# Patient Record
Sex: Male | Born: 1965
Health system: Southern US, Community
[De-identification: ages and names within clinical notes are randomized; demographics above are authoritative.]

## PROBLEM LIST (undated history)

## (undated) DIAGNOSIS — G4730 Sleep apnea, unspecified: Secondary | ICD-10-CM

## (undated) DIAGNOSIS — C61 Malignant neoplasm of prostate: Secondary | ICD-10-CM

## (undated) DIAGNOSIS — I10 Essential (primary) hypertension: Secondary | ICD-10-CM

## (undated) HISTORY — DX: Sleep apnea, unspecified: G47.30

## (undated) HISTORY — PX: PROSTATE BIOPSY: SHX241

---

## 1999-08-06 ENCOUNTER — Emergency Department (HOSPITAL_COMMUNITY): Admission: EM | Admit: 1999-08-06 | Discharge: 1999-08-06 | Payer: Self-pay | Admitting: Emergency Medicine

## 2004-07-11 ENCOUNTER — Ambulatory Visit (HOSPITAL_BASED_OUTPATIENT_CLINIC_OR_DEPARTMENT_OTHER): Admission: RE | Admit: 2004-07-11 | Discharge: 2004-07-11 | Payer: Self-pay | Admitting: Otolaryngology

## 2006-12-05 ENCOUNTER — Emergency Department (HOSPITAL_COMMUNITY): Admission: EM | Admit: 2006-12-05 | Discharge: 2006-12-05 | Payer: Self-pay | Admitting: Emergency Medicine

## 2010-05-07 ENCOUNTER — Encounter: Payer: Self-pay | Admitting: Pulmonary Disease

## 2010-05-07 DIAGNOSIS — G4733 Obstructive sleep apnea (adult) (pediatric): Secondary | ICD-10-CM | POA: Insufficient documentation

## 2010-05-10 ENCOUNTER — Telehealth: Payer: Self-pay | Admitting: Pulmonary Disease

## 2011-01-20 NOTE — Progress Notes (Signed)
Summary: nos appt  Phone Note Call from Patient   Caller: [email or web address]  Call For: clance Summary of Call: LMTCB x2 to rsc nos from 5/20. Initial call taken by: Darletta Moll,  May 10, 2010 2:44 PM

## 2011-01-20 NOTE — Miscellaneous (Signed)
Summary: Orders Update  Clinical Lists Changes  Orders: Added new Service order of No Show NS50 (NS50) - Signed 

## 2011-05-06 NOTE — Procedures (Signed)
NAME:  Bobby Mcguire, Bobby Mcguire             ACCOUNT NO.:  [id number]   MEDICAL RECORD NO.:  [id number]          PATIENT TYPE:  OUT   LOCATION:  SLEEP CENTER                 FACILITY:  [facility]   PHYSICIAN:  Clinton D. Maple Hudson, M.D. DATE OF BIRTH:  06/20/1966   DATE OF ADMISSION:  07/11/2004  DATE OF DISCHARGE:  07/11/2004                              NOCTURNAL POLYSOMNOGRAM   REFERRING PHYSICIAN:  Hermelinda Medicus, M.D.   INDICATIONS FOR STUDY:  Hypersomnia with sleep apnea.   EPWORTH SCORE:  5/24.   NECK SIZE:  19 inches.   BMI:  35.8.   WEIGHT:  280 pounds.   MEDICATIONS:  None listed.   SLEEP ARCHITECTURE:  Short total sleep time recorded 174 minutes for a sleep  efficiency of 46%.  This is adequate for evaluation but limited by lack of  sleep.  Patient's sleep questionnaire does not indicate a specific problem  effecting his sleep this night.  Stage I was 17%.  Stage II 76%.  Stages III  and IV were absent.  REM was 7% of total sleep time.  Latency to sleep onset  106 minutes.  Latency to REM 230 minutes.  Awake after sleep onset 93  minutes.  Arousal index was 78.4 times per hour, and most arousals were  fairly associated with respiratory events.   RESPIRATORY DATA:  NPSG protocol.  RDI 87 per hour, consistent with severe  obstructive sleep apnea/hypopnea syndrome.  There were 47 obstructive  hypopneas, 206 obstructive apneas, and one central apnea.  Events were not  positional.  RDI during REM was 91.2 per hour.   OXYGEN DATA:  Baseline room-air oxygen saturation was 97%.  Oxygen nadir was  75% during apneas with mean oxygen saturation through the study of 93-94%.  Snoring was moderate.   CARDIAC DATA:  Normal sinus rhythm.   MOVEMENT/PARASOMNIA:  Occasional body jerks caused insignificant additional  sleep disturbance.  Bathroom x2.   IMPRESSION/RECOMMENDATIONS:  1. Very severe obstructive sleep apnea/hypopnea syndrome, Respiratory     Disturbance Index 87 per hour.  2.  Moderate oxygen desaturation during apneas.  3. Normal cardiac rhythm.                                   ______________________________                                Rennis Chris. Maple Hudson, M.D.                                Diplomate, American Board of Sleep Medicine    CDY/MEDQ  D:  07/18/2004 11:24:20  T:  07/18/2004 18:22:17  Job:  [id number]

## 2014-02-25 ENCOUNTER — Ambulatory Visit (INDEPENDENT_AMBULATORY_CARE_PROVIDER_SITE_OTHER): Payer: BC Managed Care – PPO

## 2014-02-25 VITALS — BP 127/75 | HR 64 | Resp 16 | Ht 75.0 in | Wt 300.0 lb

## 2014-02-25 DIAGNOSIS — G621 Alcoholic polyneuropathy: Secondary | ICD-10-CM

## 2014-02-25 DIAGNOSIS — G629 Polyneuropathy, unspecified: Secondary | ICD-10-CM

## 2014-02-25 DIAGNOSIS — M79673 Pain in unspecified foot: Secondary | ICD-10-CM

## 2014-02-25 DIAGNOSIS — G608 Other hereditary and idiopathic neuropathies: Secondary | ICD-10-CM

## 2014-02-25 DIAGNOSIS — M79609 Pain in unspecified limb: Secondary | ICD-10-CM

## 2014-02-25 DIAGNOSIS — Q6650 Congenital pes planus, unspecified foot: Secondary | ICD-10-CM

## 2014-02-25 DIAGNOSIS — G609 Hereditary and idiopathic neuropathy, unspecified: Secondary | ICD-10-CM

## 2014-02-25 MED ORDER — MELOXICAM 15 MG PO TABS
15.0000 mg | ORAL_TABLET | Freq: Every day | ORAL | Status: DC
Start: 2014-02-25 — End: 2014-05-23

## 2014-02-25 NOTE — Patient Instructions (Signed)
ICE INSTRUCTIONS  Apply ice or cold pack to the affected area at least 3 times a day for 10-15 minutes each time.  You should also use ice after prolonged activity or vigorous exercise.  Do not apply ice longer than 20 minutes at one time.  Always keep a cloth between your skin and the ice pack to prevent burns.  Being consistent and following these instructions will help control your symptoms.  We suggest you purchase a gel ice pack because they are reusable and do bit leak.  Some of them are designed to wrap around the area.  Use the method that works best for you.  Here are some other suggestions for icing.   Use a frozen bag of peas or corn-inexpensive and molds well to your body, usually stays frozen for 10 to 20 minutes.  Wet a towel with cold water and squeeze out the excess until it's damp.  Place in a bag in the freezer for 20 minutes. Then remove and use.  Maintain a good stiff soled walking or athletic shoe.  Shoe recommendations: New balance 800 or higher, [place], [place], [place]

## 2014-02-25 NOTE — Progress Notes (Signed)
   Subjective:    Patient ID: Bobby Mcguire, male    DOB: 04/23/1966, 48 y.o.   MRN: [id number]  HPI Comments: "My feet are numb"  Patient c/o numbness through arch and toes bilateral for several months. Notices that his feet stay cold a lot. Today states that numbness is primarily in his big toes. Did get toenails x 3 removed 2 years ago by Urgent care and started to afterwards. Gotten worse recently. Supposed to see Dr Delora Fuel but had some insurance changes and never went. Went to [facility] to try orthotics, but couldn't tolerate. Stands and walks a lot with job. PCP did physical and checked glucose and was normal. Shoes feel better on.     Review of Systems  Neurological: Positive for numbness.  All other systems reviewed and are negative.       Objective:   Physical Exam Vascular status appears to be intact with pedal pulses palpable DP postal for PT +2/4 bilateral capillary refill time 3 seconds all digits skin temperature warm turgor normal there is no edema rubor pallor or varicosities neurologically epicritic and proprioceptive sensations appear to be intact on palpation patient does have some paresthesia burning or stinging sensation up in the central toes and hallux at times is actually place in the shoes sometimes worse without the shoes. Dermatologically skin color pigment normal hair growth absent nails somewhat criptotic and discolored and crit incurvated hallux nails previously been excised. Cement residual nail spicules present to orthopedic biomechanical exam rectus foot type ankle mid tarsus subtalar joint motions normal there is severe promontory changes with has valgus/pes planus deformity. There is mild DJD changes left first MTP area more so than right with a long first metatarsal decreased joint space noted on the left there is decreased range of motion dorsiflexion both hallux although this does not appear be associated with his symptomology are exacerbated  symptomology. Patient does admit to a history of alcohol use anywhere from 2-4 beers every day sometimes more than at exactly drinking a large appearing neck she them all with [name] which have a percent alcohol content. Patient [name] may have some issues with alcohol use sustained alcohol use. Patient also cases he stopped drinking couple weeks ago this was actually feeling less abnormal. X-rays otherwise unremarkable as noted again severe pes planus however there is no pain on percussion or palpation of the forefoot interspaces or in the tarsal canal innocuous tarsal tunnel symptoms related no history of back or sciatic problems       Assessment & Plan:  Assessment peripheral neuropathy possibly associated with alcohol use. May recommendations for MOBIC 50 mg twice daily samples of prescription dispensed patient will also with the alcohol stay well hydrated as alternative patient is also to wear shoes extremity worn and broken down need replacing he worsened 21 recommendations for shoes new athletic or walking shoes are recommended at this time he should also on [name] exam has some slight tinea pedis with interdigital maceration third and fourth webspaces dispensed samples of Luzu topical antifungal reappointed in one month for long-term followup for reevaluation patient been using Tinactin cream intact with break advised to continue with sprays into get new shoes alternating shoes frequently. Reappointed in one month for followup and reevaluation possible peripheral neuropathy/alcohol neuropathy.  Harriet Masson DPM

## 2014-03-25 ENCOUNTER — Ambulatory Visit: Payer: Self-pay

## 2014-04-04 ENCOUNTER — Ambulatory Visit: Payer: Self-pay

## 2014-05-23 ENCOUNTER — Other Ambulatory Visit: Payer: Self-pay

## 2014-05-26 NOTE — Telephone Encounter (Signed)
If pt continues to have problems, he needs to be seen.

## 2014-05-27 ENCOUNTER — Ambulatory Visit (INDEPENDENT_AMBULATORY_CARE_PROVIDER_SITE_OTHER): Payer: BC Managed Care – PPO | Admitting: Family Medicine

## 2014-05-27 VITALS — BP 128/80 | HR 65 | Temp 97.8°F | Resp 18 | Ht 75.0 in | Wt 302.2 lb

## 2014-05-27 DIAGNOSIS — M6530 Trigger finger, unspecified finger: Secondary | ICD-10-CM

## 2014-05-27 DIAGNOSIS — M1711 Unilateral primary osteoarthritis, right knee: Secondary | ICD-10-CM

## 2014-05-27 DIAGNOSIS — IMO0002 Reserved for concepts with insufficient information to code with codable children: Secondary | ICD-10-CM

## 2014-05-27 DIAGNOSIS — M1710 Unilateral primary osteoarthritis, unspecified knee: Secondary | ICD-10-CM

## 2014-05-27 DIAGNOSIS — M259 Joint disorder, unspecified: Secondary | ICD-10-CM

## 2014-05-27 DIAGNOSIS — E669 Obesity, unspecified: Secondary | ICD-10-CM

## 2014-05-27 LAB — COMPREHENSIVE METABOLIC PANEL
ALT: 22 U/L (ref 0–53)
AST: 18 U/L (ref 0–37)
Albumin: 4.5 g/dL (ref 3.5–5.2)
Alkaline Phosphatase: 54 U/L (ref 39–117)
BUN: 11 mg/dL (ref 6–23)
CO2: 27 mEq/L (ref 19–32)
Calcium: 9.7 mg/dL (ref 8.4–10.5)
Chloride: 105 mEq/L (ref 96–112)
Creat: 1.01 mg/dL (ref 0.50–1.35)
Glucose, Bld: 88 mg/dL (ref 70–99)
Potassium: 4.4 mEq/L (ref 3.5–5.3)
Sodium: 138 mEq/L (ref 135–145)
Total Bilirubin: 0.7 mg/dL (ref 0.2–1.2)
Total Protein: 7.2 g/dL (ref 6.0–8.3)

## 2014-05-27 LAB — LIPID PANEL
Cholesterol: 191 mg/dL (ref 0–200)
HDL: 36 mg/dL — ABNORMAL LOW (ref >39)
LDL Cholesterol: 145 mg/dL — ABNORMAL HIGH (ref 0–99)
Total CHOL/HDL Ratio: 5.3 Ratio
Triglycerides: 49 mg/dL (ref <150)
VLDL: 10 mg/dL (ref 0–40)

## 2014-05-27 MED ORDER — PREDNISONE 20 MG PO TABS: ORAL_TABLET | ORAL | Status: DC

## 2014-05-27 NOTE — Patient Instructions (Addendum)
He has several choices available to deal with the sore joints (right knee): Medication, injections, surgery, or exercise. Postexercise is spinning on a bicycle or StairMaster. He only needed to 10 minutes every day to get the benefit for the arthritis.  Bite stopping drinking alcohol, he stated the first step toward weight loss. Carbohydrates or your biggest enemy.  Trigger Finger Trigger finger (digital tendinitis and stenosing tenosynovitis) is a common disorder that causes an often painful catching of the fingers or thumb. It occurs as a clicking, snapping, or locking of a finger in the palm of the hand. This is caused by a problem with the tendons that flex or bend the fingers sliding smoothly through their sheaths. The condition may occur in any finger or a couple fingers at the same time.  The finger may lock with the finger curled or suddenly straighten out with a snap. This is more common in patients with rheumatoid arthritis and diabetes. Left untreated, the condition may get worse to the point where the finger becomes locked in flexion, like making a fist, or less commonly locked with the finger straightened out. CAUSES   Inflammation and scarring that lead to swelling around the tendon sheath.  Repeated or forceful movements.  Rheumatoid arthritis, an autoimmune disease that affects joints.  Gout.  Diabetes mellitus. SIGNS AND SYMPTOMS  Soreness and swelling of your finger.  A painful clicking or snapping as you bend and straighten your finger. DIAGNOSIS  Your health care provider will do a physical exam of your finger to diagnose trigger finger. TREATMENT   Splinting for 6 8 weeks may be helpful.  Nonsteroidal anti-inflammatory medicines (NSAIDs) can help to relieve the pain and inflammation.  Cortisone injections, along with splinting, may speed up recovery. Several injections may be required. Cortisone may give relief after one injection.  Surgery is another treatment  that may be used if conservative treatments do not work. Surgery can be minor, without incisions (a cut does not have to be made), and can be done with a needle through the skin.  Other surgical choices involve an open procedure in which the surgeon opens the hand through a small incision and cuts the pulley so the tendon can again slide smoothly. Your hand will still work fine. HOME CARE INSTRUCTIONS  Apply ice to the injured area, twice per day:  Put ice in a plastic bag.  Place a towel between your skin and the bag.  Leave the ice on for 20 minutes, 3 4 times a day.  Rest your hand often. MAKE SURE YOU:   Understand these instructions.  Will watch your condition.  Will get help right away if you are not doing well or get worse. Document Released: 09/24/2004 Document Revised: 08/07/2013 Document Reviewed: 05/07/2013 [facility] Patient Information 2014 [place].

## 2014-05-27 NOTE — Progress Notes (Signed)
° °  Subjective:  This chart was scribed for Robyn Haber, MD  by Stacy Gardner, [facility] Scribe. The patient was seen in room 10 and the patient's care was started at 9:04 AM.  Patient ID: Bobby Mcguire, male    DOB: 05-28-1966, 48 y.o.   MRN: [id number]  Hyperlipidemia Associated symptoms include myalgias.  Hand Pain   Arthritis He complains of joint swelling.   HPI Comments: Bobby Mcguire is a 48 y.o. male who arrives to the [facility] complaining of left thumb pain. He feels as though his joint is "catching" near the DIP. The pain is worse with bending and use of the joint. Nothing seems to help.   He also complains of right knee pain and swelling. Pt tried Meloxicam in the past for knee pain but he does not like the medicine.  Pt was told that he has a hx of advanced arthritis. 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 He reports working on a Ipad often. Pt was told that he is overweight and is trying to lose weight. He exercises via lifting weights and getting in sauna. Pt reports that he stop drinking and is eating healthier to lose weight.  He had a full physical last year.  Pt requests cholesterol test. He   Review of Systems  Musculoskeletal: Positive for arthralgias, arthritis, joint swelling and myalgias.       Objective:   Physical Exam  Filed Vitals:   05/27/14 0834  Pulse: 65  Temp: 97.8 F (36.6 C)  TempSrc: Oral  Resp: 18  Height: 6\' 3"  (1.905 m)  Weight: 302 lb 3.2 oz (137.077 kg)  SpO2: 99%    DIAGNOSTIC STUDIES: Oxygen Saturation is 99% on room air, normal by my interpretation.    COORDINATION OF CARE:  9:06 AM Discussed course of care with pt which includes . Advised pt to try to lose weight by doing activities like swimming, StairMaster, crunches, and spinning.  Recommended pt to refrain from carbohydrates, unhealthy starches and excessive sugar.The ideal weight to strive for is 250 lb.  Pt  understands and agrees.  Patient has no deformity of the left thumb. He does have slight catch at both the PIP and MCP joint  The right knee has a small amount of effusion but he has full range of motion. No point tenderness. Is no bony abnormality of the knee.  We spent 10-15 minutes discussing his general health and the need for losing weight.       Assessment & Plan:  Obesity, unspecified - Plan: Comprehensive metabolic panel, Lipid panel  Arthritis of knee, right - Plan: Comprehensive metabolic panel, Lipid panel, predniSONE (DELTASONE) 20 MG tablet  Trigger finger, acquired - Plan: predniSONE (DELTASONE) 20 MG tablet  Signed, Robyn Haber, MD

## 2014-06-23 ENCOUNTER — Other Ambulatory Visit: Payer: Self-pay | Admitting: Podiatry

## 2014-06-23 ENCOUNTER — Other Ambulatory Visit: Payer: Self-pay | Admitting: *Deleted

## 2014-07-02 ENCOUNTER — Telehealth: Payer: Self-pay

## 2014-07-02 DIAGNOSIS — N529 Male erectile dysfunction, unspecified: Secondary | ICD-10-CM

## 2014-07-02 NOTE — Telephone Encounter (Signed)
Dr Everlene Farrier   Patient is requesting viagra.  [facility] on [address].   [phone]

## 2014-07-03 NOTE — Telephone Encounter (Signed)
I cant see in epic when I saw him . Please check

## 2014-07-03 NOTE — Telephone Encounter (Signed)
Patient of Dr. Joseph Art. This has been forwarded.

## 2014-07-04 MED ORDER — SILDENAFIL CITRATE 100 MG PO TABS: 50.0000 mg | ORAL_TABLET | Freq: Every day | ORAL | Status: DC | PRN

## 2014-07-04 NOTE — Telephone Encounter (Signed)
Patient may want to consider checking testosterone level if viagra does not work

## 2014-08-22 ENCOUNTER — Ambulatory Visit (INDEPENDENT_AMBULATORY_CARE_PROVIDER_SITE_OTHER): Payer: BC Managed Care – PPO | Admitting: Family Medicine

## 2014-08-22 VITALS — BP 138/84 | HR 76 | Temp 97.4°F | Resp 18 | Ht 75.0 in | Wt 315.2 lb

## 2014-08-22 DIAGNOSIS — M6530 Trigger finger, unspecified finger: Secondary | ICD-10-CM

## 2014-08-22 DIAGNOSIS — R05 Cough: Secondary | ICD-10-CM

## 2014-08-22 DIAGNOSIS — IMO0002 Reserved for concepts with insufficient information to code with codable children: Secondary | ICD-10-CM

## 2014-08-22 DIAGNOSIS — N529 Male erectile dysfunction, unspecified: Secondary | ICD-10-CM

## 2014-08-22 DIAGNOSIS — S6992XS Unspecified injury of left wrist, hand and finger(s), sequela: Secondary | ICD-10-CM

## 2014-08-22 DIAGNOSIS — M1711 Unilateral primary osteoarthritis, right knee: Secondary | ICD-10-CM

## 2014-08-22 DIAGNOSIS — IMO0001 Reserved for inherently not codable concepts without codable children: Secondary | ICD-10-CM

## 2014-08-22 DIAGNOSIS — M1710 Unilateral primary osteoarthritis, unspecified knee: Secondary | ICD-10-CM

## 2014-08-22 DIAGNOSIS — Z23 Encounter for immunization: Secondary | ICD-10-CM

## 2014-08-22 DIAGNOSIS — R059 Cough, unspecified: Secondary | ICD-10-CM

## 2014-08-22 DIAGNOSIS — J3489 Other specified disorders of nose and nasal sinuses: Secondary | ICD-10-CM

## 2014-08-22 DIAGNOSIS — R0981 Nasal congestion: Secondary | ICD-10-CM

## 2014-08-22 DIAGNOSIS — G4733 Obstructive sleep apnea (adult) (pediatric): Secondary | ICD-10-CM

## 2014-08-22 MED ORDER — PREDNISONE 20 MG PO TABS: ORAL_TABLET | ORAL | Status: DC

## 2014-08-22 MED ORDER — SILDENAFIL CITRATE 100 MG PO TABS: 50.0000 mg | ORAL_TABLET | Freq: Every day | ORAL | Status: DC | PRN

## 2014-08-22 MED ORDER — HYDROCODONE-HOMATROPINE 5-1.5 MG/5ML PO SYRP: 5.0000 mL | ORAL_SOLUTION | Freq: Three times a day (TID) | ORAL | Status: DC | PRN

## 2014-08-22 NOTE — Patient Instructions (Addendum)
Sleep Apnea  Sleep apnea is a sleep disorder characterized by abnormal pauses in breathing while you sleep. When your breathing pauses, the level of oxygen in your blood decreases. This causes you to move out of deep sleep and into light sleep. As a result, your quality of sleep is poor, and the system that carries your blood throughout your body (cardiovascular system) experiences stress. If sleep apnea remains untreated, the following conditions can develop:  High blood pressure (hypertension).  Coronary artery disease.  Inability to achieve or maintain an erection (impotence).  Impairment of your thought process (cognitive dysfunction). There are three types of sleep apnea: 1. Obstructive sleep apnea--Pauses in breathing during sleep because of a blocked airway. 2. Central sleep apnea--Pauses in breathing during sleep because the area of the brain that controls your breathing does not send the correct signals to the muscles that control breathing. 3. Mixed sleep apnea--A combination of both obstructive and central sleep apnea. RISK FACTORS The following risk factors can increase your risk of developing sleep apnea:  Being overweight.  Smoking.  Having narrow passages in your nose and throat.  Being of older age.  Being male.  Alcohol use.  Sedative and tranquilizer use.  Ethnicity. Among individuals younger than 35 years, African Americans are at increased risk of sleep apnea. SYMPTOMS   Difficulty staying asleep.  Daytime sleepiness and fatigue.  Loss of energy.  Irritability.  Loud, heavy snoring.  Morning headaches.  Trouble concentrating.  Forgetfulness.  Decreased interest in sex. DIAGNOSIS  In order to diagnose sleep apnea, your caregiver will perform a physical examination. Your caregiver may suggest that you take a home sleep test. Your caregiver may also recommend that you spend the night in a sleep lab. In the sleep lab, several monitors record  information about your heart, lungs, and brain while you sleep. Your leg and arm movements and blood oxygen level are also recorded. TREATMENT The following actions may help to resolve mild sleep apnea:  Sleeping on your side.   Using a decongestant if you have nasal congestion.   Avoiding the use of depressants, including alcohol, sedatives, and narcotics.   Losing weight and modifying your diet if you are overweight. There also are devices and treatments to help open your airway:  Oral appliances. These are custom-made mouthpieces that shift your lower jaw forward and slightly open your bite. This opens your airway.  Devices that create positive airway pressure. This positive pressure "splints" your airway open to help you breathe better during sleep. The following devices create positive airway pressure:  Continuous positive airway pressure (CPAP) device. The CPAP device creates a continuous level of air pressure with an air pump. The air is delivered to your airway through a mask while you sleep. This continuous pressure keeps your airway open.  Nasal expiratory positive airway pressure (EPAP) device. The EPAP device creates positive air pressure as you exhale. The device consists of single-use valves, which are inserted into each nostril and held in place by adhesive. The valves create very little resistance when you inhale but create much more resistance when you exhale. That increased resistance creates the positive airway pressure. This positive pressure while you exhale keeps your airway open, making it easier to breath when you inhale again.  Bilevel positive airway pressure (BPAP) device. The BPAP device is used mainly in patients with central sleep apnea. This device is similar to the CPAP device because it also uses an air pump to deliver continuous air pressure  through a mask. However, with the BPAP machine, the pressure is set at two different levels. The pressure when you  exhale is lower than the pressure when you inhale.  Surgery. Typically, surgery is only done if you cannot comply with less invasive treatments or if the less invasive treatments do not improve your condition. Surgery involves removing excess tissue in your airway to create a wider passage way. Document Released: 11/25/2002 Document Revised: 04/01/2013 Document Reviewed: 04/12/2012 [facility] Patient Information 2015 [place], [place]. This information is not intended to replace advice given to you by your health care provider. Make sure you discuss any questions you have with your health care provider. Allergies Allergies may happen from anything your body is sensitive to. This may be food, medicines, pollens, chemicals, and nearly anything around you in everyday life that produces allergens. An allergen is anything that causes an allergy producing substance. Heredity is often a factor in causing these problems. This means you may have some of the same allergies as your parents. Food allergies happen in all age groups. Food allergies are some of the most severe and life threatening. Some common food allergies are cow's milk, seafood, eggs, nuts, wheat, and soybeans. SYMPTOMS   Swelling around the mouth.  An itchy red rash or hives.  Vomiting or diarrhea.  Difficulty breathing. SEVERE ALLERGIC REACTIONS ARE LIFE-THREATENING. This reaction is called anaphylaxis. It can cause the mouth and throat to swell and cause difficulty with breathing and swallowing. In severe reactions only a trace amount of food (for example, peanut oil in a salad) may cause death within seconds. Seasonal allergies occur in all age groups. These are seasonal because they usually occur during the same season every year. They may be a reaction to molds, grass pollens, or tree pollens. Other causes of problems are house dust mite allergens, pet dander, and mold spores. The symptoms often consist of nasal congestion, a runny itchy  nose associated with sneezing, and tearing itchy eyes. There is often an associated itching of the mouth and ears. The problems happen when you come in contact with pollens and other allergens. Allergens are the particles in the air that the body reacts to with an allergic reaction. This causes you to release allergic antibodies. Through a chain of events, these eventually cause you to release histamine into the blood stream. Although it is meant to be protective to the body, it is this release that causes your discomfort. This is why you were given anti-histamines to feel better. If you are unable to pinpoint the offending allergen, it may be determined by skin or blood testing. Allergies cannot be cured but can be controlled with medicine. Hay fever is a collection of all or some of the seasonal allergy problems. It may often be treated with simple over-the-counter medicine such as diphenhydramine. Take medicine as directed. Do not drink alcohol or drive while taking this medicine. Check with your caregiver or package insert for child dosages. If these medicines are not effective, there are many new medicines your caregiver can prescribe. Stronger medicine such as nasal spray, eye drops, and corticosteroids may be used if the first things you try do not work well. Other treatments such as immunotherapy or desensitizing injections can be used if all else fails. Follow up with your caregiver if problems continue. These seasonal allergies are usually not life threatening. They are generally more of a nuisance that can often be handled using medicine. HOME CARE INSTRUCTIONS   If unsure what causes  a reaction, keep a diary of foods eaten and symptoms that follow. Avoid foods that cause reactions.  If hives or rash are present:  Take medicine as directed.  You may use an over-the-counter antihistamine (diphenhydramine) for hives and itching as needed.  Apply cold compresses (cloths) to the skin or take  baths in cool water. Avoid hot baths or showers. Heat will make a rash and itching worse.  If you are severely allergic:  Following a treatment for a severe reaction, hospitalization is often required for closer follow-up.  Wear a medic-alert bracelet or necklace stating the allergy.  You and your family must learn how to give adrenaline or use an anaphylaxis kit.  If you have had a severe reaction, always carry your anaphylaxis kit or EpiPen with you. Use this medicine as directed by your caregiver if a severe reaction is occurring. Failure to do so could have a fatal outcome. SEEK MEDICAL CARE IF:  You suspect a food allergy. Symptoms generally happen within 30 minutes of eating a food.  Your symptoms have not gone away within 2 days or are getting worse.  You develop new symptoms.  You want to retest yourself or your child with a food or drink you think causes an allergic reaction. Never do this if an anaphylactic reaction to that food or drink has happened before. Only do this under the care of a caregiver. SEEK IMMEDIATE MEDICAL CARE IF:   You have difficulty breathing, are wheezing, or have a tight feeling in your chest or throat.  You have a swollen mouth, or you have hives, swelling, or itching all over your body.  You have had a severe reaction that has responded to your anaphylaxis kit or an EpiPen. These reactions may return when the medicine has worn off. These reactions should be considered life threatening. MAKE SURE YOU:   Understand these instructions.  Will watch your condition.  Will get help right away if you are not doing well or get worse. Document Released: 02/28/2003 Document Revised: 04/01/2013 Document Reviewed: 08/04/2008 [facility] Patient Information 2015 [place], [place]. This information is not intended to replace advice given to you by your health care provider. Make sure you discuss any questions you have with your health care  provider. Tendinitis Tendinitis is swelling and inflammation of the tendons. Tendons are band-like tissues that connect muscle to bone. Tendinitis commonly occurs in the:   Shoulders (rotator cuff).  Heels (Achilles tendon).  Elbows (triceps tendon). CAUSES Tendinitis is usually caused by overusing the tendon, muscles, and joints involved. When the tissue surrounding a tendon (synovium) becomes inflamed, it is called tenosynovitis. Tendinitis commonly develops in people whose jobs require repetitive motions. SYMPTOMS  Pain.  Tenderness.  Mild swelling. DIAGNOSIS Tendinitis is usually diagnosed by physical exam. Your health care provider may also order X-rays or other imaging tests. TREATMENT Your health care provider may recommend certain medicines or exercises for your treatment. HOME CARE INSTRUCTIONS   Use a sling or splint for as long as directed by your health care provider until the pain decreases.  Put ice on the injured area.  Put ice in a plastic bag.  Place a towel between your skin and the bag.  Leave the ice on for 15-20 minutes, 3-4 times a day, or as directed by your health care provider.  Avoid using the limb while the tendon is painful. Perform gentle range of motion exercises only as directed by your health care provider. Stop exercises if pain or discomfort  increase, unless directed otherwise by your health care provider.  Only take over-the-counter or prescription medicines for pain, discomfort, or fever as directed by your health care provider. SEEK MEDICAL CARE IF:   Your pain and swelling increase.  You develop new, unexplained symptoms, especially increased numbness in the hands. MAKE SURE YOU:   Understand these instructions.  Will watch your condition.  Will get help right away if you are not doing well or get worse. Document Released: 12/02/2000 Document Revised: 04/21/2014 Document Reviewed: 02/21/2011 [facility] Patient Information 2015  [place], [place]. This information is not intended to replace advice given to you by your health care provider. Make sure you discuss any questions you have with your health care provider. Tendinitis Tendinitis is swelling and inflammation of the tendons. Tendons are band-like tissues that connect muscle to bone. Tendinitis commonly occurs in the:   Shoulders (rotator cuff).  Heels (Achilles tendon).  Elbows (triceps tendon). CAUSES Tendinitis is usually caused by overusing the tendon, muscles, and joints involved. When the tissue surrounding a tendon (synovium) becomes inflamed, it is called tenosynovitis. Tendinitis commonly develops in people whose jobs require repetitive motions. SYMPTOMS  Pain.  Tenderness.  Mild swelling. DIAGNOSIS Tendinitis is usually diagnosed by physical exam. Your health care provider may also order X-rays or other imaging tests. TREATMENT Your health care provider may recommend certain medicines or exercises for your treatment. HOME CARE INSTRUCTIONS   Use a sling or splint for as long as directed by your health care provider until the pain decreases.  Put ice on the injured area.  Put ice in a plastic bag.  Place a towel between your skin and the bag.  Leave the ice on for 15-20 minutes, 3-4 times a day, or as directed by your health care provider.  Avoid using the limb while the tendon is painful. Perform gentle range of motion exercises only as directed by your health care provider. Stop exercises if pain or discomfort increase, unless directed otherwise by your health care provider.  Only take over-the-counter or prescription medicines for pain, discomfort, or fever as directed by your health care provider. SEEK MEDICAL CARE IF:   Your pain and swelling increase.  You develop new, unexplained symptoms, especially increased numbness in the hands. MAKE SURE YOU:   Understand these instructions.  Will watch your condition.  Will get help  right away if you are not doing well or get worse. Document Released: 12/02/2000 Document Revised: 04/21/2014 Document Reviewed: 02/21/2011 [facility] Patient Information 2015 [place], [place]. This information is not intended to replace advice given to you by your health care provider. Make sure you discuss any questions you have with your health care provider. Influenza Virus Vaccine injection (Fluarix) What is this medicine? INFLUENZA VIRUS VACCINE (in floo EN zuh VAHY ruhs vak SEEN) helps to reduce the risk of getting influenza also known as the flu. This medicine may be used for other purposes; ask your health care provider or pharmacist if you have questions. COMMON BRAND NAME(S): Fluarix, Fluzone What should I tell my health care provider before I take this medicine? They need to know if you have any of these conditions: -bleeding disorder like hemophilia -fever or infection -Guillain-Barre syndrome or other neurological problems -immune system problems -infection with the human immunodeficiency virus (HIV) or AIDS -low blood platelet counts -multiple sclerosis -an unusual or allergic reaction to influenza virus vaccine, eggs, chicken proteins, latex, gentamicin, other medicines, foods, dyes or preservatives -pregnant or trying to get pregnant -breast-feeding How  should I use this medicine? This vaccine is for injection into a muscle. It is given by a health care professional. A copy of Vaccine Information Statements will be given before each vaccination. Read this sheet carefully each time. The sheet may change frequently. Talk to your pediatrician regarding the use of this medicine in children. Special care may be needed. Overdosage: If you think you have taken too much of this medicine contact a poison control center or emergency room at once. NOTE: This medicine is only for you. Do not share this medicine with others. What if I miss a dose? This does not apply. What may interact  with this medicine? -chemotherapy or radiation therapy -medicines that lower your immune system like etanercept, anakinra, infliximab, and adalimumab -medicines that treat or prevent blood clots like warfarin -phenytoin -steroid medicines like prednisone or cortisone -theophylline -vaccines This list may not describe all possible interactions. Give your health care provider a list of all the medicines, herbs, non-prescription drugs, or dietary supplements you use. Also tell them if you smoke, drink alcohol, or use illegal drugs. Some items may interact with your medicine. What should I watch for while using this medicine? Report any side effects that do not go away within 3 days to your doctor or health care professional. Call your health care provider if any unusual symptoms occur within 6 weeks of receiving this vaccine. You may still catch the flu, but the illness is not usually as bad. You cannot get the flu from the vaccine. The vaccine will not protect against colds or other illnesses that may cause fever. The vaccine is needed every year. What side effects may I notice from receiving this medicine? Side effects that you should report to your doctor or health care professional as soon as possible: -allergic reactions like skin rash, itching or hives, swelling of the face, lips, or tongue Side effects that usually do not require medical attention (report to your doctor or health care professional if they continue or are bothersome): -fever -headache -muscle aches and pains -pain, tenderness, redness, or swelling at site where injected -weak or tired This list may not describe all possible side effects. Call your doctor for medical advice about side effects. You may report side effects to FDA at 1-800-FDA-1088. Where should I keep my medicine? This vaccine is only given in a clinic, pharmacy, doctor's office, or other health care setting and will not be stored at home. NOTE: This sheet is  a summary. It may not cover all possible information. If you have questions about this medicine, talk to your doctor, pharmacist, or health care provider.  2015, [employer]. (2008-07-02 09:30:40)

## 2014-08-22 NOTE — Addendum Note (Signed)
Addended by: Sheliah Plane on: 08/22/2014 02:26 PM   Modules accepted: Orders

## 2014-08-22 NOTE — Progress Notes (Signed)
Patient ID: Bobby Mcguire MRN: [id number], DOB: 08-Oct-1966, 48 y.o. Date of Encounter: 08/22/2014, 12:42 PM  This chart was scribed for Robyn Haber, MD by Cathie Hoops, ED Scribe. The patient was seen in Room 11. The patient's care was started at 12:42 PM.   Primary Physician: Nilda Simmer, MD  Chief Complaint: congestion,   HPI: 48 y.o. year old male with history below presents with moderate, gradually improving nasal congestion. Pt notes he uses a CPAP mask at night and states it was very difficult for him to sleep. He states several years ago, he previously had severe nasal congestion he received a steroid shot with relief. Pt has associated cough and rhinorrhea. Pt attempted OTC Tylenol Cold medication with no relief. Pt denies using Prednisone regularly, but notes he does find some relief. Pt previously saw Dr. Charna Busman for sleep apnea. Pt also notes some left hand pain near the base of his left thumb. Pt notes he constantly carries his iPad in his left hand for work. He notes a recent 15 pound weight gain due to his beer consumption during football season and fast food consumption.   Past Medical History  Diagnosis Date  . Sleep apnea      Home Meds: Prior to Admission medications   Medication Sig Start Date End Date Taking? Authorizing Provider  predniSONE (DELTASONE) 20 MG tablet 2 daily with food 05/27/14  Yes Robyn Haber, MD  sildenafil (VIAGRA) 100 MG tablet Take 0.5-1 tablets (50-100 mg total) by mouth daily as needed for erectile dysfunction. 07/04/14  Yes Robyn Haber, MD    Allergies: No Known Allergies  History   Social History  . Marital Status: Single    Spouse Name: N/A    Number of Children: N/A  . Years of Education: N/A   Occupational History  . Not on file.   Social History Main Topics  . Smoking status: Never Smoker   . Smokeless tobacco: Not on file  . Alcohol Use: No  . Drug Use: Not on file  . Sexual Activity: Not on file   Other  Topics Concern  . Not on file   Social History Narrative  . No narrative on file     Review of Systems: Constitutional: negative for chills, fever, night sweats, weight changes, or fatigue  HEENT: negative for vision changes, hearing loss, ST, epistaxis, or sinus pressure. Positive for congestion or rhinorrhea. Cardiovascular: negative for chest pain or palpitations Respiratory: negative for hemoptysis, wheezing, or shortness of breath. Positive for cough. Abdominal: negative for abdominal pain, nausea, vomiting, diarrhea, or constipation Dermatological: negative for rash Musculoskeletal: Arthralgias (right knee and left thumb) Neurologic: negative for headache, dizziness, or syncope All other systems reviewed and are otherwise negative with the exception to those above and in the HPI.   Physical Exam: Blood pressure 138/84, pulse 76, temperature 97.4 F (36.3 C), temperature source Oral, resp. rate 18, height 6\' 3"  (1.905 m), weight 315 lb 3.2 oz (142.974 kg), SpO2 97.00%., Body mass index is 39.4 kg/(m^2). General: Well developed, well nourished, in no acute distress. Head: Normocephalic, atraumatic, eyes without discharge, sclera non-icteric, nares are without discharge. Bilateral auditory canals clear, TM's are without perforation, pearly grey and translucent with reflective cone of light bilaterally. Oral cavity moist, posterior pharynx without exudate, erythema, peritonsillar abscess, or post nasal drip.  Neck: Supple. No thyromegaly. Full ROM. No lymphadenopathy. Lungs: Clear bilaterally to auscultation without wheezes, rales, or rhonchi. Breathing is unlabored. Heart: RRR with S1 S2. No  murmurs, rubs, or gallops appreciated. Abdomen: Soft, non-tender, non-distended with normoactive bowel sounds. No hepatomegaly. No rebound/guarding. No obvious abdominal masses. Msk:  Strength and tone normal for age. Extremities/Skin: Warm and dry. No clubbing or cyanosis. No edema. No rashes or  suspicious lesions. Neuro: Alert and oriented X 3. Moves all extremities spontaneously. Gait is normal. CNII-XII grossly in tact. Psych:  Responds to questions appropriately with a normal affect.   Patient has good range of motion of the left thumb although it is mildly tender at the MCP joint. He has moderate nasal obstruction with swollen nasal mucosa   ASSESSMENT AND PLAN:  12:52 PM- Patient informed of current plan for treatment and evaluation and agrees with plan at this time.  48 y.o. year old male with Nasal congestion - Plan: predniSONE (DELTASONE) 20 MG tablet  Cough - Plan: HYDROcodone-homatropine (HYCODAN) 5-1.5 MG/5ML syrup  Thumb injury, left, sequela - Plan: predniSONE (DELTASONE) 20 MG tablet  Arthritis of knee, right - Plan: predniSONE (DELTASONE) 20 MG tablet  Trigger finger, acquired - Plan: predniSONE (DELTASONE) 20 MG tablet  Erectile dysfunction, unspecified erectile dysfunction type - Plan: sildenafil (VIAGRA) 100 MG tablet  Obstructive sleep apnea - Plan: Ambulatory referral to ENT     Signed, Robyn Haber, MD 08/22/2014 12:52 PM

## 2014-09-01 ENCOUNTER — Telehealth: Payer: Self-pay

## 2014-09-01 NOTE — Telephone Encounter (Signed)
Pt states he needs refill for nasal infection?   Best phone for pt is [phone]   Pharmacy [facility]

## 2014-09-02 NOTE — Telephone Encounter (Signed)
LM pt needs to RTC

## 2014-10-01 ENCOUNTER — Ambulatory Visit (INDEPENDENT_AMBULATORY_CARE_PROVIDER_SITE_OTHER): Payer: BC Managed Care – PPO | Admitting: Family Medicine

## 2014-10-01 VITALS — BP 142/88 | HR 69 | Temp 98.7°F | Resp 16 | Ht 75.0 in | Wt 330.6 lb

## 2014-10-01 DIAGNOSIS — Z131 Encounter for screening for diabetes mellitus: Secondary | ICD-10-CM

## 2014-10-01 DIAGNOSIS — B351 Tinea unguium: Secondary | ICD-10-CM

## 2014-10-01 DIAGNOSIS — B370 Candidal stomatitis: Secondary | ICD-10-CM

## 2014-10-01 DIAGNOSIS — L600 Ingrowing nail: Secondary | ICD-10-CM

## 2014-10-01 DIAGNOSIS — R202 Paresthesia of skin: Secondary | ICD-10-CM

## 2014-10-01 LAB — POCT GLYCOSYLATED HEMOGLOBIN (HGB A1C): Hemoglobin A1C: 4.9

## 2014-10-01 MED ORDER — HYDROCODONE-ACETAMINOPHEN 5-325 MG PO TABS: 1.0000 | ORAL_TABLET | Freq: Four times a day (QID) | ORAL | Status: DC | PRN

## 2014-10-01 MED ORDER — NYSTATIN 100000 UNIT/ML MT SUSP: 5.0000 mL | Freq: Four times a day (QID) | OROMUCOSAL | Status: DC

## 2014-10-01 NOTE — Patient Instructions (Signed)
Onychomycosis/Fungal Toenails  WHAT IS IT? An infection that lies within the keratin of your nail plate that is caused by a fungus.  WHY ME? Fungal infections affect all ages, sexes, races, and creeds.  There may be many factors that predispose you to a fungal infection such as age, coexisting medical conditions such as diabetes, or an autoimmune disease; stress, medications, fatigue, genetics, etc.  Bottom line: fungus thrives in a warm, moist environment and your shoes offer such a location.  IS IT CONTAGIOUS? Theoretically, yes.  You do not want to share shoes, nail clippers or files with someone who has fungal toenails.  Walking around barefoot in the same room or sleeping in the same bed is unlikely to transfer the organism.  It is important to realize, however, that fungus can spread easily from one nail to the next on the same foot.  HOW DO WE TREAT THIS?  There are several ways to treat this condition.  Treatment may depend on many factors such as age, medications, pregnancy, liver and kidney conditions, etc.  It is best to ask your doctor which options are available to you.  1. No treatment.   Unlike many other medical concerns, you can live with this condition.  However for many people this can be a painful condition and may lead to ingrown toenails or a bacterial infection.  It is recommended that you keep the nails cut short to help reduce the amount of fungal nail. 2. Topical treatment.  These range from herbal remedies to prescription strength nail lacquers.  About 40-50% effective, topicals require twice daily application for approximately 9 to 12 months or until an entirely new nail has grown out.  The most effective topicals are medical grade medications available through physicians offices. 3. Oral antifungal medications.  With an 80-90% cure rate, the most common oral medication requires 3 to 4 months of therapy and stays in your system for a year as the new nail grows out.  Oral  antifungal medications do require blood work to make sure it is a safe drug for you.  A liver function panel will be performed prior to starting the medication and after the first month of treatment.  It is important to have the blood work performed to avoid any harmful side effects.  In general, this medication safe but blood work is required. 4. Laser Therapy.  This treatment is performed by applying a specialized laser to the affected nail plate.  This therapy is noninvasive, fast, and non-painful.  It is not covered by insurance and is therefore, out of pocket.  The results have been very good with a 80-95% cure rate.  [facility] is the only practice in the area to offer this therapy. 5. Permanent Nail Avulsion.  Removing the entire nail so that a new nail will not grow back. 

## 2014-10-01 NOTE — Progress Notes (Signed)
Procedure: Verbal consent obtained.  Patient was prepped with alcohol and anesthetized with 2% lidocaine without epi on the medial border at the base of the second toe, at the base of second and third toe, and the base of the lateral border of the fourth toe.  The patient was then prepped with iodine, and toe nails 2, 3, and 4 were lifted and removed. The patient tolerated the procedure well.  Xeroform was placed over each nail bed along with a non adherent dressing, and a cotton wrap was placed on each toe.   The dressing was topped with coban.    Philis Fendt, MS, PA-C 10:22 AM

## 2014-10-01 NOTE — Progress Notes (Signed)
Chief Complaint:  Chief Complaint  Patient presents with  . Ingrown Toenail  . Medication Refill    Nystatin suspension    HPI: Bobby Mcguire is a 48 y.o. male who is here for: 1. Right ingrown toe nails, recurrent, lots of pain , swellliing 2-3 toes, he has some tingling in his toes as well, deneis DM. He has been to podiatry but does not want to go back to [place], did not like the podiatrist there. He has tried cutting the nails himself but he has had pain and just wants the toes nails removed. Denies any PF, he has no pain at the bottom of his foot.  2. Nystatin for thrush since he bites the sides of his mouth, he was rx this the last time and it improved drastically   He is in Sales at [employer]  Past Medical History  Diagnosis Date  . Sleep apnea    History reviewed. No pertinent past surgical history. History   Social History  . Marital Status: Single    Spouse Name: N/A    Number of Children: N/A  . Years of Education: N/A   Social History Main Topics  . Smoking status: Never Smoker   . Smokeless tobacco: None  . Alcohol Use: No  . Drug Use: None  . Sexual Activity: None   Other Topics Concern  . None   Social History Narrative  . None   History reviewed. No pertinent family history. No Known Allergies Prior to Admission medications   Medication Sig Start Date End Date Taking? Authorizing Provider  sildenafil (VIAGRA) 100 MG tablet Take 0.5-1 tablets (50-100 mg total) by mouth daily as needed for erectile dysfunction. 08/22/14  Yes Robyn Haber, MD  HYDROcodone-homatropine [facility]) 5-1.5 MG/5ML syrup Take 5 mLs by mouth every 8 (eight) hours as needed for cough. 08/22/14   Robyn Haber, MD  predniSONE (DELTASONE) 20 MG tablet 2 daily with food 08/22/14   Robyn Haber, MD     ROS: The patient denies fevers, chills, night sweats, unintentional weight loss, chest pain, palpitations, wheezing, dyspnea on exertion, nausea,  vomiting, abdominal pain, dysuria, hematuria, melena,  Weakness, + numbness, or tingling.   All other systems have been reviewed and were otherwise negative with the exception of those mentioned in the HPI and as above.    PHYSICAL EXAM: Filed Vitals:   10/01/14 0815  BP: 142/88  Pulse: 69  Temp: 98.7 F (37.1 C)  Resp: 16   Filed Vitals:   10/01/14 0815  Height: 6\' 3"  (1.905 m)  Weight: 330 lb 9.6 oz (149.959 kg)   Body mass index is 41.32 kg/(m^2).  General: Alert, no acute distress HEENT:  Normocephalic, atraumatic, oropharynx patent. EOMI, PERRLA Cardiovascular:  Regular rate and rhythm, no rubs murmurs or gallops.  No Carotid bruits, radial pulse intact. No pedal edema.  Respiratory: Clear to auscultation bilaterally.  No wheezes, rales, or rhonchi.  No cyanosis, no use of accessory musculature GI: No organomegaly, abdomen is soft and non-tender, positive bowel sounds.  No masses. Skin: + onychomycosis and right ingrown toe nail 2nd, 3rd and 4rth toes nails. Good brisk DP and also ROM and 5/5 strength  Neurologic: Facial musculature symmetric. Psychiatric: Patient is appropriate throughout our interaction. Lymphatic: No cervical lymphadenopathy Musculoskeletal: Gait intact.   LABS: Results for orders placed in visit on 10/01/14  POCT GLYCOSYLATED HEMOGLOBIN (HGB A1C)      Result Value Ref Range  Hemoglobin A1C 4.9       EKG/XRAY:   Primary read interpreted by Dr. Marin Comment at [facility].   ASSESSMENT/PLAN: Encounter Diagnoses  Name Primary?  Marland Kitchen Onychomycosis Yes  . Onychomycosis with ingrown toenail   . Oral thrush   . Paresthesia of right foot    Toe nail removal without complications Wound care as directed Rx Norco for pain  Does not have DM, will need to readdress tingling in toes if toes nail removal does not improve sxs, possibley realted to pain and subsequent swelling causing him to have numbenss and tingling but not sure F/u prn   Gross sideeffects, risk and  benefits, and alternatives of medications d/w patient. Patient is aware that all medications have potential sideeffects and we are unable to predict every sideeffect or drug-drug interaction that may occur.  Jerri Hargadon, [place], DO 10/01/2014 9:18 AM

## 2014-11-03 ENCOUNTER — Ambulatory Visit (INDEPENDENT_AMBULATORY_CARE_PROVIDER_SITE_OTHER): Payer: BC Managed Care – PPO | Admitting: Emergency Medicine

## 2014-11-03 VITALS — BP 142/80 | HR 86 | Temp 98.1°F | Resp 16 | Ht 75.75 in | Wt 330.0 lb

## 2014-11-03 DIAGNOSIS — R0981 Nasal congestion: Secondary | ICD-10-CM

## 2014-11-03 DIAGNOSIS — R059 Cough, unspecified: Secondary | ICD-10-CM

## 2014-11-03 DIAGNOSIS — R05 Cough: Secondary | ICD-10-CM

## 2014-11-03 MED ORDER — AZELASTINE HCL 0.1 % NA SOLN: 2.0000 | Freq: Two times a day (BID) | NASAL | Status: DC

## 2014-11-03 MED ORDER — PREDNISONE 10 MG PO TABS: ORAL_TABLET | ORAL | Status: DC

## 2014-11-03 MED ORDER — ALBUTEROL SULFATE (2.5 MG/3ML) 0.083% IN NEBU
2.5000 mg | INHALATION_SOLUTION | Freq: Once | RESPIRATORY_TRACT | Status: AC
  Administered 2014-11-03: 2.5 mg via RESPIRATORY_TRACT

## 2014-11-03 MED ORDER — HYDROCODONE-HOMATROPINE 5-1.5 MG/5ML PO SYRP: 5.0000 mL | ORAL_SOLUTION | Freq: Three times a day (TID) | ORAL | Status: DC | PRN

## 2014-11-03 NOTE — Patient Instructions (Signed)
Upper Respiratory Infection, Adult An upper respiratory infection (URI) is also sometimes known as the common cold. The upper respiratory tract includes the nose, sinuses, throat, trachea, and bronchi. Bronchi are the airways leading to the lungs. Most people improve within 1 week, but symptoms can last up to 2 weeks. A residual cough may last even longer.  CAUSES Many different viruses can infect the tissues lining the upper respiratory tract. The tissues become irritated and inflamed and often become very moist. Mucus production is also common. A cold is contagious. You can easily spread the virus to others by oral contact. This includes kissing, sharing a glass, coughing, or sneezing. Touching your mouth or nose and then touching a surface, which is then touched by another person, can also spread the virus. SYMPTOMS  Symptoms typically develop 1 to 3 days after you come in contact with a cold virus. Symptoms vary from person to person. They may include:  Runny nose.  Sneezing.  Nasal congestion.  Sinus irritation.  Sore throat.  Loss of voice (laryngitis).  Cough.  Fatigue.  Muscle aches.  Loss of appetite.  Headache.  Low-grade fever. DIAGNOSIS  You might diagnose your own cold based on familiar symptoms, since most people get a cold 2 to 3 times a year. Your caregiver can confirm this based on your exam. Most importantly, your caregiver can check that your symptoms are not due to another disease such as strep throat, sinusitis, pneumonia, asthma, or epiglottitis. Blood tests, throat tests, and X-rays are not necessary to diagnose a common cold, but they may sometimes be helpful in excluding other more serious diseases. Your caregiver will decide if any further tests are required. RISKS AND COMPLICATIONS  You may be at risk for a more severe case of the common cold if you smoke cigarettes, have chronic heart disease (such as heart failure) or lung disease (such as asthma), or if  you have a weakened immune system. The very young and very old are also at risk for more serious infections. Bacterial sinusitis, middle ear infections, and bacterial pneumonia can complicate the common cold. The common cold can worsen asthma and chronic obstructive pulmonary disease (COPD). Sometimes, these complications can require emergency medical care and may be life-threatening. PREVENTION  The best way to protect against getting a cold is to practice good hygiene. Avoid oral or hand contact with people with cold symptoms. Wash your hands often if contact occurs. There is no clear evidence that vitamin C, vitamin E, echinacea, or exercise reduces the chance of developing a cold. However, it is always recommended to get plenty of rest and practice good nutrition. TREATMENT  Treatment is directed at relieving symptoms. There is no cure. Antibiotics are not effective, because the infection is caused by a virus, not by bacteria. Treatment may include:  Increased fluid intake. Sports drinks offer valuable electrolytes, sugars, and fluids.  Breathing heated mist or steam (vaporizer or shower).  Eating chicken soup or other clear broths, and maintaining good nutrition.  Getting plenty of rest.  Using gargles or lozenges for comfort.  Controlling fevers with ibuprofen or acetaminophen as directed by your caregiver.  Increasing usage of your inhaler if you have asthma. Zinc gel and zinc lozenges, taken in the first 24 hours of the common cold, can shorten the duration and lessen the severity of symptoms. Pain medicines may help with fever, muscle aches, and throat pain. A variety of non-prescription medicines are available to treat congestion and runny nose. Your caregiver   can make recommendations and may suggest nasal or lung inhalers for other symptoms.  HOME CARE INSTRUCTIONS   Only take over-the-counter or prescription medicines for pain, discomfort, or fever as directed by your  caregiver.  Use a warm mist humidifier or inhale steam from a shower to increase air moisture. This may keep secretions moist and make it easier to breathe.  Drink enough water and fluids to keep your urine clear or pale yellow.  Rest as needed.  Return to work when your temperature has returned to normal or as your caregiver advises. You may need to stay home longer to avoid infecting others. You can also use a face mask and careful hand washing to prevent spread of the virus. SEEK MEDICAL CARE IF:   After the first few days, you feel you are getting worse rather than better.  You need your caregiver's advice about medicines to control symptoms.  You develop chills, worsening shortness of breath, or brown or red sputum. These may be signs of pneumonia.  You develop yellow or brown nasal discharge or pain in the face, especially when you bend forward. These may be signs of sinusitis.  You develop a fever, swollen neck glands, pain with swallowing, or white areas in the back of your throat. These may be signs of strep throat. SEEK IMMEDIATE MEDICAL CARE IF:   You have a fever.  You develop severe or persistent headache, ear pain, sinus pain, or chest pain.  You develop wheezing, a prolonged cough, cough up blood, or have a change in your usual mucus (if you have chronic lung disease).  You develop sore muscles or a stiff neck. Document Released: 05/31/2001 Document Revised: 02/27/2012 Document Reviewed: 03/12/2014 ExitCare Patient Information 2015 [employer]. This information is not intended to replace advice given to you by your health care provider. Make sure you discuss any questions you have with your health care provider.  

## 2014-11-03 NOTE — Progress Notes (Signed)
   Subjective:  This chart was scribed for Bobby Mcguire A. Bobby Farrier, MD by [name], Medical scribe. This patient was seen in ROOM 10 and the patient's care was started 8:14 AM.  Patient ID: Bobby Mcguire, male    DOB: 06-14-1966, 48 y.o.   MRN: [id number]  HPI HPI Comments: Bobby Mcguire is a 48 y.o. male who presents to [facility] complaining of intermitent, unproductive cough for the last 3 days. Patient reports associated nasal drip and sinus congestion with yellow/redish sputum. He says he thinks he has an upper respiratory infection. Patient reports he uses a CPAP machine but has not been able to due to his symptoms and reports associated sleep disturbance. He says he has tried Afrin which has failed to provide relief to his symptoms. 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 He denies a history of smoking. He denies fever.   Chief Complaint  Patient presents with  . Cough    Worse at night, X Friday  . Sinus Congestion    X Friday, having yellow/redish sputum    Past Medical History  Diagnosis Date  . Sleep apnea    No Known Allergies Current Outpatient Prescriptions on File Prior to Visit  Medication Sig Dispense Refill  . HYDROcodone-acetaminophen (NORCO) 5-325 MG per tablet Take 1 tablet by mouth every 6 (six) hours as needed for moderate pain. 30 tablet 0  . nystatin (MYCOSTATIN) 100000 UNIT/ML suspension Take 5 mLs (500,000 Units total) by mouth 4 (four) times daily. 60 mL 0  . sildenafil (VIAGRA) 100 MG tablet Take 0.5-1 tablets (50-100 mg total) by mouth daily as needed for erectile dysfunction. 5 tablet 11   No current facility-administered medications on file prior to visit.    History reviewed. No pertinent family history.  Filed Vitals:   11/03/14 0810  BP: 142/80  Pulse: 86  Temp: 98.1 F (36.7 C)  Resp: 16    Review of Systems  Constitutional: Negative for fever.  HENT: Positive for congestion, postnasal drip and sinus pressure.   Respiratory: Positive for  cough.        Objective:  Physical Exam  Constitutional: He is oriented to person, place, and time. He appears well-developed and well-nourished. No distress.  HENT:  Head: Normocephalic and atraumatic.  Nasal turbinates sare swollen and congested.   Neck: Normal range of motion. Neck supple. No tracheal deviation present.  Healed scar in the neck.   Cardiovascular: Normal rate.   Pulmonary/Chest: Effort normal. No respiratory distress. He has no wheezes.  Frequent episodes of cough. Mild prolongation of expiration but no true wheezes.    Abdominal: He exhibits no distension.  Musculoskeletal: Normal range of motion.  Neurological: He is alert and oriented to person, place, and time.  Skin: Skin is warm and dry.  Psychiatric: He has a normal mood and affect. His behavior is normal.  Nursing note and vitals reviewed.      Assessment & Plan:  Pt given albuterol nebulizer treatment. Peak flow before nebulizer was 525; should be 527. Peak flow after nebulizer was 550. Treat with prednisone and Hycodan cough syrup. He was given Astelin nasal spray for his nasal congestion. He has significant problems with his CPAP machine.Marland Kitchen

## 2014-11-05 ENCOUNTER — Telehealth: Payer: Self-pay

## 2014-11-05 NOTE — Telephone Encounter (Signed)
Patient came in to leave a form for Dr. Everlene Farrier to fill out. Form has been put in Dr. Caren Griffins box. Please call patient when ready for pick up. Patient PHQNE:[phone]

## 2014-11-06 NOTE — Telephone Encounter (Signed)
Form was faxed for patient, ready to be picked up now. Copy sent for scanning

## 2014-11-11 ENCOUNTER — Ambulatory Visit (INDEPENDENT_AMBULATORY_CARE_PROVIDER_SITE_OTHER): Payer: BC Managed Care – PPO | Admitting: Family Medicine

## 2014-11-11 VITALS — BP 124/76 | HR 75 | Temp 98.2°F | Resp 17 | Ht 76.5 in | Wt 333.0 lb

## 2014-11-11 DIAGNOSIS — R05 Cough: Secondary | ICD-10-CM

## 2014-11-11 DIAGNOSIS — R053 Chronic cough: Secondary | ICD-10-CM

## 2014-11-11 DIAGNOSIS — J208 Acute bronchitis due to other specified organisms: Secondary | ICD-10-CM

## 2014-11-11 MED ORDER — HYDROCOD POLST-CHLORPHEN POLST 10-8 MG/5ML PO LQCR: 5.0000 mL | Freq: Two times a day (BID) | ORAL | Status: DC | PRN

## 2014-11-11 MED ORDER — DOXYCYCLINE HYCLATE 100 MG PO CAPS: 100.0000 mg | ORAL_CAPSULE | Freq: Two times a day (BID) | ORAL | Status: DC

## 2014-11-11 NOTE — Patient Instructions (Signed)
We are going to try a stronger type of cough syrup- you can use it twice a day.  Also use the antibiotic (doxycycline) twice a day

## 2014-11-11 NOTE — Progress Notes (Signed)
[facility] [address] [place] [address] [phone]  Date:  11/11/2014   Name:  Bobby Mcguire   DOB:  09/11/1966   MRN:  [id number]  PCP:  Nilda Simmer, MD    Chief Complaint: Cough   History of Present Illness:  Bobby Mcguire is a 48 y.o. very pleasant male patient who presents with the following:  Here today with a cough.  He continues to have a cough. He was seen here on 11/16; was treated with prednisone, hycodan, astelin .  He does not feel that the hycodan is very helpful He has not noted any wheezing. He has been coughing for 10 days now total.  He has not noted a fever.  No chills or aches.   The cough is generally dry.  He may occasionally cough up some yellow mucus.  No GI symptoms.   He uses cpap for OSA.  He would like a stronger cough syrup so he can rest and get his treatment for OSA  Patient Active Problem List   Diagnosis Date Noted  . OBSTRUCTIVE SLEEP APNEA 05/07/2010    Past Medical History  Diagnosis Date  . Sleep apnea     No past surgical history on file.  History  Substance Use Topics  . Smoking status: Never Smoker   . Smokeless tobacco: Not on file  . Alcohol Use: No    No family history on file.  No Known Allergies  Medication list has been reviewed and updated.  Current Outpatient Prescriptions on File Prior to Visit  Medication Sig Dispense Refill  . azelastine (ASTELIN) 0.1 % nasal spray Place 2 sprays into both nostrils 2 (two) times daily. Use in each nostril as directed 30 mL 12  . HYDROcodone-acetaminophen (NORCO) 5-325 MG per tablet Take 1 tablet by mouth every 6 (six) hours as needed for moderate pain. 30 tablet 0  . HYDROcodone-homatropine (HYCODAN) 5-1.5 MG/5ML syrup Take 5 mLs by mouth every 8 (eight) hours as needed for cough. 120 mL 0  . nystatin (MYCOSTATIN) 100000 UNIT/ML suspension Take 5 mLs (500,000 Units total) by mouth 4 (four) times daily. 60 mL 0  . predniSONE (DELTASONE) 10 MG  tablet Take 4 day for 3 days 3 a day for 3 days 2 a day for 3 days 1 a day for 3 days 30 tablet 0  . sildenafil (VIAGRA) 100 MG tablet Take 0.5-1 tablets (50-100 mg total) by mouth daily as needed for erectile dysfunction. 5 tablet 11   No current facility-administered medications on file prior to visit.    Review of Systems:  As per HPI- otherwise negative.   Physical Examination: Filed Vitals:   11/11/14 1237  BP: 124/76  Pulse: 100  Temp: 98.2 F (36.8 C)  Resp: 17   Filed Vitals:   11/11/14 1237  Height: 6' 4.5" (1.943 m)  Weight: 333 lb (151.048 kg)   Body mass index is 40.01 kg/(m^2). Ideal Body Weight: Weight in (lb) to have BMI = 25: 207.7  GEN: WDWN, NAD, Non-toxic, A & O x 3, obese, large build, coughing in room HEENT: Atraumatic, Normocephalic. Neck supple. No masses, No LAD.  Bilateral TM wnl, oropharynx normal.  PEERL,EOMI.   Nasal cavity is ok Ears and Nose: No external deformity. CV: RRR, No M/G/R. No JVD. No thrill. No extra heart sounds. PULM: CTA B, no wheezes, crackles, rhonchi. No retractions. No resp. distress. No accessory muscle use. EXTR: No c/c/e NEURO Normal gait.  PSYCH: Normally interactive. Conversant. Not depressed or anxious appearing.  Calm demeanor.    Assessment and Plan: Persistent cough - Plan: doxycycline (VIBRAMYCIN) 100 MG capsule  Acute bronchitis due to other specified organisms - Plan: chlorpheniramine-HYDROcodone (TUSSIONEX PENNKINETIC ER) 10-8 MG/5ML LQCR, doxycycline (VIBRAMYCIN) 100 MG capsule  Treat for persistent cough for 10 days with doxycycline and tussionex . Cautioned not to drive on tussionex and not to combine with his other cough syrup.    Meds ordered this encounter  Medications  . chlorpheniramine-HYDROcodone (TUSSIONEX PENNKINETIC ER) 10-8 MG/5ML LQCR    Sig: Take 5 mLs by mouth every 12 (twelve) hours as needed for cough.    Dispense:  90 mL    Refill:  0  . doxycycline (VIBRAMYCIN) 100 MG capsule    Sig:  Take 1 capsule (100 mg total) by mouth 2 (two) times daily.    Dispense:  20 capsule    Refill:  0    Signed Lamar Blinks, MD

## 2014-11-27 ENCOUNTER — Telehealth: Payer: Self-pay

## 2014-11-27 NOTE — Telephone Encounter (Signed)
Called and left detailed message on machine.  I noticed that he got hycodan in September, vicodin in October and tussionex in November.  I don't think we should continue to use narcotics at this time due to the danger of dependence.  If he is still having problems with severe cough we are glad to look at him again and consider other treatment options  Asked him to give me a call back, send a mychart message or come for a visit if any concerns

## 2014-11-27 NOTE — Telephone Encounter (Signed)
Pt. Came in today stating that he needs his chlorpheniramine-HYDROcodone (TUSSIONEX PENNKINETIC ER) 10-8 MG/5ML [facility] refilled he stated he is out and needs it again.  He would liked to be called back at [phone]

## 2014-11-28 ENCOUNTER — Ambulatory Visit (INDEPENDENT_AMBULATORY_CARE_PROVIDER_SITE_OTHER): Payer: BC Managed Care – PPO

## 2014-11-28 ENCOUNTER — Ambulatory Visit (INDEPENDENT_AMBULATORY_CARE_PROVIDER_SITE_OTHER): Payer: BC Managed Care – PPO | Admitting: Family Medicine

## 2014-11-28 VITALS — BP 126/72 | HR 77 | Temp 98.0°F | Resp 18 | Ht 76.0 in | Wt 330.0 lb

## 2014-11-28 DIAGNOSIS — J208 Acute bronchitis due to other specified organisms: Secondary | ICD-10-CM

## 2014-11-28 DIAGNOSIS — R053 Chronic cough: Secondary | ICD-10-CM

## 2014-11-28 DIAGNOSIS — R05 Cough: Secondary | ICD-10-CM

## 2014-11-28 MED ORDER — AZITHROMYCIN 250 MG PO TABS: ORAL_TABLET | ORAL | Status: DC

## 2014-11-28 MED ORDER — HYDROCOD POLST-CHLORPHEN POLST 10-8 MG/5ML PO LQCR: 5.0000 mL | Freq: Two times a day (BID) | ORAL | Status: DC | PRN

## 2014-11-28 NOTE — Patient Instructions (Signed)
Your chest x-ray looks good. You likely have a cough following illness- this is common and generally does not mean anything more serious If you like you can try taking the azithromycin (another antibiotic)- you would take this for 5 days I will give you a little more of the cough syrup- however I think this should be the last refill.  I am worried that you could develop a dependence on these medications with any more prolonged use.

## 2014-11-28 NOTE — Progress Notes (Signed)
[facility] [address], [place] [address] [phone]  Date:  11/28/2014   Name:  Bobby Mcguire   DOB:  [birth year]   MRN:  [id number]  PCP:  Nilda Simmer, MD    Chief Complaint: Cough   History of Present Illness:  Bobby Mcguire is a 48 y.o. very pleasant male patient who presents with the following:  Here for persistent cough.  He was seen 11/1 with cough for 3 days, tx with albuterol, prednisone, hycodan and astelin. He came back and saw me 11/24 and was tx with doxycycline and tussionex.   He called back yesterday asking for a refill of his cough syrup. However I I noticed that he got hycodan in September, vicodin in October and tussionex in November.I called and LMOM asking him to come in to be seen.  However he states he did not get this message.    Checked his phone while in office and indeed the message was there.   He notes that his sx are "coming and going" still.  Wearing CPAP at night makes this worse.  He states that he had just been taking his hydrocodone at night.  He does better when he takes both mucinex and tussionex.  He is now coughing more on occasion, and may have a coughing fit. He is not coughing up any mucus now.  However he is "terrified that I will not be able to get to sleep because of this cough."  He really wants more cough syrup  He saw his ENT yesterday- they are going to look at his CPAP  Patient Active Problem List   Diagnosis Date Noted  . OBSTRUCTIVE SLEEP APNEA 05/07/2010    Past Medical History  Diagnosis Date  . Sleep apnea     History reviewed. No pertinent past surgical history.  History  Substance Use Topics  . Smoking status: Never Smoker   . Smokeless tobacco: Not on file  . Alcohol Use: No    History reviewed. No pertinent family history.  No Known Allergies  Medication list has been reviewed and updated.  Current Outpatient Prescriptions on File Prior to Visit  Medication Sig Dispense Refill   . HYDROcodone-acetaminophen (NORCO) 5-325 MG per tablet Take 1 tablet by mouth every 6 (six) hours as needed for moderate pain. 30 tablet 0  . azelastine (ASTELIN) 0.1 % nasal spray Place 2 sprays into both nostrils 2 (two) times daily. Use in each nostril as directed (Patient not taking: Reported on 11/28/2014) 30 mL 12  . chlorpheniramine-HYDROcodone (TUSSIONEX PENNKINETIC ER) 10-8 MG/5ML LQCR Take 5 mLs by mouth every 12 (twelve) hours as needed for cough. (Patient not taking: Reported on 11/28/2014) 90 mL 0  . doxycycline (VIBRAMYCIN) 100 MG capsule Take 1 capsule (100 mg total) by mouth 2 (two) times daily. (Patient not taking: Reported on 11/28/2014) 20 capsule 0  . nystatin (MYCOSTATIN) 100000 UNIT/ML suspension Take 5 mLs (500,000 Units total) by mouth 4 (four) times daily. (Patient not taking: Reported on 11/28/2014) 60 mL 0  . predniSONE (DELTASONE) 10 MG tablet Take 4 day for 3 days 3 a day for 3 days 2 a day for 3 days 1 a day for 3 days (Patient not taking: Reported on 11/28/2014) 30 tablet 0  . sildenafil (VIAGRA) 100 MG tablet Take 0.5-1 tablets (50-100 mg total) by mouth daily as needed for erectile dysfunction. (Patient not taking: Reported on 11/28/2014) 5 tablet 11   No current facility-administered medications  on file prior to visit.    Review of Systems:  As per HPI- otherwise negative.   Physical Examination: Filed Vitals:   11/28/14 1024  BP: 126/72  Pulse: 77  Temp: 98 F (36.7 C)  Resp: 18   Filed Vitals:   11/28/14 1024  Height: 6\' 4"  (1.93 m)  Weight: 330 lb (149.687 kg)   Body mass index is 40.19 kg/(m^2). Ideal Body Weight: Weight in (lb) to have BMI = 25: 205  GEN: WDWN, NAD, Non-toxic, A & O x 3, obese, looks well HEENT: Atraumatic, Normocephalic. Neck supple. No masses, No LAD.  Bilateral TM wnl, oropharynx normal.  PEERL,EOMI.   Ears and Nose: No external deformity. CV: RRR, No M/G/R. No JVD. No thrill. No extra heart sounds. PULM: CTA B, no  wheezes, crackles, rhonchi. No retractions. No resp. distress. No accessory muscle use.Marland Kitchen EXTR: No c/c/e NEURO Normal gait.  PSYCH: Normally interactive. Conversant. Not depressed or anxious appearing.  Calm demeanor.   [facility] reading (PRIMARY) by  Dr. Lorelei Pont. CXR: negative.   CHEST 2 VIEW  COMPARISON: 12/05/2006.  FINDINGS: Mediastinum and hilar structures normal. Lungs are clear. Heart size normal. No pleural effusion or pneumothorax. No acute bony abnormality.  IMPRESSION: No active cardiopulmonary disease.  Assessment and Plan: Cough, persistent - Plan: DG Chest 2 View, chlorpheniramine-HYDROcodone (TUSSIONEX PENNKINETIC ER) 10-8 MG/5ML LQCR  Acute bronchitis due to other specified organisms - Plan: azithromycin (ZITHROMAX) 250 MG tablet, chlorpheniramine-HYDROcodone (TUSSIONEX PENNKINETIC ER) 10-8 MG/5ML Gaspar Skeeters  Bobby Mcguire is here today with cough for over a month. He is concerned that he will not be able to sleep due to this cough.  Cautioned him about prolonged use of narcotics and danger of addiction.  He understands this and that there will not be any more rx for tussionex. Refilled 45 mg of this and also will use a zpack to cover for atypicals.  He will let me know if not better soon  Signed Lamar Blinks, MD

## 2014-12-02 ENCOUNTER — Encounter (HOSPITAL_COMMUNITY): Payer: Self-pay | Admitting: Emergency Medicine

## 2014-12-02 ENCOUNTER — Emergency Department (HOSPITAL_COMMUNITY)
Admission: EM | Admit: 2014-12-02 | Discharge: 2014-12-02 | Disposition: A | Discharge status: Home / Self Care | Payer: BC Managed Care – PPO | Attending: Emergency Medicine | Admitting: Emergency Medicine

## 2014-12-02 DIAGNOSIS — Z9981 Dependence on supplemental oxygen: Secondary | ICD-10-CM | POA: Insufficient documentation

## 2014-12-02 DIAGNOSIS — Z79899 Other long term (current) drug therapy: Secondary | ICD-10-CM | POA: Diagnosis not present

## 2014-12-02 DIAGNOSIS — G4700 Insomnia, unspecified: Secondary | ICD-10-CM | POA: Insufficient documentation

## 2014-12-02 DIAGNOSIS — F419 Anxiety disorder, unspecified: Secondary | ICD-10-CM

## 2014-12-02 DIAGNOSIS — G4730 Sleep apnea, unspecified: Secondary | ICD-10-CM | POA: Insufficient documentation

## 2014-12-02 MED ORDER — ALPRAZOLAM 0.25 MG PO TABS: 0.2500 mg | ORAL_TABLET | Freq: Every evening | ORAL | Status: DC | PRN

## 2014-12-02 NOTE — Discharge Instructions (Signed)
Panic Attacks °Panic attacks are sudden, short feelings of great fear or discomfort. You may have them for no reason when you are relaxed, when you are uneasy (anxious), or when you are sleeping.  °HOME CARE °· Take all your medicines as told. °· Check with your doctor before starting new medicines. °· Keep all doctor visits. °GET HELP IF: °· You are not able to take your medicines as told. °· Your symptoms do not get better. °· Your symptoms get worse. °GET HELP RIGHT AWAY IF: °· Your attacks seem different than your normal attacks. °· You have thoughts about hurting yourself or others. °· You take panic attack medicine and you have a side effect. °MAKE SURE YOU: °· Understand these instructions. °· Will watch your condition. °· Will get help right away if you are not doing well or get worse. °Document Released: 01/07/2011 Document Revised: 09/25/2013 Document Reviewed: 07/19/2013 °ExitCare® Patient Information ©2015 [employer]. This information is not intended to replace advice given to you by your health care provider. Make sure you discuss any questions you have with your health care provider. ° °

## 2014-12-02 NOTE — ED Notes (Signed)
Pt states he has sleep apnea and feels like his mask is not fitting correctly and the machine settings are too high  Pt was seen by his ENT last Tuesday and someone is to call him to come out and adjust the settings on his machine  Pt states he has not been able to sleep for the past few nights and is not feeling well  Pt took 3 tylenol pm tonight and went out and bought some Nyquil Z but has not taken any yet  Pt states he is having dry mouth and pt is very anxious about his situation  Spoke with pt at length in lobby before pt checked in

## 2014-12-02 NOTE — ED Provider Notes (Signed)
CSN: [id number]     Arrival date & time 12/02/14  5956 History   First MD Initiated Contact with Patient 12/02/14 [phone]     Chief Complaint  Patient presents with  . Anxiety      HPI  Vision presents for evaluation of difficulty sleeping and anxiety. Symptoms are 3-4 days. States he's one C Pap for years. Last few weeks he is waking up at night having trouble going to sleep. He feels like he is having trouble with the noisy machine makes now. States he gets frustrated. He gets anxious. Takes his mask off and on repeatedly. States that his mind races at night has trouble sleeping because of all of this. Presents here with a dry mouth after taking 3 or 4 Tylenol PM.  Past Medical History  Diagnosis Date  . Sleep apnea    History reviewed. No pertinent past surgical history. Family History  Problem Relation Age of Onset  . Cancer Mother    History  Substance Use Topics  . Smoking status: Never Smoker   . Smokeless tobacco: Not on file  . Alcohol Use: Yes    Review of Systems  Constitutional: Negative for fever, chills, diaphoresis, appetite change and fatigue.  HENT: Negative for mouth sores, sore throat and trouble swallowing.   Eyes: Negative for visual disturbance.  Respiratory: Negative for cough, chest tightness, shortness of breath and wheezing.   Cardiovascular: Negative for chest pain.  Gastrointestinal: Negative for nausea, vomiting, abdominal pain, diarrhea and abdominal distention.  Endocrine: Negative for polydipsia, polyphagia and polyuria.  Genitourinary: Negative for dysuria, frequency and hematuria.  Musculoskeletal: Negative for gait problem.  Skin: Negative for color change, pallor and rash.  Neurological: Negative for dizziness, syncope, light-headedness and headaches.  Hematological: Does not bruise/bleed easily.  Psychiatric/Behavioral: Negative for behavioral problems and confusion. The patient is nervous/anxious.       Allergies  Review of patient's  allergies indicates no known allergies.  Home Medications   Prior to Admission medications   Medication Sig Start Date End Date Taking? Authorizing Provider  azithromycin (ZITHROMAX) 250 MG tablet Use as a zpack Patient taking differently: Take 250-500 mg by mouth daily. 500 mg on day 1, then 250 mg on days 2-5. 11/28/14  Yes Gay Filler Copland, MD  magnesium oxide (MAG-OX) 400 MG tablet Take 400 mg by mouth daily as needed (for excessive sweating to prevent cramping.).   Yes Historical Provider, MD  Multiple Vitamin (MULTIVITAMIN WITH MINERALS) TABS tablet Take 1 tablet by mouth daily.   Yes Historical Provider, MD  Multiple Vitamins-Minerals (AIRBORNE PO) Take by mouth.   Yes Historical Provider, MD  ALPRAZolam (XANAX) 0.25 MG tablet Take 1 tablet (0.25 mg total) by mouth at bedtime as needed for anxiety or sleep. 12/02/14   Tanna Furry, MD  chlorpheniramine-HYDROcodone [facility] PENNKINETIC ER) 10-8 MG/5ML LQCR Take 5 mLs by mouth every 12 (twelve) hours as needed for cough. Patient not taking: Reported on 12/02/2014 11/28/14   Darreld Mclean, MD  HYDROcodone-acetaminophen (NORCO) 5-325 MG per tablet Take 1 tablet by mouth every 6 (six) hours as needed for moderate pain. Patient not taking: Reported on 12/02/2014 10/01/14   Thao P Le, DO  nystatin (MYCOSTATIN) 100000 UNIT/ML suspension Take 5 mLs (500,000 Units total) by mouth 4 (four) times daily. Patient not taking: Reported on 11/28/2014 10/01/14   Thao P Le, DO  sildenafil (VIAGRA) 100 MG tablet Take 0.5-1 tablets (50-100 mg total) by mouth daily as needed for erectile dysfunction. Patient  taking differently: Take 50-100 mg by mouth daily as needed for erectile dysfunction. For ED. 08/22/14   Robyn Haber, MD   BP 159/82 mmHg  Pulse 73  Temp(Src) 98 F (36.7 C) (Oral)  Resp 20  SpO2 95% Physical Exam  Constitutional: He is oriented to person, place, and time. He appears well-developed and well-nourished. No distress.  HENT:   Head: Normocephalic.  Eyes: Conjunctivae are normal. Pupils are equal, round, and reactive to light. No scleral icterus.  Neck: Normal range of motion. Neck supple. No thyromegaly present.  Cardiovascular: Normal rate and regular rhythm.  Exam reveals no gallop and no friction rub.   No murmur heard. Pulmonary/Chest: Effort normal and breath sounds normal. No respiratory distress. He has no wheezes. He has no rales.  Abdominal: Soft. Bowel sounds are normal. He exhibits no distension. There is no tenderness. There is no rebound.  Musculoskeletal: Normal range of motion.  Neurological: He is alert and oriented to person, place, and time.  Skin: Skin is warm and dry. No rash noted.  Psychiatric: He has a normal mood and affect. His behavior is normal.    ED Course  Procedures (including critical care time) Labs Review Labs Reviewed - No data to display  Imaging Review No results found.   EKG Interpretation None      MDM   Final diagnoses:  Anxiety    Patient with some obvious anxiety. Rather calm here. 3 nights of difficulty with insomnia and anxiety. Suspect this crescendos when he attempts to adjust his deep appendectomy gets frustrated. I think 0.25 mg Xanax would be quite safe with him and they put a ceiling on his anxiety before bed. Primary care follow-up recommended. Recheck any worsening symptoms.    Tanna Furry, MD 12/02/14 [phone]

## 2014-12-10 ENCOUNTER — Telehealth: Payer: Self-pay | Admitting: Family Medicine

## 2014-12-10 DIAGNOSIS — N529 Male erectile dysfunction, unspecified: Secondary | ICD-10-CM

## 2014-12-10 NOTE — Telephone Encounter (Signed)
Changed pharmacy- please advise refill

## 2014-12-10 NOTE — Telephone Encounter (Signed)
Patient is having problems getting his Viagra RX. States that he cannot afford to pick up pills in pharmacy anymore so he would to use [facility].  Patient provided what he thinks is the fax number [phone] (fax maybe). Please call patient for more information.   [phone]

## 2014-12-13 ENCOUNTER — Other Ambulatory Visit: Payer: Self-pay | Admitting: Family Medicine

## 2014-12-13 DIAGNOSIS — N529 Male erectile dysfunction, unspecified: Secondary | ICD-10-CM

## 2014-12-13 MED ORDER — SILDENAFIL CITRATE 100 MG PO TABS: 50.0000 mg | ORAL_TABLET | Freq: Every day | ORAL | Status: DC | PRN

## 2015-03-12 ENCOUNTER — Telehealth: Payer: Self-pay

## 2015-03-12 DIAGNOSIS — N529 Male erectile dysfunction, unspecified: Secondary | ICD-10-CM

## 2015-03-12 MED ORDER — SILDENAFIL CITRATE 100 MG PO TABS: 50.0000 mg | ORAL_TABLET | Freq: Every day | ORAL | Status: DC | PRN

## 2015-03-12 NOTE — Telephone Encounter (Signed)
Dr. Carlean Jews - Pt's insurance will pay for 8 units per month of instead of 5 units of viagra.  Can we please change this & send to [facility]?  [phone]

## 2015-05-23 ENCOUNTER — Ambulatory Visit (INDEPENDENT_AMBULATORY_CARE_PROVIDER_SITE_OTHER): Payer: BLUE CROSS/BLUE SHIELD | Admitting: Family Medicine

## 2015-05-23 VITALS — BP 122/80 | HR 78 | Temp 98.8°F | Resp 18 | Ht 75.0 in | Wt 333.0 lb

## 2015-05-23 DIAGNOSIS — J310 Chronic rhinitis: Secondary | ICD-10-CM | POA: Diagnosis not present

## 2015-05-23 DIAGNOSIS — M25579 Pain in unspecified ankle and joints of unspecified foot: Secondary | ICD-10-CM

## 2015-05-23 DIAGNOSIS — R609 Edema, unspecified: Secondary | ICD-10-CM | POA: Diagnosis not present

## 2015-05-23 DIAGNOSIS — T485X5A Adverse effect of other anti-common-cold drugs, initial encounter: Secondary | ICD-10-CM

## 2015-05-23 LAB — POCT CBC
Granulocyte percent: 60.9 %G (ref 37–80)
HCT, POC: 45.8 % (ref 43.5–53.7)
Hemoglobin: 14.6 g/dL (ref 14.1–18.1)
Lymph, poc: 3.4 (ref 0.6–3.4)
MCH, POC: 31.9 pg — AB (ref 27–31.2)
MCHC: 31.8 g/dL (ref 31.8–35.4)
MCV: 100.2 fL — AB (ref 80–97)
MID (cbc): 0.4 (ref 0–0.9)
MPV: 7.6 fL (ref 0–99.8)
POC Granulocyte: 5.9 (ref 2–6.9)
POC LYMPH PERCENT: 35.2 %L (ref 10–50)
POC MID %: 3.9 %M (ref 0–12)
Platelet Count, POC: 311 10*3/uL (ref 142–424)
RBC: 4.57 M/uL — AB (ref 4.69–6.13)
RDW, POC: 15.5 %
WBC: 9.7 10*3/uL (ref 4.6–10.2)

## 2015-05-23 MED ORDER — FUROSEMIDE 20 MG PO TABS: 20.0000 mg | ORAL_TABLET | Freq: Every day | ORAL | Status: DC

## 2015-05-23 MED ORDER — MOMETASONE FUROATE 50 MCG/ACT NA SUSP: 2.0000 | Freq: Every day | NASAL | Status: DC

## 2015-05-23 NOTE — Patient Instructions (Addendum)
Use the mometasone nose spray 2 sprays each nostril twice daily for 1 week, then once daily  Do not use Afrin  Avoid liquid calories such as soft drinks and beer  Get more exercise using things like an elliptical or bicycle  We will let you know the results of your labs in a few days  Take furosemide 20 mg 1 each morning to try and eliminate some fluid.  If you have not improving over the next 2 or 3 weeks, and for a recheck  Return at anytime if worse

## 2015-05-23 NOTE — Progress Notes (Signed)
  Subjective:  Patient ID: Bobby Mcguire, male    DOB: 1966/07/07  Age: 49 y.o. MRN: [id number]  Patient is here complaining of pain in his ankles. He says both of them swell every day. They don't go down much overnight. He is always swollen. He says one is whether he drinks too much beers. He says when he drinks beer he eats a lot of weddings and things with them. He has gained over 30 pounds. He works [employer], desk job I believe. He does not smoke. No chest pains or shortness of breath. He does have sleep apnea. He uses Afrin often at nighttime even though he knows he is not supposed to because "one little drop takes care of her nose" but then he is stuffier. The ankles hurts some. Not on any regular medicines.  Objective:   Obese large man in no acute distress. Neck supple without thyromegaly. Chest clear to auscultation. Heart regular without murmurs gallops or arrhythmias. Abdomen soft without mass or tenderness. No presacral edema. Extremities have edema up to the knees, 3+. Some pain on motion of his ankle joints.  Assessment & Plan:   Assessment: Edema Rhinitis medicamentosa  Obesity Ankle pain  Plan: Patient Instructions  Use the mometasone nose spray 2 sprays each nostril twice daily for 1 week, then once daily  Do not use Afrin  Avoid liquid calories such as soft drinks and beer  Get more exercise using things like an elliptical or bicycle  We will let you know the results of your labs in a few days  Take furosemide 20 mg 1 each morning to try and eliminate some fluid.  If you have not improving over the next 2 or 3 weeks, and for a recheck  Return at anytime if worse    Chaelyn Bunyan, MD 05/23/2015

## 2015-05-24 LAB — COMPLETE METABOLIC PANEL WITH GFR
ALT: 25 U/L (ref 0–53)
AST: 19 U/L (ref 0–37)
Albumin: 4.2 g/dL (ref 3.5–5.2)
Alkaline Phosphatase: 45 U/L (ref 39–117)
BUN: 13 mg/dL (ref 6–23)
CO2: 27 mEq/L (ref 19–32)
Calcium: 9.5 mg/dL (ref 8.4–10.5)
Chloride: 104 mEq/L (ref 96–112)
Creat: 1.1 mg/dL (ref 0.50–1.35)
GFR, Est African American: >89 mL/min
GFR, Est Non African American: 79 mL/min
Glucose, Bld: 87 mg/dL (ref 70–99)
Potassium: 3.8 mEq/L (ref 3.5–5.3)
Sodium: 139 mEq/L (ref 135–145)
Total Bilirubin: 0.6 mg/dL (ref 0.2–1.2)
Total Protein: 7 g/dL (ref 6.0–8.3)

## 2015-05-24 LAB — BRAIN NATRIURETIC PEPTIDE: Brain Natriuretic Peptide: 7.4 pg/mL (ref 0.0–100.0)

## 2015-05-24 LAB — TSH: TSH: 1.609 u[IU]/mL (ref 0.350–4.500)

## 2015-05-24 LAB — URIC ACID: Uric Acid, Serum: 5.7 mg/dL (ref 4.0–7.8)

## 2015-05-27 ENCOUNTER — Encounter: Payer: Self-pay | Admitting: Family Medicine

## 2015-06-18 ENCOUNTER — Other Ambulatory Visit: Payer: Self-pay

## 2015-06-18 DIAGNOSIS — T485X5A Adverse effect of other anti-common-cold drugs, initial encounter: Principal | ICD-10-CM

## 2015-06-18 DIAGNOSIS — J310 Chronic rhinitis: Secondary | ICD-10-CM

## 2015-06-18 MED ORDER — MOMETASONE FUROATE 50 MCG/ACT NA SUSP
2.0000 | Freq: Every day | NASAL | Status: DC
Start: 2015-06-18 — End: 2016-04-17

## 2015-10-28 ENCOUNTER — Telehealth: Payer: Self-pay

## 2015-10-28 NOTE — Telephone Encounter (Signed)
Pt states needs Rx refill  For Viagra sent to [facility]   Best phone [phone]

## 2015-10-30 ENCOUNTER — Other Ambulatory Visit: Payer: Self-pay | Admitting: Family Medicine

## 2015-10-30 DIAGNOSIS — N529 Male erectile dysfunction, unspecified: Secondary | ICD-10-CM

## 2015-10-30 MED ORDER — SILDENAFIL CITRATE 100 MG PO TABS: 50.0000 mg | ORAL_TABLET | Freq: Every day | ORAL | Status: DC | PRN

## 2015-11-23 ENCOUNTER — Ambulatory Visit (INDEPENDENT_AMBULATORY_CARE_PROVIDER_SITE_OTHER): Payer: BLUE CROSS/BLUE SHIELD

## 2015-11-23 ENCOUNTER — Ambulatory Visit (INDEPENDENT_AMBULATORY_CARE_PROVIDER_SITE_OTHER): Payer: BLUE CROSS/BLUE SHIELD | Admitting: Emergency Medicine

## 2015-11-23 VITALS — BP 136/80 | HR 87 | Temp 98.7°F | Resp 17 | Ht 76.5 in | Wt 334.0 lb

## 2015-11-23 DIAGNOSIS — M25561 Pain in right knee: Secondary | ICD-10-CM | POA: Diagnosis not present

## 2015-11-23 DIAGNOSIS — G4733 Obstructive sleep apnea (adult) (pediatric): Secondary | ICD-10-CM

## 2015-11-23 DIAGNOSIS — R05 Cough: Secondary | ICD-10-CM

## 2015-11-23 DIAGNOSIS — Z9989 Dependence on other enabling machines and devices: Secondary | ICD-10-CM

## 2015-11-23 DIAGNOSIS — R053 Chronic cough: Secondary | ICD-10-CM

## 2015-11-23 DIAGNOSIS — R0981 Nasal congestion: Secondary | ICD-10-CM

## 2015-11-23 DIAGNOSIS — G8929 Other chronic pain: Secondary | ICD-10-CM | POA: Diagnosis not present

## 2015-11-23 MED ORDER — HYDROCODONE-HOMATROPINE 5-1.5 MG/5ML PO SYRP: 5.0000 mL | ORAL_SOLUTION | Freq: Three times a day (TID) | ORAL | Status: DC | PRN

## 2015-11-23 MED ORDER — PREDNISONE 20 MG PO TABS: ORAL_TABLET | ORAL | Status: DC

## 2015-11-23 MED ORDER — AZELASTINE HCL 0.1 % NA SOLN: 2.0000 | Freq: Two times a day (BID) | NASAL | Status: DC

## 2015-11-23 NOTE — Progress Notes (Signed)
This chart was scribed for Arlyss Queen, MD by Moises Blood, Medical Scribe. This patient was seen in Room 10 and the patient's care was started 8:33 AM.  Chief Complaint:  Chief Complaint  Patient presents with  . Nasal Congestion  . Cough    HPI: Bobby Mcguire is a 49 y.o. male who reports to [facility] today complaining of gradual onset nasal congestion with cough that started 5 days ago. He states that it started with nasal congestion and he couldn't go to work the next 2 days. He wasn't able to clear out the congestion. He informs that he couldn't sleep well at night due to congestion and sleep apnea. He took some prednisone and OTC nasal spray for relief. He went to the [facility] last night to work and was able to blow out some congestion. He denies fever.   Arthralgia He also notes having some fluid in his right knee. He was requesting a cortisone shot.   Medical form He has sleep apnea medical alert form for [employer] to be filled out.   He works as a Metallurgist.   Past Medical History  Diagnosis Date  . Sleep apnea    No past surgical history on file. Social History   Social History  . Marital Status: Single    Spouse Name: N/A  . Number of Children: N/A  . Years of Education: N/A   Social History Main Topics  . Smoking status: Never Smoker   . Smokeless tobacco: None  . Alcohol Use: Yes  . Drug Use: No  . Sexual Activity: Not Asked   Other Topics Concern  . None   Social History Narrative   Family History  Problem Relation Age of Onset  . Cancer Mother    No Known Allergies Prior to Admission medications   Medication Sig Start Date End Date Taking? Authorizing Provider  mometasone (NASONEX) 50 MCG/ACT nasal spray Place 2 sprays into the nose daily. 06/18/15  Yes Posey Boyer, MD  Multiple Vitamin (MULTIVITAMIN WITH MINERALS) TABS tablet Take 1 tablet by mouth daily.   Yes Historical Provider, MD  Multiple Vitamins-Minerals (AIRBORNE PO) Take  1 packet by mouth as needed (to boost immune health.).    Yes Historical Provider, MD  nystatin (MYCOSTATIN) 100000 UNIT/ML suspension Take 5 mLs (500,000 Units total) by mouth 4 (four) times daily. 10/01/14  Yes Thao P Le, DO  sildenafil (VIAGRA) 100 MG tablet Take 0.5-1 tablets (50-100 mg total) by mouth daily as needed for erectile dysfunction. For ED. 10/30/15  Yes Robyn Haber, MD  furosemide (LASIX) 20 MG tablet Take 1 tablet (20 mg total) by mouth daily. Patient not taking: Reported on 11/23/2015 05/23/15   Posey Boyer, MD  HYDROcodone-acetaminophen (NORCO) 5-325 MG per tablet Take 1 tablet by mouth every 6 (six) hours as needed for moderate pain. Patient not taking: Reported on 12/02/2014 10/01/14   Thao P Le, DO  magnesium oxide (MAG-OX) 400 MG tablet Take 400 mg by mouth daily as needed (for excessive sweating to prevent cramping.).    Historical Provider, MD     ROS:  Constitutional: negative for fever, chills, night sweats, weight changes; positive for fatigue  HEENT: negative for vision changes, hearing loss, rhinorrhea, ST, epistaxis, or sinus pressure; positive for congestion Cardiovascular: negative for chest pain or palpitations Respiratory: negative for hemoptysis, wheezing, shortness of breath; positive for cough Abdominal: negative for abdominal pain, nausea, vomiting, diarrhea, or constipation Dermatological: negative for rash  Neurologic: negative for headache, dizziness, or syncope Musc: positive for arthralgia (right knee) All other systems reviewed and are otherwise negative with the exception to those above and in the HPI.  PHYSICAL EXAM: Filed Vitals:   11/23/15 0820  BP: 136/80  Pulse: 87  Temp: 98.7 F (37.1 C)  Resp: 17   Body mass index is 40.13 kg/(m^2).   General: Alert and cooperative with dry cough HEENT:  Normocephalic, atraumatic, oropharynx patent; ears nl, nasal turbinates blue swollen, throat nl Eye: EOMI, PEERLDC Cardiovascular:  Regular  rate and rhythm, no rubs murmurs or gallops.  No Carotid bruits, radial pulse intact. No pedal edema.  Respiratory: Clear to auscultation bilaterally.  No wheezes, rales, or rhonchi.  No cyanosis, no use of accessory musculature Abdominal: No organomegaly, abdomen is soft and non-tender, positive bowel sounds. No masses. Musculoskeletal: Gait intact. 3+ effusion right knee and crepitation on flexion and extension Skin: No rashes. Neurologic: Facial musculature symmetric. Psychiatric: Patient acts appropriately throughout our interaction.  Lymphatic: No cervical or submandibular lymphadenopathy Genitourinary/Anorectal: No acute findings   LABS:    EKG/XRAY:   Primary read interpreted by Dr. Everlene Farrier at [facility]. No acute disease   ASSESSMENT/PLAN: Patient has significant nasal congestion. He did not respond well to steroid sprays. He is placed on prednisone for 5 days along with an Astepro nasal spray. He was also given Hycodan for severe cough. He can return to work on Wednesday. His chest x-ray done in the office was normal.  By signing my name below, I, Moises Blood, attest that this documentation has been prepared under the direction and in the presence of Arlyss Queen, MD. Electronically Signed: Moises Blood, [place]. 11/23/2015 , 8:33 AM .    Johney [place] sideeffects, risk and benefits, and alternatives of medications d/w patient. Patient is aware that all medications have potential sideeffects and we are unable to predict every sideeffect or drug-drug interaction that may occur.  Arlyss Queen MD 11/23/2015 8:33 AM

## 2015-11-23 NOTE — Patient Instructions (Signed)

## 2015-11-25 ENCOUNTER — Ambulatory Visit (INDEPENDENT_AMBULATORY_CARE_PROVIDER_SITE_OTHER): Payer: BLUE CROSS/BLUE SHIELD | Admitting: Physician Assistant

## 2015-11-25 VITALS — BP 154/94 | HR 91 | Temp 98.9°F | Resp 17 | Ht 76.5 in | Wt 326.0 lb

## 2015-11-25 DIAGNOSIS — R05 Cough: Secondary | ICD-10-CM | POA: Diagnosis not present

## 2015-11-25 DIAGNOSIS — I10 Essential (primary) hypertension: Secondary | ICD-10-CM | POA: Diagnosis not present

## 2015-11-25 DIAGNOSIS — R0981 Nasal congestion: Secondary | ICD-10-CM | POA: Diagnosis not present

## 2015-11-25 DIAGNOSIS — R053 Chronic cough: Secondary | ICD-10-CM

## 2015-11-25 NOTE — Patient Instructions (Signed)
Please check your blood pressure twice per week.  If it is over 140/90, I would like you to return, because you need to be placed on blood pressure medicines.   Illness, and unrest can drive the blood pressure up, but we need to make sure this is the case.     DASH Eating Plan DASH stands for "Dietary Approaches to Stop Hypertension." The DASH eating plan is a healthy eating plan that has been shown to reduce high blood pressure (hypertension). Additional health benefits may include reducing the risk of type 2 diabetes mellitus, heart disease, and stroke. The DASH eating plan may also help with weight loss. WHAT DO I NEED TO KNOW ABOUT THE DASH EATING PLAN? For the DASH eating plan, you will follow these general guidelines:  Choose foods with a percent daily value for sodium of less than 5% (as listed on the food label).  Use salt-free seasonings or herbs instead of table salt or sea salt.  Check with your health care provider or pharmacist before using salt substitutes.  Eat lower-sodium products, often labeled as "lower sodium" or "no salt added."  Eat fresh foods.  Eat more vegetables, fruits, and low-fat dairy products.  Choose whole grains. Look for the word "whole" as the first word in the ingredient list.  Choose fish and skinless chicken or [place] more often than red meat. Limit fish, poultry, and meat to 6 oz (170 g) each day.  Limit sweets, desserts, sugars, and sugary drinks.  Choose heart-healthy fats.  Limit cheese to 1 oz (28 g) per day.  Eat more home-cooked food and less restaurant, buffet, and fast food.  Limit fried foods.  Cook foods using methods other than frying.  Limit canned vegetables. If you do use them, rinse them well to decrease the sodium.  When eating at a restaurant, ask that your food be prepared with less salt, or no salt if possible. WHAT FOODS CAN I EAT? Seek help from a dietitian for individual calorie needs. Grains Whole grain or whole  wheat bread. Brown rice. Whole grain or whole wheat pasta. Quinoa, bulgur, and whole grain cereals. Low-sodium cereals. Corn or whole wheat flour tortillas. Whole grain cornbread. Whole grain crackers. Low-sodium crackers. Vegetables Fresh or frozen vegetables (raw, steamed, roasted, or grilled). Low-sodium or reduced-sodium tomato and vegetable juices. Low-sodium or reduced-sodium tomato sauce and paste. Low-sodium or reduced-sodium canned vegetables.  Fruits All fresh, canned (in natural juice), or frozen fruits. Meat and Other Protein Products Ground beef (85% or leaner), grass-fed beef, or beef trimmed of fat. Skinless chicken or [place]. Ground chicken or [place]. Pork trimmed of fat. All fish and seafood. Eggs. Dried beans, peas, or lentils. Unsalted nuts and seeds. Unsalted canned beans. Dairy Low-fat dairy products, such as skim or 1% milk, 2% or reduced-fat cheeses, low-fat ricotta or cottage cheese, or plain low-fat yogurt. Low-sodium or reduced-sodium cheeses. Fats and Oils Tub margarines without trans fats. Light or reduced-fat mayonnaise and salad dressings (reduced sodium). Avocado. Safflower, olive, or canola oils. Natural peanut or almond butter. Other Unsalted popcorn and pretzels. The items listed above may not be a complete list of recommended foods or beverages. Contact your dietitian for more options. WHAT FOODS ARE NOT RECOMMENDED? Grains White bread. White pasta. White rice. Refined cornbread. Bagels and croissants. Crackers that contain trans fat. Vegetables Creamed or fried vegetables. Vegetables in a cheese sauce. Regular canned vegetables. Regular canned tomato sauce and paste. Regular tomato and vegetable juices. Fruits Dried fruits. Canned fruit  in light or heavy syrup. Fruit juice. Meat and Other Protein Products Fatty cuts of meat. Ribs, chicken wings, bacon, sausage, bologna, salami, chitterlings, fatback, hot dogs, bratwurst, and packaged luncheon meats. Salted  nuts and seeds. Canned beans with salt. Dairy Whole or 2% milk, cream, half-and-half, and cream cheese. Whole-fat or sweetened yogurt. Full-fat cheeses or blue cheese. Nondairy creamers and whipped toppings. Processed cheese, cheese spreads, or cheese curds. Condiments Onion and garlic salt, seasoned salt, table salt, and sea salt. Canned and packaged gravies. Worcestershire sauce. Tartar sauce. Barbecue sauce. Teriyaki sauce. Soy sauce, including reduced sodium. Steak sauce. Fish sauce. Oyster sauce. Cocktail sauce. Horseradish. Ketchup and mustard. Meat flavorings and tenderizers. Bouillon cubes. Hot sauce. Tabasco sauce. Marinades. Taco seasonings. Relishes. Fats and Oils Butter, stick margarine, lard, shortening, ghee, and bacon fat. Coconut, palm kernel, or palm oils. Regular salad dressings. Other Pickles and olives. Salted popcorn and pretzels. The items listed above may not be a complete list of foods and beverages to avoid. Contact your dietitian for more information. WHERE CAN I FIND MORE INFORMATION? National Heart, Lung, and Blood Institute: travelstabloid.com   This information is not intended to replace advice given to you by your health care provider. Make sure you discuss any questions you have with your health care provider.   Document Released: 11/24/2011 Document Revised: 12/26/2014 Document Reviewed: 10/09/2013 Elsevier Interactive Patient Education [employer].

## 2015-11-25 NOTE — Progress Notes (Signed)
[facility] [address] [place] [address] [phone]  Date:  11/25/2015   Name:  DUVALL GLISAN   DOB:  [birth year]   MRN:  [id number]  PCP:  Nilda Simmer, MD    History of Present Illness:  SILBERIO VISITACION is a 49 y.o. male patient who presents to [facility] for cc of cough and to request more time off.  Placed on prednisone.  Reports that cough is resolving, but he would like to have some more days off.  He wears a pap machine at night, and due to congestion, has not been using the machine.  He feels exhausted, and is concerned of returning to work at this time.  He is a Water engineer.  BP is elevated today.  He is currently no on any medications at this time.  He states that the pap helps with his htn.  He is not on dietary restrictions.  Reports that he had chitterlings and beer last night, which may have caused the elevation.  No chest pains, palpitaitons, or leg swellings.    Patient Active Problem List   Diagnosis Date Noted  . OBSTRUCTIVE SLEEP APNEA 05/07/2010    Past Medical History  Diagnosis Date  . Sleep apnea     No past surgical history on file.  Social History  Substance Use Topics  . Smoking status: Never Smoker   . Smokeless tobacco: None  . Alcohol Use: Yes    Family History  Problem Relation Age of Onset  . Cancer Mother     No Known Allergies  Medication list has been reviewed and updated.  Current Outpatient Prescriptions on File Prior to Visit  Medication Sig Dispense Refill  . azelastine (ASTELIN) 0.1 % nasal spray Place 2 sprays into both nostrils 2 (two) times daily. Use in each nostril as directed 30 mL 11  . HYDROcodone-homatropine (HYCODAN) 5-1.5 MG/5ML syrup Take 5 mLs by mouth every 8 (eight) hours as needed for cough. 120 mL 0  . Multiple Vitamin (MULTIVITAMIN WITH MINERALS) TABS tablet Take 1 tablet by mouth daily.    . Multiple Vitamins-Minerals (AIRBORNE PO) Take 1 packet by mouth as needed (to boost immune  health.).     Marland Kitchen predniSONE (DELTASONE) 20 MG tablet Take 2 tablets daily for 5 days 10 tablet 0  . sildenafil (VIAGRA) 100 MG tablet Take 0.5-1 tablets (50-100 mg total) by mouth daily as needed for erectile dysfunction. For ED. 8 tablet 11  . furosemide (LASIX) 20 MG tablet Take 1 tablet (20 mg total) by mouth daily. (Patient not taking: Reported on 11/23/2015) 30 tablet 0  . HYDROcodone-acetaminophen (NORCO) 5-325 MG per tablet Take 1 tablet by mouth every 6 (six) hours as needed for moderate pain. (Patient not taking: Reported on 12/02/2014) 30 tablet 0  . magnesium oxide (MAG-OX) 400 MG tablet Take 400 mg by mouth daily as needed (for excessive sweating to prevent cramping.).    Marland Kitchen mometasone (NASONEX) 50 MCG/ACT nasal spray Place 2 sprays into the nose daily. (Patient not taking: Reported on 11/25/2015) 51 g 3  . nystatin (MYCOSTATIN) 100000 UNIT/ML suspension Take 5 mLs (500,000 Units total) by mouth 4 (four) times daily. (Patient not taking: Reported on 11/25/2015) 60 mL 0   No current facility-administered medications on file prior to visit.    ROS  ROS otherwise unremarkable unless listed above.   Physical Examination: BP 154/94 mmHg  Pulse 91  Temp(Src) 98.9 F (37.2 C) (Oral)  Resp 17  Ht 6' 4.5" (1.943 m)  Wt 326 lb (147.873 kg)  BMI 39.17 kg/m2  SpO2 98% Ideal Body Weight: Weight in (lb) to have BMI = 25: 207.7  Physical Exam  Constitutional: He is oriented to person, place, and time. He appears well-developed and well-nourished. No distress.  HENT:  Head: Atraumatic.  Right Ear: Tympanic membrane, external ear and ear canal normal.  Left Ear: Tympanic membrane, external ear and ear canal normal.  Nose: Rhinorrhea (mild mucopurulent) present. Right sinus exhibits no maxillary sinus tenderness and no frontal sinus tenderness. Left sinus exhibits no maxillary sinus tenderness and no frontal sinus tenderness.  Mouth/Throat: No uvula swelling. No oropharyngeal exudate,  posterior oropharyngeal edema or posterior oropharyngeal erythema.  Eyes: Conjunctivae, EOM and lids are normal. Pupils are equal, round, and reactive to light. Right eye exhibits normal extraocular motion. Left eye exhibits normal extraocular motion.  Neck: Trachea normal and full passive range of motion without pain. No edema and no erythema present.  Cardiovascular: Normal rate and regular rhythm.  Exam reveals no gallop and no friction rub.   No murmur heard. Pulses:      Dorsalis pedis pulses are 2+ on the right side, and 2+ on the left side.  Pulmonary/Chest: Effort normal. No respiratory distress. He has no decreased breath sounds. He has no wheezes. He has no rhonchi.  Neurological: He is alert and oriented to person, place, and time.  Skin: Skin is warm and dry. He is not diaphoretic.  Psychiatric: He has a normal mood and affect. His behavior is normal.     Assessment and Plan: ZYIR MACRAE is a 49 y.o. male who is here today for cc of a note for work for cough, and congestion -Note written for possible excuse over next 4 days.  Advised to continue treatment plan.   -sickness and lack of sleep possibly effecting bp.  Advised patient to check bp twice per week.  If >140/90, he should return for restart of medications.  Patient noted understanding--appears reluctant.  Given dash diet.     Cough, persistent  Nasal congestion  Essential hypertension   Ivar Drape, PA-C [facility] 11/25/2015 8:45 AM

## 2015-11-26 ENCOUNTER — Telehealth: Payer: Self-pay

## 2015-11-26 NOTE — Telephone Encounter (Signed)
[employer] dept faxed over FMLA forms to be completed by Dr Everlene Farrier. I'm not 100% sure what he is wanting FMLA for but I filled out what I could on the forms and highlighted what needs to be done. Please fill out and return to the FMLA box at the 102 check out desk within 5-7 business days.

## 2015-11-26 NOTE — Telephone Encounter (Signed)
Left message for pt to c/b 

## 2015-11-26 NOTE — Telephone Encounter (Signed)
He will need to come in and talk to me so I can fill out the [facility] forms correctly. I'm not sure why he is on FMLA. He had an acute illness and was out for about a week and I can fill out forms for that. Ask him if that is what he needs the forms for

## 2016-01-06 ENCOUNTER — Other Ambulatory Visit: Payer: Self-pay | Admitting: Emergency Medicine

## 2016-04-17 ENCOUNTER — Ambulatory Visit (INDEPENDENT_AMBULATORY_CARE_PROVIDER_SITE_OTHER): Payer: Managed Care, Other (non HMO) | Admitting: Family Medicine

## 2016-04-17 VITALS — BP 138/86 | HR 77 | Temp 97.6°F | Resp 18 | Ht 76.5 in | Wt 340.0 lb

## 2016-04-17 DIAGNOSIS — IMO0001 Reserved for inherently not codable concepts without codable children: Secondary | ICD-10-CM

## 2016-04-17 DIAGNOSIS — H6692 Otitis media, unspecified, left ear: Secondary | ICD-10-CM | POA: Diagnosis not present

## 2016-04-17 DIAGNOSIS — R030 Elevated blood-pressure reading, without diagnosis of hypertension: Secondary | ICD-10-CM | POA: Diagnosis not present

## 2016-04-17 DIAGNOSIS — R600 Localized edema: Secondary | ICD-10-CM

## 2016-04-17 DIAGNOSIS — Z125 Encounter for screening for malignant neoplasm of prostate: Secondary | ICD-10-CM | POA: Diagnosis not present

## 2016-04-17 DIAGNOSIS — E7800 Pure hypercholesterolemia, unspecified: Secondary | ICD-10-CM | POA: Diagnosis not present

## 2016-04-17 DIAGNOSIS — H6092 Unspecified otitis externa, left ear: Secondary | ICD-10-CM

## 2016-04-17 LAB — CBC
HCT: 46.1 % (ref 38.5–50.0)
Hemoglobin: 14.9 g/dL (ref 13.2–17.1)
MCH: 32.5 pg (ref 27.0–33.0)
MCHC: 32.3 g/dL (ref 32.0–36.0)
MCV: 100.4 fL — ABNORMAL HIGH (ref 80.0–100.0)
MPV: 10.4 fL (ref 7.5–12.5)
Platelets: 301 10*3/uL (ref 140–400)
RBC: 4.59 MIL/uL (ref 4.20–5.80)
RDW: 12.3 % (ref 11.0–15.0)
WBC: 7.5 10*3/uL (ref 3.8–10.8)

## 2016-04-17 LAB — COMPREHENSIVE METABOLIC PANEL
ALT: 26 U/L (ref 9–46)
AST: 19 U/L (ref 10–40)
Albumin: 4.5 g/dL (ref 3.6–5.1)
Alkaline Phosphatase: 54 U/L (ref 40–115)
BUN: 12 mg/dL (ref 7–25)
CO2: 27 mmol/L (ref 20–31)
Calcium: 10.1 mg/dL (ref 8.6–10.3)
Chloride: 107 mmol/L (ref 98–110)
Creat: 1.06 mg/dL (ref 0.60–1.35)
Glucose, Bld: 95 mg/dL (ref 65–99)
Potassium: 4.4 mmol/L (ref 3.5–5.3)
Sodium: 142 mmol/L (ref 135–146)
Total Bilirubin: 0.4 mg/dL (ref 0.2–1.2)
Total Protein: 7.4 g/dL (ref 6.1–8.1)

## 2016-04-17 LAB — LIPID PANEL
Cholesterol: 177 mg/dL (ref 125–200)
HDL: 34 mg/dL — ABNORMAL LOW (ref >=40)
LDL Cholesterol: 116 mg/dL (ref <130)
Total CHOL/HDL Ratio: 5.2 Ratio — ABNORMAL HIGH (ref <=5.0)
Triglycerides: 134 mg/dL (ref <150)
VLDL: 27 mg/dL (ref <30)

## 2016-04-17 LAB — TSH: TSH: 1.23 mIU/L (ref 0.40–4.50)

## 2016-04-17 MED ORDER — FUROSEMIDE 20 MG PO TABS: 20.0000 mg | ORAL_TABLET | Freq: Every day | ORAL | Status: DC

## 2016-04-17 NOTE — Progress Notes (Addendum)
Subjective:    Patient ID: Bobby Mcguire, male    DOB: Jun 05, 1966, 50 y.o.   MRN: [id number] By signing my name below, I, Judithe Modest, attest that this documentation has been prepared under the direction and in the presence of Delman Cheadle, MD. Electronically Signed: Judithe Modest, ER Scribe. 04/17/2016. 8:32 AM.  Chief Complaint  Patient presents with  . Foreign Body in Ear    Possible cotton in left ear  . Belepharitis    Left leg   HPI HPI Comments: Bobby Mcguire is a 50 y.o. male who presents to [facility] complaining of itching in his left ear after using a qtip in his ear yesterday. He believes not all of the cotton came out of his ear when he removed the qtip and he woke up this morning with itching. He has had swimmers ear in the past and is worried he could get it again.  He is also complaining of swelling in his bilateral ankles, left worse than right. He denies pain in his ankles or feet. He does not have and calf pain. He is trying to lose weight. He has no smoking hx. 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    Past Medical History  Diagnosis Date  . Sleep apnea    No Known Allergies  Current Outpatient Prescriptions on File Prior to Visit  Medication Sig Dispense Refill  . magnesium oxide (MAG-OX) 400 MG tablet Take 400 mg by mouth daily as needed (for excessive sweating to prevent cramping.).    Marland Kitchen Multiple Vitamin (MULTIVITAMIN WITH MINERALS) TABS tablet Take 1 tablet by mouth daily.    . Multiple Vitamins-Minerals (AIRBORNE PO) Take 1 packet by mouth as needed (to boost immune health.).     Marland Kitchen azelastine (ASTELIN) 0.1 % nasal spray Place 2 sprays into both nostrils 2 (two) times daily. Use in each nostril as directed (Patient not taking: Reported on 04/17/2016) 30 mL 11  . furosemide (LASIX) 20 MG tablet Take 1 tablet (20 mg total) by mouth daily. (Patient not taking: Reported on 11/23/2015) 30 tablet 0  . HYDROcodone-acetaminophen (NORCO) 5-325 MG per tablet Take  1 tablet by mouth every 6 (six) hours as needed for moderate pain. (Patient not taking: Reported on 12/02/2014) 30 tablet 0  . HYDROcodone-homatropine (HYCODAN) 5-1.5 MG/5ML syrup Take 5 mLs by mouth every 8 (eight) hours as needed for cough. (Patient not taking: Reported on 04/17/2016) 120 mL 0  . mometasone (NASONEX) 50 MCG/ACT nasal spray Place 2 sprays into the nose daily. (Patient not taking: Reported on 04/17/2016) 51 g 3  . nystatin (MYCOSTATIN) 100000 UNIT/ML suspension Take 5 mLs (500,000 Units total) by mouth 4 (four) times daily. (Patient not taking: Reported on 11/25/2015) 60 mL 0  . predniSONE (DELTASONE) 20 MG tablet Take 2 tablets daily for 5 days (Patient not taking: Reported on 04/17/2016) 10 tablet 0  . sildenafil (VIAGRA) 100 MG tablet Take 0.5-1 tablets (50-100 mg total) by mouth daily as needed for erectile dysfunction. For ED. (Patient not taking: Reported on 04/17/2016) 8 tablet 11   No current facility-administered medications on file prior to visit.    Review of Systems  Constitutional: Negative for fever, chills, activity change, appetite change and unexpected weight change.  HENT: Positive for ear pain. Negative for congestion, postnasal drip, rhinorrhea, sinus pressure, trouble swallowing and voice change.   Respiratory: Negative for cough, chest tightness, shortness of breath and wheezing.   Cardiovascular: Positive for leg  swelling. Negative for chest pain and palpitations.  Musculoskeletal: Positive for joint swelling. Negative for myalgias, back pain, arthralgias and gait problem.  Skin: Negative for color change, pallor, rash and wound.  Allergic/Immunologic: Negative for immunocompromised state.  Neurological: Negative for weakness and headaches.  Hematological: Does not bruise/bleed easily.      Objective:  BP 138/86 mmHg  Pulse 77  Temp(Src) 97.6 F (36.4 C) (Oral)  Resp 18  Ht 6' 4.5" (1.943 m)  Wt 340 lb (154.223 kg)  BMI 40.85 kg/m2  SpO2 96%   Physical Exam  Constitutional: He is oriented to person, place, and time. He appears well-developed and well-nourished. No distress.  HENT:  Head: Normocephalic and atraumatic.  Left with small mid ear effusion. Right ear with erythema.   Eyes: Pupils are equal, round, and reactive to light.  Neck: Neck supple.  Cardiovascular: Normal rate.   Trace pitting edema on right to mid calf, 1+ edema on left to just distal to the knee.  Pulmonary/Chest: Effort normal. No respiratory distress.  Musculoskeletal: Normal range of motion.  Neurological: He is alert and oriented to person, place, and time. Coordination normal.  Skin: Skin is warm and dry. He is not diaphoretic.  Psychiatric: He has a normal mood and affect. His behavior is normal.  Nursing note and vitals reviewed.     Assessment & Plan:   Pt is fasting today and requests full lab work. He agreed to return to the clinic in six to eight weeks for a full physical and to review all labs.   1. Inflammation of ear canal, left - used curette to remove a small amount of white casing wax adhered to anterior canal successfully. No FB seen. Stop using qtips, try peroxide or rubbing EtOH with vinegar for any irritation or itching.  2. Pedal edema - responded well to lasix prior so will use prn w/ high K diet.  Did have bnp when seen prior for this that as normal. Asymptomatic - no claudication or PVD sxs - suspect edema is secondary to his abdominal obesity and being on feet most of day combined with eating salt and not much water - pt going to try to work on all of this. Really wants to loose weight.  3. Localized edema   4. Elevated cholesterol - ASCVD risk was 6.3% when last checked 2 yrs ago -> 6.1% today!  5. Elevated blood pressure   6. Screening for prostate cancer     Orders Placed This Encounter  Procedures  . CBC  . Comprehensive metabolic panel    Order Specific Question:  Has the patient fasted?    Answer:  Yes  . PSA  . TSH    . Lipid panel    Order Specific Question:  Has the patient fasted?    Answer:  Yes    Meds ordered this encounter  Medications  . furosemide (LASIX) 20 MG tablet    Sig: Take 1 tablet (20 mg total) by mouth daily. As needed for lower leg swelling.    Dispense:  30 tablet    Refill:  2    I personally performed the services described in this documentation, which was scribed in my presence. The recorded information has been reviewed and considered, and addended by me as needed.  Delman Cheadle, MD MPH

## 2016-04-17 NOTE — Patient Instructions (Addendum)
IF you received an x-ray today, you will receive an invoice from [facility]. Please contact [facility] at [phone] with questions or concerns regarding your invoice.   IF you received labwork today, you will receive an invoice from [employer]. Please contact [facility] at [phone] with questions or concerns regarding your invoice.   Our billing staff will not be able to assist you with questions regarding bills from these companies.  You will be contacted with the lab results as soon as they are available. The fastest way to get your results is to activate your My Chart account. Instructions are located on the last page of this paperwork. If you have not heard from [place] regarding the results in 2 weeks, please contact this office.    Recommend a b complex supplement.  Try to increase your diet in omega-3s (good cholesterol) through fish oil supp, fish, olive oil, nuts, avocados.  Make an appointment for a full physical in about 2 months and come in for fasting labs at that time.  DASH Eating Plan DASH stands for "Dietary Approaches to Stop Hypertension." The DASH eating plan is a healthy eating plan that has been shown to reduce high blood pressure (hypertension). Additional health benefits may include reducing the risk of type 2 diabetes mellitus, heart disease, and stroke. The DASH eating plan may also help with weight loss. WHAT DO I NEED TO KNOW ABOUT THE DASH EATING PLAN? For the DASH eating plan, you will follow these general guidelines:  Choose foods with a percent daily value for sodium of less than 5% (as listed on the food label).  Use salt-free seasonings or herbs instead of table salt or sea salt.  Check with your health care provider or pharmacist before using salt substitutes.  Eat lower-sodium products, often labeled as "lower sodium" or "no salt added."  Eat fresh foods.  Eat more vegetables, fruits, and low-fat  dairy products.  Choose whole grains. Look for the word "whole" as the first word in the ingredient list.  Choose fish and skinless chicken or [place] more often than red meat. Limit fish, poultry, and meat to 6 oz (170 g) each day.  Limit sweets, desserts, sugars, and sugary drinks.  Choose heart-healthy fats.  Limit cheese to 1 oz (28 g) per day.  Eat more home-cooked food and less restaurant, buffet, and fast food.  Limit fried foods.  Cook foods using methods other than frying.  Limit canned vegetables. If you do use them, rinse them well to decrease the sodium.  When eating at a restaurant, ask that your food be prepared with less salt, or no salt if possible. WHAT FOODS CAN I EAT? Seek help from a dietitian for individual calorie needs. Grains Whole grain or whole wheat bread. Brown rice. Whole grain or whole wheat pasta. Quinoa, bulgur, and whole grain cereals. Low-sodium cereals. Corn or whole wheat flour tortillas. Whole grain cornbread. Whole grain crackers. Low-sodium crackers. Vegetables Fresh or frozen vegetables (raw, steamed, roasted, or grilled). Low-sodium or reduced-sodium tomato and vegetable juices. Low-sodium or reduced-sodium tomato sauce and paste. Low-sodium or reduced-sodium canned vegetables.  Fruits All fresh, canned (in natural juice), or frozen fruits. Meat and Other Protein Products Ground beef (85% or leaner), grass-fed beef, or beef trimmed of fat. Skinless chicken or [place]. Ground chicken or [place]. Pork trimmed of fat. All fish and seafood. Eggs. Dried beans, peas, or lentils. Unsalted nuts and seeds. Unsalted canned beans. Dairy Low-fat dairy products,  such as skim or 1% milk, 2% or reduced-fat cheeses, low-fat ricotta or cottage cheese, or plain low-fat yogurt. Low-sodium or reduced-sodium cheeses. Fats and Oils Tub margarines without trans fats. Light or reduced-fat mayonnaise and salad dressings (reduced sodium). Avocado. Safflower, olive, or  canola oils. Natural peanut or almond butter. Other Unsalted popcorn and pretzels. The items listed above may not be a complete list of recommended foods or beverages. Contact your dietitian for more options. WHAT FOODS ARE NOT RECOMMENDED? Grains White bread. White pasta. White rice. Refined cornbread. Bagels and croissants. Crackers that contain trans fat. Vegetables Creamed or fried vegetables. Vegetables in a cheese sauce. Regular canned vegetables. Regular canned tomato sauce and paste. Regular tomato and vegetable juices. Fruits Dried fruits. Canned fruit in light or heavy syrup. Fruit juice. Meat and Other Protein Products Fatty cuts of meat. Ribs, chicken wings, bacon, sausage, bologna, salami, chitterlings, fatback, hot dogs, bratwurst, and packaged luncheon meats. Salted nuts and seeds. Canned beans with salt. Dairy Whole or 2% milk, cream, half-and-half, and cream cheese. Whole-fat or sweetened yogurt. Full-fat cheeses or blue cheese. Nondairy creamers and whipped toppings. Processed cheese, cheese spreads, or cheese curds. Condiments Onion and garlic salt, seasoned salt, table salt, and sea salt. Canned and packaged gravies. Worcestershire sauce. Tartar sauce. Barbecue sauce. Teriyaki sauce. Soy sauce, including reduced sodium. Steak sauce. Fish sauce. Oyster sauce. Cocktail sauce. Horseradish. Ketchup and mustard. Meat flavorings and tenderizers. Bouillon cubes. Hot sauce. Tabasco sauce. Marinades. Taco seasonings. Relishes. Fats and Oils Butter, stick margarine, lard, shortening, ghee, and bacon fat. Coconut, palm kernel, or palm oils. Regular salad dressings. Other Pickles and olives. Salted popcorn and pretzels. The items listed above may not be a complete list of foods and beverages to avoid. Contact your dietitian for more information. WHERE CAN I FIND MORE INFORMATION? National Heart, Lung, and Blood Institute: travelstabloid.com   This  information is not intended to replace advice given to you by your health care provider. Make sure you discuss any questions you have with your health care provider.   Document Released: 11/24/2011 Document Revised: 12/26/2014 Document Reviewed: 10/09/2013 Elsevier Interactive Patient Education 2016 [employer]  Peripheral Edema You have swelling in your legs (peripheral edema). This swelling is due to excess accumulation of salt and water in your body. Edema may be a sign of heart, kidney or liver disease, or a side effect of a medication. It may also be due to problems in the leg veins. Elevating your legs and using special support stockings may be very helpful, if the cause of the swelling is due to poor venous circulation. Avoid long periods of standing, whatever the cause. Treatment of edema depends on identifying the cause. Chips, pretzels, pickles and other salty foods should be avoided. Restricting salt in your diet is almost always needed. Water pills (diuretics) are often used to remove the excess salt and water from your body via urine. These medicines prevent the kidney from reabsorbing sodium. This increases urine flow. Diuretic treatment may also result in lowering of potassium levels in your body. Potassium supplements may be needed if you have to use diuretics daily. Daily weights can help you keep track of your progress in clearing your edema. You should call your caregiver for follow up care as recommended. SEEK IMMEDIATE MEDICAL CARE IF:   You have increased swelling, pain, redness, or heat in your legs.  You develop shortness of breath, especially when lying down.  You develop chest or abdominal pain, weakness, or fainting.  You have a fever.   This information is not intended to replace advice given to you by your health care provider. Make sure you discuss any questions you have with your health care provider.   Document Released: 01/12/2005 Document Revised: 02/27/2012  Document Reviewed: 06/17/2015 Elsevier Interactive Patient Education [employer].

## 2016-04-18 LAB — PSA: PSA: 1.33 ng/mL (ref <=4.00)

## 2016-04-20 ENCOUNTER — Telehealth: Payer: Self-pay

## 2016-04-20 NOTE — Telephone Encounter (Signed)
-----   Message from Shawnee Knapp, MD sent at 04/17/2016  8:59 AM EDT ----- Please have pt sched an appt for a full physical with myself or Carlota Raspberry in about 2 months

## 2016-04-20 NOTE — Telephone Encounter (Signed)
I have schedule patient with Dr. Brigitte Pulse on 06/23/2016 @ 10:15 am.

## 2016-04-27 ENCOUNTER — Other Ambulatory Visit: Payer: Self-pay

## 2016-04-27 MED ORDER — AZELASTINE HCL 0.1 % NA SOLN: 2.0000 | Freq: Two times a day (BID) | NASAL | Status: DC

## 2016-06-23 ENCOUNTER — Encounter: Payer: Managed Care, Other (non HMO) | Admitting: Family Medicine

## 2016-06-23 NOTE — Progress Notes (Deleted)
   Subjective:    Patient ID: JAKEL CROUCH, male    DOB: 1966-07-27, 50 y.o.   MRN: [id number]  HPI  Mr. Ursini is a 50 yo male here today for a complete physical.  He was seen in the office 2 mos prior for an acute issue and had annual fasting screening labs drawn at that time in anticipation of today's visit.  He has not had a CPE prior in Epic  Primary Preventative Screening:  Prostate: CRS: Immunizations: ?  Lipids: ASCVD risk was 6.1% when flp was checked 2 mos prior with LDL 145 ->116 through tlc though needs to increase HDL which was 34 OSA: Edema: Pedal/dependent edema likely due to abd obesity, high salt, and low water intake. Responds well to prn lasix. Prior bnp in eval of edema nml Obesity: try to achieve weightloss through diet/exericse Elev BP: Macrocytosis: Had rec trial of oral B complex supp.  Review of Systems     Objective:   Physical Exam    Do DRE    Assessment & Plan:  Had baseline screening labs of cbc, cmp, psa, tsh, and lipid panel drawn 2 mos ago and all were normal.  Check vit B12 with next labs, HIV with next labs Needs tdap Refer for colonoscopy

## 2016-06-28 ENCOUNTER — Telehealth: Payer: Self-pay | Admitting: Family Medicine

## 2016-06-28 NOTE — Telephone Encounter (Signed)
Patient no-showed his complete physical.  Here are the notes I had made in preparation for his visit.  Bobby Mcguire is a 50 yo male here today for a complete physical.  He was seen in the office 2 mos prior for an acute issue and had annual fasting screening labs drawn at that time in anticipation of today's visit.  He has not had a CPE prior in Epic  Primary Preventative Screening:  Prostate: CRS: Immunizations: ?  Lipids: ASCVD risk was 6.1% when flp was checked 2 mos prior with LDL 145 ->116 through tlc though needs to increase HDL which was 34 OSA: Edema: Pedal/dependent edema likely due to abd obesity, high salt, and low water intake. Responds well to prn lasix. Prior bnp in eval of edema nml Obesity: try to achieve weightloss through diet/exericse Elev BP: Macrocytosis: Had rec trial of oral B complex supp.   Objective:      Rec doing DRE    Assessment & Plan:  Had baseline screening labs of cbc, cmp, psa, tsh, and lipid panel drawn 2 mos ago and all were normal.  Check vit B12 with next labs, HIV with next labs Needs tdap Refer for colonoscopy

## 2016-06-28 NOTE — Progress Notes (Signed)
This encounter was created in error - please disregard.

## 2016-08-13 ENCOUNTER — Other Ambulatory Visit: Payer: Self-pay | Admitting: Family Medicine

## 2016-08-13 DIAGNOSIS — N529 Male erectile dysfunction, unspecified: Secondary | ICD-10-CM

## 2016-08-15 NOTE — Telephone Encounter (Signed)
Dr L gave pt RFs for a year last Nov. I re-sent the remaining RFs.

## 2016-08-18 ENCOUNTER — Encounter: Payer: Self-pay | Admitting: Family Medicine

## 2016-08-18 ENCOUNTER — Ambulatory Visit (INDEPENDENT_AMBULATORY_CARE_PROVIDER_SITE_OTHER): Payer: Managed Care, Other (non HMO) | Admitting: Family Medicine

## 2016-08-18 VITALS — BP 128/80 | HR 69 | Temp 98.2°F | Resp 18 | Ht 76.5 in | Wt 326.0 lb

## 2016-08-18 DIAGNOSIS — Z23 Encounter for immunization: Secondary | ICD-10-CM

## 2016-08-18 DIAGNOSIS — Z130 Encounter for screening for diseases of the blood and blood-forming organs and certain disorders involving the immune mechanism: Secondary | ICD-10-CM | POA: Diagnosis not present

## 2016-08-18 DIAGNOSIS — Z131 Encounter for screening for diabetes mellitus: Secondary | ICD-10-CM | POA: Diagnosis not present

## 2016-08-18 DIAGNOSIS — Z1211 Encounter for screening for malignant neoplasm of colon: Secondary | ICD-10-CM | POA: Diagnosis not present

## 2016-08-18 DIAGNOSIS — Z0000 Encounter for general adult medical examination without abnormal findings: Secondary | ICD-10-CM

## 2016-08-18 DIAGNOSIS — Z13220 Encounter for screening for lipoid disorders: Secondary | ICD-10-CM | POA: Diagnosis not present

## 2016-08-18 DIAGNOSIS — R202 Paresthesia of skin: Secondary | ICD-10-CM

## 2016-08-18 DIAGNOSIS — Z800 Family history of malignant neoplasm of digestive organs: Secondary | ICD-10-CM | POA: Diagnosis not present

## 2016-08-18 DIAGNOSIS — E669 Obesity, unspecified: Secondary | ICD-10-CM | POA: Diagnosis not present

## 2016-08-18 DIAGNOSIS — R200 Anesthesia of skin: Secondary | ICD-10-CM

## 2016-08-18 LAB — COMPLETE METABOLIC PANEL WITH GFR
ALT: 24 U/L (ref 9–46)
AST: 17 U/L (ref 10–35)
Albumin: 4.5 g/dL (ref 3.6–5.1)
Alkaline Phosphatase: 49 U/L (ref 40–115)
BUN: 11 mg/dL (ref 7–25)
CO2: 26 mmol/L (ref 20–31)
Calcium: 9.8 mg/dL (ref 8.6–10.3)
Chloride: 104 mmol/L (ref 98–110)
Creat: 1.08 mg/dL (ref 0.70–1.33)
GFR, Est African American: >89 mL/min (ref >=60)
GFR, Est Non African American: 80 mL/min (ref >=60)
Glucose, Bld: 91 mg/dL (ref 65–99)
Potassium: 4.3 mmol/L (ref 3.5–5.3)
Sodium: 140 mmol/L (ref 135–146)
Total Bilirubin: 0.6 mg/dL (ref 0.2–1.2)
Total Protein: 7.3 g/dL (ref 6.1–8.1)

## 2016-08-18 LAB — LIPID PANEL
Cholesterol: 183 mg/dL (ref 125–200)
HDL: 40 mg/dL (ref >=40)
LDL Cholesterol: 122 mg/dL (ref <130)
Total CHOL/HDL Ratio: 4.6 Ratio (ref <=5.0)
Triglycerides: 103 mg/dL (ref <150)
VLDL: 21 mg/dL (ref <30)

## 2016-08-18 LAB — POCT CBC
Granulocyte percent: 61.2 %G (ref 37–80)
HCT, POC: 43.1 % — AB (ref 43.5–53.7)
Hemoglobin: 15 g/dL (ref 14.1–18.1)
Lymph, poc: 2.9 (ref 0.6–3.4)
MCH, POC: 33.8 pg — AB (ref 27–31.2)
MCHC: 34.9 g/dL (ref 31.8–35.4)
MCV: 97.1 fL — AB (ref 80–97)
MID (cbc): 0.2 (ref 0–0.9)
MPV: 7.6 fL (ref 0–99.8)
POC Granulocyte: 5 (ref 2–6.9)
POC LYMPH PERCENT: 36.3 %L (ref 10–50)
POC MID %: 2.5 %M (ref 0–12)
Platelet Count, POC: 259 10*3/uL (ref 142–424)
RBC: 4.43 M/uL — AB (ref 4.69–6.13)
RDW, POC: 12.6 %
WBC: 8.1 10*3/uL (ref 4.6–10.2)

## 2016-08-18 NOTE — Progress Notes (Signed)
By signing my name below, I, Bobby Mcguire, attest that this documentation has been prepared under the direction and in the presence of Bobby Mcguire.  Electronically Signed: Verlee Mcguire, Medical Scribe. 08/18/16. 9:04 AM.  Subjective:    Patient ID: Bobby Mcguire, male    DOB: 04/22/1966, 50 y.o.   MRN: [id number]  HPI Chief Complaint  Patient presents with  . Annual Exam    CPE    HPI Comments: Bobby Mcguire is a 50 y.o. male with a PMHx of OSA and obesity who presents to the [facility] for his complete annual physical. Pt is fasting this morning.  Blood in Stool: Pt reports seeing bright red stool mixed in his bowels occurring 5-6 weeks ago, but no other episodes after. Pt saw and reports anal itchiness, and finds relief by wiping/cleaning his anus with a cool wet rag. Pt isn't sure if he was constipated at the time of the episode.Thinks may have an anal fissure or hemorrhoids.  Vit B12: Started on Vit B12 supplement OTC due to elevated MCV previous visit Lab Results  Component Value Date   WBC 7.5 04/17/2016   HGB 14.9 04/17/2016   HCT 46.1 04/17/2016   MCV 100.4 (H) 04/17/2016   PLT 301 04/17/2016   Right Side Numbness: Pt reports intermittent numbness and tingling between his right hip and right knee that last a few seconds while he's laying down. Pt denies wearing a belt at the time of the episode. No low back symptoms, symptoms not across the knee. No rash  Lipid Screening: Lab Results  Component Value Date   CHOL 177 04/17/2016   HDL 34 (L) 04/17/2016   LDLCALC 116 04/17/2016   TRIG 134 04/17/2016   CHOLHDL 5.2 (H) 04/17/2016   Cancer Screening: Prostate CA: Lab Results  Component Value Date   PSA 1.33 04/17/2016  Colon CA: Pt has never had a colonoscopy before. FHx: Mom died of colon CA around 31 y/o. Skin CA: Pt reports darkened skin around his ankles. Pt denies FHx of skin cancer.  Obesity: He does take Lasix PRN for peripheral  edema. Wt Readings from Last 3 Encounters:  08/18/16 (!) 326 lb (147.9 kg)  04/17/16 (!) 340 lb (154.2 kg)  11/25/15 (!) 326 lb (147.9 kg)   OSA: He's on CPAP.  Immunizations: Pt would like to get his flu shot and tetanus shot today. Immunization History  Administered Date(s) Administered  . Influenza,inj,Quad PF,36+ Mos 08/22/2014   Vision:  Visual Acuity Screening   Right eye Left eye Both eyes  Without correction: 20/25 20/25 20/25   With correction:      Dentist: Pt has an appt every 6 months.  Exercise: Pt walks every day, occasionally swims, and occasionally works out at [employer].  Depression: Depression screen [facility] 2/9 08/18/2016 08/18/2016 04/17/2016 11/25/2015 11/23/2015  Decreased Interest 0 0 0 0 0  Down, Depressed, Hopeless 0 0 0 0 0  PHQ - 2 Score 0 0 0 0 0    Patient Active Problem List   Diagnosis Date Noted  . Obstructive sleep apnea 05/07/2010   Past Medical History:  Diagnosis Date  . Sleep apnea    No past surgical history on file. No Known Allergies Prior to Admission medications   Medication Sig Start Date End Date Taking? Authorizing Provider  Multiple Vitamin (MULTIVITAMIN WITH MINERALS) TABS tablet Take 1 tablet by mouth daily.   Yes Historical Provider, MD  Multiple Vitamins-Minerals (AIRBORNE PO) Take  1 packet by mouth as needed (to boost immune health.).    Yes Historical Provider, MD  VIAGRA 100 MG tablet TAKE ONE-HALF TO ONE TABLET (50 TO 100 MG TOTAL) DAILY AS NEEDED FOR ERECTILE DYSFUNCTION 08/15/16  Yes Robyn Haber, MD  azelastine (ASTELIN) 0.1 % nasal spray Place 2 sprays into both nostrils 2 (two) times daily. Use in each nostril as directed Patient not taking: Reported on 08/18/2016 04/27/16   Darlyne Russian, MD  furosemide (LASIX) 20 MG tablet Take 1 tablet (20 mg total) by mouth daily. As needed for lower leg swelling. Patient not taking: Reported on 08/18/2016 04/17/16   Shawnee Knapp, MD  magnesium oxide (MAG-OX) 400 MG tablet Take 400 mg by  mouth daily as needed (for excessive sweating to prevent cramping.).    Historical Provider, MD   Social History   Social History  . Marital status: Single    Spouse name: N/A  . Number of children: N/A  . Years of education: N/A   Occupational History  . Not on file.   Social History Main Topics  . Smoking status: Never Smoker  . Smokeless tobacco: Not on file  . Alcohol use Yes  . Drug use: No  . Sexual activity: Not on file   Other Topics Concern  . Not on file   Social History Narrative  . No narrative on file   Review of Systems  13 point ROS positive for blood in stool and numbness on his right side. Objective:  Physical Exam  Constitutional: He is oriented to person, place, and time. He appears well-developed and well-nourished.  HENT:  Head: Normocephalic and atraumatic.  Right Ear: External ear normal.  Left Ear: External ear normal.  Mouth/Throat: Oropharynx is clear and moist.  Eyes: Conjunctivae and EOM are normal. Pupils are equal, round, and reactive to light.  Neck: Normal range of motion. Neck supple. No thyromegaly present.  Cardiovascular: Normal rate, regular rhythm, normal heart sounds and intact distal pulses.   Pulmonary/Chest: Effort normal and breath sounds normal. No respiratory distress. He has no wheezes.  Abdominal: Soft. He exhibits no distension. There is no tenderness.  Genitourinary: Rectal exam shows no external hemorrhoid, no internal hemorrhoid and no fissure.  Genitourinary Comments: No external anal rash I did not do a prostate exam since his last one was in April  Musculoskeletal: Normal range of motion. He exhibits no edema or tenderness.  Minimal no pitting edema with stasis changes on the lower extremities Pain free ROM right hip unable to reproduce symptoms with right straight leg raise   Lymphadenopathy:    He has no cervical adenopathy.  Neurological: He is alert and oriented to person, place, and time. He has normal  reflexes.  Skin: Skin is warm and dry.  Psychiatric: He has a normal mood and affect. His behavior is normal.  Vitals reviewed.  BP 128/80   Pulse 69   Temp 98.2 F (36.8 C) (Oral)   Resp 18   Ht 6' 4.5" (1.943 m)   Wt (!) 326 lb (147.9 kg)   SpO2 97%   BMI 39.16 kg/m      Results for orders placed or performed in visit on 08/18/16  POCT CBC  Result Value Ref Range   WBC 8.1 4.6 - 10.2 K/uL   Lymph, poc 2.9 0.6 - 3.4   POC LYMPH PERCENT 36.3 10 - 50 %L   MID (cbc) 0.2 0 - 0.9   POC MID % 2.5  0 - 12 %M   POC Granulocyte 5.0 2 - 6.9   Granulocyte percent 61.2 37 - 80 %G   RBC 4.43 (A) 4.69 - 6.13 M/uL   Hemoglobin 15.0 14.1 - 18.1 g/dL   HCT, POC 43.1 (A) 43.5 - 53.7 %   MCV 97.1 (A) 80 - 97 fL   MCH, POC 33.8 (A) 27 - 31.2 pg   MCHC 34.9 31.8 - 35.4 g/dL   RDW, POC 12.6 %   Platelet Count, POC 259 142 - 424 K/uL   MPV 7.6 0 - 99.8 fL   Assessment & Plan:   AUNDRE PEPLOW is a 50 y.o. male Annual physical exam  --anticipatory guidance as below in AVS, screening labs above. Health maintenance items as above in HPI discussed/recommended as applicable.   Obesity  - Exercise and diet discussed.   Screen for colon cancer - Plan: Ambulatory referral to Gastroenterology Family history of colon cancer - Plan: Ambulatory referral to Gastroenterology  -Refer for colonoscopy. Prior bright red blood per rectum likely fissure or hemorrhoids, but testing deferred today as planning on colonoscopy in the near future. RTC precautions if symptoms return  Numbness and tingling of right leg  - Possible meralgia paresthetica versus less likely neuronal impingement from lumbar spine. Avoid constrictive clothing at waistline, and return to clinic for further evaluation if symptoms do not resolve.  Screening for diabetes mellitus - Plan: COMPLETE METABOLIC PANEL WITH GFR  Screening for hyperlipidemia - Plan: Lipid panel  Need for Tdap vaccination - Plan: Tdap vaccine greater than or  equal to 7yo IM given.   Needs flu shot - Plan: Flu Vaccine QUAD 36+ mos IM given  Screening for iron deficiency anemia - Plan: POCT CBC  - Repeat CBC to eval MCV, rule out anemia.  No orders of the defined types were placed in this encounter.  Patient Instructions    The itching around the anus may be due to hemorrhoids, which can also sometimes cause of bleeding. Fissures or tears can also cause bleeding. See other information on treatment below. If this persists, return for repeat exam and to discuss other medications.   I referred you to gastroenterology as you're overdue for colonoscopy, especially with family history of colon cancer.  The numbness and tingling on the side of your leg may be due to a pinched nerve along the abdominal wall called  "meralgia paresthetica", or from a pinched nerve in the back. If this persists, return for recheck and possible x-rays of your back.  In the meantime, avoid tight or constrictive clothing at the waistline, including any tight belts or elastic.   Anal Pruritus  Anal pruritus is an itchy feeling in the anus and the skin in the anal area. This is common and can be caused by many things. It often occurs when the area becomes moist. Moisture may be due to sweating or a small amount of stool (feces) that is left on the area because of poor personal cleaning. Some other causes include:  Perfumed soaps and sprays.  Colored toilet paper.  Chemicals in the foods that you eat.  Dietary factors, such as caffeine, beer, milk products, chocolate, nuts, citrus fruits, tomatoes, spicy seasonings, jalapeno peppers, and salsa.  Hemorrhoids, fissures, infections, and other anal diseases.  Excessive washing.  Overuse of laxatives.  Skin disorders (psoriasis, eczema, or seborrhea).  Some medical disorders, such as diabetes or thyroid problems.  Diarrhea.  STDs (sexually transmitted diseases).  Some cancers. In many  cases, the cause is not known.  The itching usually goes away with treatment and home care. Scratching can cause further skin damage. HOME CARE INSTRUCTIONS  Pay attention to any changes in your symptoms. Take these actions to help with your itching: Skin Care  Practice good hygiene.  Clean the anal area gently with wet toilet paper, baby wipes, or a wet washcloth after every bowel movement and at bedtime.  Avoid using soaps on the anal area.  Dry the area thoroughly. Pat the area dry with toilet paper or a towel.  Do not scrub the anal area with anything, including toilet paper.  Do not scratch the itchy area. Scratching produces more damage and makes the itching worse.  Take sitz baths in warm water as told by your health care provider. Pat the area dry with a soft cloth after each bath.  Use creams or ointments as told by your health care provider. Zinc oxide ointment or a moisture barrier cream can be applied several times per day to protect the skin.  Do not use anything that irritates the skin, such as bubble baths, scented toilet paper, or genital deodorants. General Instructions  Take over-the-counter and prescription medicines only as told by your health care provider.  Talk with your health care provider about fiber supplements. These are helpful in keeping your stool normal if you have frequent loose stools.  Wear cotton underwear and loose clothing.  Keep all follow-up visits as told by your health care provider. This is important. SEEK MEDICAL CARE IF:  Your itching does not improve in several days.  Your itching gets worse.  You have a fever.  You have redness, swelling, or pain in the anal area.  You have fluid, blood, or pus coming from the anal area.   This information is not intended to replace advice given to you by your health care provider. Make sure you discuss any questions you have with your health care provider.   Document Released: 06/06/2011 Document Revised: 08/26/2015  Document Reviewed: 03/02/2015 Elsevier Interactive Patient Education 2016 [place] you healthy  Get these tests  Blood pressure- Have your blood pressure checked once a year by your healthcare provider.  Normal blood pressure is 120/80  Weight- Have your body mass index (BMI) calculated to screen for obesity.  BMI is a measure of body fat based on height and weight. You can also calculate your own BMI at [email or web address].[email or web address].  Cholesterol- Have your cholesterol checked every year.  Diabetes- Have your blood sugar checked regularly if you have high blood pressure, high cholesterol, have a family history of diabetes or if you are overweight.  Screening for Colon Cancer- Colonoscopy starting at age 51.  Screening may begin sooner depending on your family history and other health conditions. Follow up colonoscopy as directed by your Gastroenterologist.  Screening for Prostate Cancer- Both blood work (PSA) and a rectal exam help screen for Prostate Cancer.  Screening begins at age 72 with African-American men and at age 54 with Caucasian men.  Screening may begin sooner depending on your family history.  Take these medicines  Aspirin- One aspirin daily can help prevent Heart disease and Stroke.  Flu shot- Every fall.  Tetanus- Every 10 years.  Zostavax- Once after the age of 75 to prevent Shingles.  Pneumonia shot- Once after the age of 13; if you are younger than 49, ask your healthcare provider if you need a Pneumonia shot.  Take these steps  Don't smoke-  If you do smoke, talk to your doctor about quitting.  For tips on how to quit, go to www.smokefree.gov or call 1-800-QUIT-NOW.  Be physically active- Exercise 5 days a week for at least 30 minutes.  If you are not already physically active start slow and gradually work up to 30 minutes of moderate physical activity.  Examples of moderate activity include walking briskly, mowing the yard, dancing, swimming,  bicycling, etc.  Eat a healthy diet- Eat a variety of healthy food such as fruits, vegetables, low fat milk, low fat cheese, yogurt, lean meant, poultry, fish, beans, tofu, etc. For more information go to www.thenutritionsource.org  Drink alcohol in moderation- Limit alcohol intake to less than two drinks a day. Never drink and drive.  Dentist- Brush and floss twice daily; visit your dentist twice a year.  Depression- Your emotional health is as important as your physical health. If you're feeling down, or losing interest in things you would normally enjoy please talk to your healthcare provider.  Eye exam- Visit your eye doctor every year.  Safe sex- If you may be exposed to a sexually transmitted infection, use a condom.  Seat belts- Seat belts can save your life; always wear one.  Smoke/Carbon Monoxide detectors- These detectors need to be installed on the appropriate level of your home.  Replace batteries at least once a year.  Skin cancer- When out in the sun, cover up and use sunscreen 15 SPF or higher.  Violence- If anyone is threatening you, please tell your healthcare provider.  Living Will/ Health care power of attorney- Speak with your healthcare provider and family.  IF you received an x-Mcguire today, you will receive an invoice from [facility]. Please contact [facility] at [phone] with questions or concerns regarding your invoice.   IF you received labwork today, you will receive an invoice from [employer]. Please contact [facility] at [phone] with questions or concerns regarding your invoice.   Our billing staff will not be able to assist you with questions regarding bills from these companies.  You will be contacted with the lab results as soon as they are available. The fastest way to get your results is to activate your My Chart account. Instructions are located on the last page of this paperwork. If you have not heard  from [place] regarding the results in 2 weeks, please contact this office.        I personally performed the services described in this documentation, which was scribed in my presence. The recorded information has been reviewed and considered, and addended by me as needed.   Signed,   Bobby Ray, MD [facility].  08/19/16 8:28 AM

## 2016-08-18 NOTE — Patient Instructions (Addendum)
The itching around the anus may be due to hemorrhoids, which can also sometimes cause of bleeding. Fissures or tears can also cause bleeding. See other information on treatment below. If this persists, return for repeat exam and to discuss other medications.   I referred you to gastroenterology as you're overdue for colonoscopy, especially with family history of colon cancer.  The numbness and tingling on the side of your leg may be due to a pinched nerve along the abdominal wall called  "meralgia paresthetica", or from a pinched nerve in the back. If this persists, return for recheck and possible x-rays of your back.  In the meantime, avoid tight or constrictive clothing at the waistline, including any tight belts or elastic.   Anal Pruritus  Anal pruritus is an itchy feeling in the anus and the skin in the anal area. This is common and can be caused by many things. It often occurs when the area becomes moist. Moisture may be due to sweating or a small amount of stool (feces) that is left on the area because of poor personal cleaning. Some other causes include:  Perfumed soaps and sprays.  Colored toilet paper.  Chemicals in the foods that you eat.  Dietary factors, such as caffeine, beer, milk products, chocolate, nuts, citrus fruits, tomatoes, spicy seasonings, jalapeno peppers, and salsa.  Hemorrhoids, fissures, infections, and other anal diseases.  Excessive washing.  Overuse of laxatives.  Skin disorders (psoriasis, eczema, or seborrhea).  Some medical disorders, such as diabetes or thyroid problems.  Diarrhea.  STDs (sexually transmitted diseases).  Some cancers. In many cases, the cause is not known. The itching usually goes away with treatment and home care. Scratching can cause further skin damage. HOME CARE INSTRUCTIONS  Pay attention to any changes in your symptoms. Take these actions to help with your itching: Skin Care  Practice good hygiene.  Clean the anal  area gently with wet toilet paper, baby wipes, or a wet washcloth after every bowel movement and at bedtime.  Avoid using soaps on the anal area.  Dry the area thoroughly. Pat the area dry with toilet paper or a towel.  Do not scrub the anal area with anything, including toilet paper.  Do not scratch the itchy area. Scratching produces more damage and makes the itching worse.  Take sitz baths in warm water as told by your health care provider. Pat the area dry with a soft cloth after each bath.  Use creams or ointments as told by your health care provider. Zinc oxide ointment or a moisture barrier cream can be applied several times per day to protect the skin.  Do not use anything that irritates the skin, such as bubble baths, scented toilet paper, or genital deodorants. General Instructions  Take over-the-counter and prescription medicines only as told by your health care provider.  Talk with your health care provider about fiber supplements. These are helpful in keeping your stool normal if you have frequent loose stools.  Wear cotton underwear and loose clothing.  Keep all follow-up visits as told by your health care provider. This is important. SEEK MEDICAL CARE IF:  Your itching does not improve in several days.  Your itching gets worse.  You have a fever.  You have redness, swelling, or pain in the anal area.  You have fluid, blood, or pus coming from the anal area.   This information is not intended to replace advice given to you by your health care provider. Make sure you  discuss any questions you have with your health care provider.   Document Released: 06/06/2011 Document Revised: 08/26/2015 Document Reviewed: 03/02/2015 Elsevier Interactive Patient Education 2016 [place] you healthy  Get these tests  Blood pressure- Have your blood pressure checked once a year by your healthcare provider.  Normal blood pressure is 120/80  Weight- Have your  body mass index (BMI) calculated to screen for obesity.  BMI is a measure of body fat based on height and weight. You can also calculate your own BMI at [email or web address].[email or web address].  Cholesterol- Have your cholesterol checked every year.  Diabetes- Have your blood sugar checked regularly if you have high blood pressure, high cholesterol, have a family history of diabetes or if you are overweight.  Screening for Colon Cancer- Colonoscopy starting at age 32.  Screening may begin sooner depending on your family history and other health conditions. Follow up colonoscopy as directed by your Gastroenterologist.  Screening for Prostate Cancer- Both blood work (PSA) and a rectal exam help screen for Prostate Cancer.  Screening begins at age 67 with African-American men and at age 18 with Caucasian men.  Screening may begin sooner depending on your family history.  Take these medicines  Aspirin- One aspirin daily can help prevent Heart disease and Stroke.  Flu shot- Every fall.  Tetanus- Every 10 years.  Zostavax- Once after the age of 32 to prevent Shingles.  Pneumonia shot- Once after the age of 40; if you are younger than 26, ask your healthcare provider if you need a Pneumonia shot.  Take these steps  Don't smoke- If you do smoke, talk to your doctor about quitting.  For tips on how to quit, go to www.smokefree.gov or call 1-800-QUIT-NOW.  Be physically active- Exercise 5 days a week for at least 30 minutes.  If you are not already physically active start slow and gradually work up to 30 minutes of moderate physical activity.  Examples of moderate activity include walking briskly, mowing the yard, dancing, swimming, bicycling, etc.  Eat a healthy diet- Eat a variety of healthy food such as fruits, vegetables, low fat milk, low fat cheese, yogurt, lean meant, poultry, fish, beans, tofu, etc. For more information go to www.thenutritionsource.org  Drink alcohol in moderation- Limit alcohol intake  to less than two drinks a day. Never drink and drive.  Dentist- Brush and floss twice daily; visit your dentist twice a year.  Depression- Your emotional health is as important as your physical health. If you're feeling down, or losing interest in things you would normally enjoy please talk to your healthcare provider.  Eye exam- Visit your eye doctor every year.  Safe sex- If you may be exposed to a sexually transmitted infection, use a condom.  Seat belts- Seat belts can save your life; always wear one.  Smoke/Carbon Monoxide detectors- These detectors need to be installed on the appropriate level of your home.  Replace batteries at least once a year.  Skin cancer- When out in the sun, cover up and use sunscreen 15 SPF or higher.  Violence- If anyone is threatening you, please tell your healthcare provider.  Living Will/ Health care power of attorney- Speak with your healthcare provider and family.  IF you received an x-ray today, you will receive an invoice from [facility]. Please contact [facility] at [phone] with questions or concerns regarding your invoice.   IF you received labwork today, you will receive an invoice from [employer]. Please contact [facility] at  [phone] with questions or concerns regarding your invoice.   Our billing staff will not be able to assist you with questions regarding bills from these companies.  You will be contacted with the lab results as soon as they are available. The fastest way to get your results is to activate your My Chart account. Instructions are located on the last page of this paperwork. If you have not heard from [place] regarding the results in 2 weeks, please contact this office.

## 2016-09-01 ENCOUNTER — Encounter: Payer: Self-pay | Admitting: Internal Medicine

## 2016-10-20 ENCOUNTER — Ambulatory Visit (AMBULATORY_SURGERY_CENTER): Payer: Self-pay

## 2016-10-20 VITALS — Ht 75.0 in | Wt 325.6 lb

## 2016-10-20 DIAGNOSIS — Z800 Family history of malignant neoplasm of digestive organs: Secondary | ICD-10-CM

## 2016-10-20 NOTE — Progress Notes (Signed)
Pt came into the office today for his pre-visit prior to his colon on 11/03/16 with Dr. Carlean Purl. Pt was not sure if he was going to have the colon now and wanted to think about it. He also had problems with getting a driver. He will cancel the colon on 11/03/16 and the PV today and call back in a couple weeks to reschedule.

## 2016-10-31 ENCOUNTER — Telehealth: Payer: Self-pay

## 2016-10-31 NOTE — Telephone Encounter (Signed)
PATIENT IS REQUESTING A REFILL ON HIS VIAGRA 100 MG. HE SAID LAST TIME HE GOT IT FILLED IT WAS BY DR. Joseph Art. BEST PHONE [phone] (CELL) PHARMACY CHOICE IS [facility].  [place]

## 2016-11-01 ENCOUNTER — Other Ambulatory Visit: Payer: Self-pay | Admitting: Family Medicine

## 2016-11-01 DIAGNOSIS — N529 Male erectile dysfunction, unspecified: Secondary | ICD-10-CM

## 2016-11-03 ENCOUNTER — Encounter: Payer: Self-pay | Admitting: Internal Medicine

## 2016-11-03 NOTE — Telephone Encounter (Signed)
This is duplicate of refill req which I forwarded to Dr Carlota Raspberry who saw pt recently for annual. Will leave this pending so that we can call pt to advise once reviewed.

## 2016-11-03 NOTE — Telephone Encounter (Signed)
Dr Carlota Raspberry, pt last got this written by Dr L, but see that you gave pt his annual recently. Do you want to Rx?

## 2016-11-04 ENCOUNTER — Telehealth: Payer: Self-pay

## 2016-11-04 NOTE — Telephone Encounter (Signed)
Pt is checking on status of refill request

## 2016-11-04 NOTE — Telephone Encounter (Signed)
Refilled, but follow-up within the next 3-6 months to discuss that medication further as we did not have a chance to discuss in the office.

## 2016-11-07 NOTE — Telephone Encounter (Signed)
LMOM (OK per HIPPA) that Rf was sent in but Dr Carlota Raspberry would like pt to f/up in 3-6 mos to discuss.

## 2016-11-08 NOTE — Telephone Encounter (Signed)
Pt was notified done.

## 2016-11-08 NOTE — Telephone Encounter (Signed)
Duplicate message. 

## 2016-12-13 ENCOUNTER — Telehealth: Payer: Self-pay

## 2016-12-13 NOTE — Telephone Encounter (Signed)
Patient dropped off [employer] form for completion ([employer])  [phone] (M)  Put in nurses box

## 2016-12-15 NOTE — Telephone Encounter (Signed)
Faxed form to [employer].  LM for patient

## 2016-12-31 ENCOUNTER — Telehealth: Payer: Self-pay

## 2016-12-31 NOTE — Telephone Encounter (Signed)
Error

## 2017-01-19 ENCOUNTER — Encounter: Payer: Managed Care, Other (non HMO) | Admitting: Internal Medicine

## 2017-02-10 ENCOUNTER — Ambulatory Visit (INDEPENDENT_AMBULATORY_CARE_PROVIDER_SITE_OTHER): Payer: Managed Care, Other (non HMO)

## 2017-02-10 ENCOUNTER — Ambulatory Visit (INDEPENDENT_AMBULATORY_CARE_PROVIDER_SITE_OTHER): Payer: Managed Care, Other (non HMO) | Admitting: Physician Assistant

## 2017-02-10 VITALS — BP 140/80 | HR 76 | Temp 98.1°F | Ht 75.0 in | Wt 329.2 lb

## 2017-02-10 DIAGNOSIS — M25461 Effusion, right knee: Secondary | ICD-10-CM | POA: Diagnosis not present

## 2017-02-10 DIAGNOSIS — N529 Male erectile dysfunction, unspecified: Secondary | ICD-10-CM

## 2017-02-10 DIAGNOSIS — M25561 Pain in right knee: Secondary | ICD-10-CM | POA: Diagnosis not present

## 2017-02-10 MED ORDER — SILDENAFIL CITRATE 100 MG PO TABS: ORAL_TABLET | ORAL | 6 refills | Status: DC

## 2017-02-10 MED ORDER — NAPROXEN 500 MG PO TABS: 500.0000 mg | ORAL_TABLET | Freq: Two times a day (BID) | ORAL | 0 refills | Status: DC

## 2017-02-10 NOTE — Progress Notes (Signed)
Bobby Mcguire  MRN: [id number] DOB: [birth year]  Subjective:  Bobby Mcguire is a 51 y.o. male seen in office today for a chief complaint of need of right knee drainage. He has hx of osteoarthritis for years and has recurrence of fluid in right knee. His last aspiration was one year ago. He needs to have a total knee replacement but states he cannot afford to have that right now. Denies redness, warmth, fever, and chills. Has no hx of gout. He has not tried NSAIDS for relief.    Also needs refill for viagra. He has used this for years. Denies any side effects. He has no cardiac history. Denies smoking and alcohol use.   Review of Systems  Respiratory: Negative for shortness of breath.   Cardiovascular: Negative for chest pain and palpitations.  Gastrointestinal: Negative for diarrhea, nausea and vomiting.    Patient Active Problem List   Diagnosis Date Noted  . Obesity 08/18/2016  . Obstructive sleep apnea 05/07/2010    Current Outpatient Prescriptions on File Prior to Visit  Medication Sig Dispense Refill  . magnesium oxide (MAG-OX) 400 MG tablet Take 400 mg by mouth daily as needed (for excessive sweating to prevent cramping.).    Marland Kitchen Multiple Vitamin (MULTIVITAMIN WITH MINERALS) TABS tablet Take 1 tablet by mouth daily.    . Multiple Vitamins-Minerals (AIRBORNE PO) Take 1 packet by mouth as needed (to boost immune health.).      No current facility-administered medications on file prior to visit.     No Known Allergies     Objective:  BP 140/80 (BP Location: Right Arm, Patient Position: Sitting, Cuff Size: Large)   Pulse 76   Temp 98.1 F (36.7 C) (Oral)   Ht 6\' 3"  (1.905 m)   Wt (!) 329 lb 3.2 oz (149.3 kg)   SpO2 98%   BMI 41.15 kg/m   Physical Exam  Constitutional: He is oriented to person, place, and time and well-developed, well-nourished, and in no distress.  HENT:  Head: Normocephalic and atraumatic.  Eyes: Conjunctivae are normal.  Neck: Normal range of  motion.  Cardiovascular: Normal rate, regular rhythm, normal heart sounds and intact distal pulses.   Pulmonary/Chest: Effort normal.  Musculoskeletal:       Right knee: He exhibits effusion (mild, suprapatellar aspect ). He exhibits normal range of motion. No tenderness found.       Left knee: Normal.  Neurological: He is alert and oriented to person, place, and time. Gait normal.  Skin: Skin is warm and dry.  Psychiatric: Affect normal.  Vitals reviewed.  Dg Knee Complete 4 Views Right  Result Date: 02/10/2017 CLINICAL DATA:  Pain and swelling right knee without known injury. EXAM: RIGHT KNEE - COMPLETE 4+ VIEW COMPARISON:  10/23/2010 FINDINGS: No fracture. No subluxation dislocation. Near complete loss of joint space noted medial compartment. Marked hypertrophic spurring is identified in the medial and patellofemoral compartments with more modest spurring laterally. Heterotopic ossification overlying the patella may be related to chronic trauma. Joint effusion visible in the suprapatellar bursa. IMPRESSION: Tricompartmental degenerative changes with joint effusion. Electronically Signed   By: Misty Stanley M.D.   On: 02/10/2017 10:47    Assessment and Plan :  1. Pain and swelling of knee, right -Although joint effusion is noted on PE and plain films, it does not appear that pt would benefit from a joint aspiration today as it appears mild in nature. Instructed to use naproxen BID, ice daily, and ACE compression, which  was applied in office today. Discussed the severity of OA with patient and he is willing to see his orthopedic in the near future to discuss further treatment options.  - DG Knee Complete 4 Views Right; Future - naproxen (NAPROSYN) 500 MG tablet; Take 1 tablet (500 mg total) by mouth 2 (two) times daily with a meal.  Dispense: 30 tablet; Refill: 0  2. Erectile dysfunction, unspecified erectile dysfunction type viagra Rx given - pt instructed to use lowest effective dose. Side  effects discussed (including but not limited to headache/flushing, blue discoloration of vision, possible vascular steal and risk of cardiac effects if underlying unknown coronary artery disease, and permanent sensorineural hearing loss). Understanding expressed. - sildenafil (VIAGRA) 100 MG tablet; TAKE ONE-HALF (1/2) TO 1 TABLET DAILY AS NEEDED FOR ERECTILE DYSFUNCTION  Dispense: 8 tablet; Refill: [place] PA-C  [facility] 02/10/2017 1:22 PM

## 2017-02-10 NOTE — Patient Instructions (Addendum)
Keep compression on knee while the swelling is there especially when you are working. You also need to take naproxen daily for the next week. Try to ice it at home when you are sitting down.    For viagra, try a half tablet, the max you can take at once is one whole tablet.  It was a pleasure meeting you. Good luck with everything! If you ever need anything come back and see [place]!   Knee Effusion Introduction Knee effusion means that you have extra fluid in your knee. This can cause pain. Your knee may be more difficult to bend and move. Follow these instructions at home:  Use crutches as told by your doctor.  Wear a knee brace as told by your doctor.  Apply ice to the swollen area:  Put ice in a plastic bag.  Place a towel between your skin and the bag.  Leave the ice on for 20 minutes, 2-3 times per day.  Keep your knee raised (elevated) when you are sitting or lying down.  Take medicines only as told by your doctor.  Do any rehabilitation or strengthening exercises as told by your doctor.  Rest your knee as told by your doctor. You may start doing your normal activities again when your doctor says it is okay.  Keep all follow-up visits as told by your doctor. This is important. Contact a doctor if:  You continue to have pain in your knee. Get help right away if:  You have increased swelling or redness of your knee.  You have severe pain in your knee.  You have a fever. This information is not intended to replace advice given to you by your health care provider. Make sure you discuss any questions you have with your health care provider. Document Released: 01/07/2011 Document Revised: 05/12/2016 Document Reviewed: 07/21/2014  2017 Elsevier     IF you received an x-ray today, you will receive an invoice from [facility]. Please contact [facility] at [phone] with questions or concerns regarding your invoice.   IF you received labwork today,  you will receive an invoice from [facility]. Please contact [facility] at [phone] with questions or concerns regarding your invoice.   Our billing staff will not be able to assist you with questions regarding bills from these companies.  You will be contacted with the lab results as soon as they are available. The fastest way to get your results is to activate your My Chart account. Instructions are located on the last page of this paperwork. If you have not heard from [place] regarding the results in 2 weeks, please contact this office.

## 2017-03-02 ENCOUNTER — Ambulatory Visit: Payer: Managed Care, Other (non HMO) | Admitting: Family Medicine

## 2017-04-28 ENCOUNTER — Encounter: Payer: Managed Care, Other (non HMO) | Admitting: Internal Medicine

## 2017-05-04 ENCOUNTER — Other Ambulatory Visit: Payer: Self-pay | Admitting: Family Medicine

## 2017-05-04 DIAGNOSIS — N529 Male erectile dysfunction, unspecified: Secondary | ICD-10-CM

## 2017-05-05 NOTE — Telephone Encounter (Signed)
02/10/17 last ov

## 2017-05-08 NOTE — Telephone Encounter (Signed)
Could you please call and ask if patient needs a refill? I gave him 6 refills on this Rx in February. Thanks!

## 2017-05-18 NOTE — Telephone Encounter (Signed)
Left message for pt to call back  °

## 2017-05-18 NOTE — Telephone Encounter (Signed)
Patient called back stating he never got the RX that were sent to [facility] in Feb because he doesn't use that pharmacy he uses [facility] and want them sent there. States they sent a form over for Dr Carlota Raspberry to sign and send back to them but it has been two week and they have not gotten it.  So if someone could call this in or fax it to [facility] for him.

## 2017-05-19 NOTE — Telephone Encounter (Signed)
I have approved the Rx. Could you please call and let pt know. Thanks!

## 2017-06-06 DIAGNOSIS — R030 Elevated blood-pressure reading, without diagnosis of hypertension: Secondary | ICD-10-CM | POA: Insufficient documentation

## 2017-06-06 DIAGNOSIS — Z1211 Encounter for screening for malignant neoplasm of colon: Secondary | ICD-10-CM | POA: Insufficient documentation

## 2017-06-22 ENCOUNTER — Encounter: Payer: Self-pay | Admitting: Emergency Medicine

## 2017-06-22 ENCOUNTER — Ambulatory Visit (INDEPENDENT_AMBULATORY_CARE_PROVIDER_SITE_OTHER): Payer: Managed Care, Other (non HMO) | Admitting: Emergency Medicine

## 2017-06-22 VITALS — BP 183/102 | HR 87 | Temp 98.0°F | Resp 16 | Ht 75.0 in | Wt 342.0 lb

## 2017-06-22 DIAGNOSIS — I10 Essential (primary) hypertension: Secondary | ICD-10-CM

## 2017-06-22 DIAGNOSIS — M13162 Monoarthritis, not elsewhere classified, left knee: Secondary | ICD-10-CM | POA: Diagnosis not present

## 2017-06-22 MED ORDER — PREDNISONE 20 MG PO TABS: 40.0000 mg | ORAL_TABLET | Freq: Every day | ORAL | 0 refills | Status: AC

## 2017-06-22 MED ORDER — AMLODIPINE BESYLATE 5 MG PO TABS: 5.0000 mg | ORAL_TABLET | Freq: Every day | ORAL | 3 refills | Status: DC

## 2017-06-22 NOTE — Progress Notes (Signed)
Bobby Mcguire 51 y.o.   Chief Complaint  Patient presents with  . Knee Pain    left knee pain x 3 week   . Hypertension    HISTORY OF PRESENT ILLNESS: This is a 51 y.o. male complaining of left knee pain and inflammation following mild sprain recently; BP has been elevated every time he sees Doctor.  HPI   Prior to Admission medications   Medication Sig Start Date End Date Taking? Authorizing Provider  meloxicam (MOBIC) 15 MG tablet Take 15 mg by mouth once. 06/06/17 06/06/18 Yes [provider]  Multiple Vitamin (MULTIVITAMIN WITH MINERALS) TABS tablet Take 1 tablet by mouth daily.   Yes [provider]  VIAGRA 100 MG tablet TAKE ONE-HALF (1/2) TO ONE TABLET DAILY AS NEEDED FOR ERECTILE DYSFUNCTION 05/19/17  Yes Timmothy Euler, [place] D, PA-C  magnesium oxide (MAG-OX) 400 MG tablet Take 400 mg by mouth daily as needed (for excessive sweating to prevent cramping.).    [provider]  Multiple Vitamins-Minerals (AIRBORNE PO) Take 1 packet by mouth as needed (to boost immune health.).     [provider]  naproxen (NAPROSYN) 500 MG tablet Take 1 tablet (500 mg total) by mouth 2 (two) times daily with a meal. Patient not taking: Reported on 06/22/2017 02/10/17   Tenna Delaine D, PA-C    No Known Allergies  Patient Active Problem List   Diagnosis Date Noted  . Obesity 08/18/2016  . Obstructive sleep apnea 05/07/2010    Past Medical History:  Diagnosis Date  . Sleep apnea     No past surgical history on file.  Social History   Social History  . Marital status: Single    Spouse name: N/A  . Number of children: N/A  . Years of education: N/A   Occupational History  . Not on file.   Social History Main Topics  . Smoking status: Never Smoker  . Smokeless tobacco: Never Used  . Alcohol use 1.8 oz/week    3 Glasses of wine per week  . Drug use: No  . Sexual activity: Not on file   Other Topics Concern  . Not on file   Social History  Narrative  . No narrative on file    Family History  Problem Relation Age of Onset  . Cancer Mother      Review of Systems  Constitutional: Negative.  Negative for chills and fever.  HENT: Negative.   Eyes: Negative.  Negative for blurred vision and double vision.  Respiratory: Negative.  Negative for cough and shortness of breath.   Cardiovascular: Negative.  Negative for chest pain and palpitations.  Gastrointestinal: Negative.  Negative for abdominal pain, diarrhea, nausea and vomiting.  Genitourinary: Negative.   Musculoskeletal: Positive for joint pain (left knee).  Skin: Negative.   Neurological: Negative.  Negative for dizziness, sensory change, focal weakness and headaches.  All other systems reviewed and are negative.   BP Readings from Last 3 Encounters:  06/22/17 (!) 183/102  02/10/17 140/80  08/18/16 128/80    Physical Exam  Constitutional: He is oriented to person, place, and time. He appears well-developed and well-nourished.  HENT:  Head: Normocephalic and atraumatic.  Right Ear: External ear normal.  Left Ear: External ear normal.  Nose: Nose normal.  Mouth/Throat: Oropharynx is clear and moist.  Eyes: Conjunctivae and EOM are normal. Pupils are equal, round, and reactive to light.  Neck: Normal range of motion. Neck supple. No JVD present. No thyromegaly present.  Cardiovascular:  Normal rate, regular rhythm, normal heart sounds and intact distal pulses.   Pulmonary/Chest: Effort normal and breath sounds normal.  Abdominal: Soft. Bowel sounds are normal. There is no tenderness.  Musculoskeletal: Normal range of motion.  Left knee: No erythema, mild swelling, FROM, non-tender.  Lymphadenopathy:    He has no cervical adenopathy.  Neurological: He is alert and oriented to person, place, and time. No sensory deficit. He exhibits normal muscle tone.  Skin: Skin is warm and dry. Capillary refill takes less than 2 seconds. No rash noted.  Psychiatric: He has  a normal mood and affect. His behavior is normal.  Vitals reviewed.    ASSESSMENT & PLAN: Bobby Mcguire was seen today for knee pain and hypertension.  Diagnoses and all orders for this visit:  Inflammation of joint of left knee -     predniSONE (DELTASONE) 20 MG tablet; Take 2 tablets (40 mg total) by mouth daily with breakfast.  Essential hypertension -     amLODipine (NORVASC) 5 MG tablet; Take 1 tablet (5 mg total) by mouth daily. -     CBC with Differential/Platelet -     Lipid panel -     Comprehensive metabolic panel -     Hemoglobin A1c    Patient Instructions       IF you received an x-ray today, you will receive an invoice from [facility]. Please contact [facility] at [phone] with questions or concerns regarding your invoice.   IF you received labwork today, you will receive an invoice from [facility]. Please contact [facility] at [phone] with questions or concerns regarding your invoice.   Our billing staff will not be able to assist you with questions regarding bills from these companies.  You will be contacted with the lab results as soon as they are available. The fastest way to get your results is to activate your My Chart account. Instructions are located on the last page of this paperwork. If you have not heard from [place] regarding the results in 2 weeks, please contact this office.      Hypertension Hypertension is another name for high blood pressure. High blood pressure forces your heart to work harder to pump blood. This can cause problems over time. There are two numbers in a blood pressure reading. There is a top number (systolic) over a bottom number (diastolic). It is best to have a blood pressure below 120/80. Healthy choices can help lower your blood pressure. You may need medicine to help lower your blood pressure if:  Your blood pressure cannot be lowered with healthy choices.  Your blood pressure is higher than  130/80.  Follow these instructions at home: Eating and drinking  If directed, follow the DASH eating plan. This diet includes: ? Filling half of your plate at each meal with fruits and vegetables. ? Filling one quarter of your plate at each meal with whole grains. Whole grains include whole wheat pasta, brown rice, and whole grain bread. ? Eating or drinking low-fat dairy products, such as skim milk or low-fat yogurt. ? Filling one quarter of your plate at each meal with low-fat (lean) proteins. Low-fat proteins include fish, skinless chicken, eggs, beans, and tofu. ? Avoiding fatty meat, cured and processed meat, or chicken with skin. ? Avoiding premade or processed food.  Eat less than 1,500 mg of salt (sodium) a day.  Limit alcohol use to no more than 1 drink a day for nonpregnant women and 2 drinks a day for men. One  drink equals 12 oz of beer, 5 oz of wine, or 1 oz of hard liquor. Lifestyle  Work with your doctor to stay at a healthy weight or to lose weight. Ask your doctor what the best weight is for you.  Get at least 30 minutes of exercise that causes your heart to beat faster (aerobic exercise) most days of the week. This may include walking, swimming, or biking.  Get at least 30 minutes of exercise that strengthens your muscles (resistance exercise) at least 3 days a week. This may include lifting weights or pilates.  Do not use any products that contain nicotine or tobacco. This includes cigarettes and e-cigarettes. If you need help quitting, ask your doctor.  Check your blood pressure at home as told by your doctor.  Keep all follow-up visits as told by your doctor. This is important. Medicines  Take over-the-counter and prescription medicines only as told by your doctor. Follow directions carefully.  Do not skip doses of blood pressure medicine. The medicine does not work as well if you skip doses. Skipping doses also puts you at risk for problems.  Ask your doctor  about side effects or reactions to medicines that you should watch for. Contact a doctor if:  You think you are having a reaction to the medicine you are taking.  You have headaches that keep coming back (recurring).  You feel dizzy.  You have swelling in your ankles.  You have trouble with your vision. Get help right away if:  You get a very bad headache.  You start to feel confused.  You feel weak or numb.  You feel faint.  You get very bad pain in your: ? Chest. ? Belly (abdomen).  You throw up (vomit) more than once.  You have trouble breathing. Summary  Hypertension is another name for high blood pressure.  Making healthy choices can help lower blood pressure. If your blood pressure cannot be controlled with healthy choices, you may need to take medicine. This information is not intended to replace advice given to you by your health care provider. Make sure you discuss any questions you have with your health care provider. Document Released: 05/23/2008 Document Revised: 11/02/2016 Document Reviewed: 11/02/2016 Elsevier Interactive Patient Education  2018 [employer].  How to Take Your Blood Pressure You can take your blood pressure at home with a machine. You may need to check your blood pressure at home:  To check if you have high blood pressure (hypertension).  To check your blood pressure over time.  To make sure your blood pressure medicine is working.  Supplies needed: You will need a blood pressure machine, or monitor. You can buy one at a drugstore or online. When choosing one:  Choose one with an arm cuff.  Choose one that wraps around your upper arm. Only one finger should fit between your arm and the cuff.  Do not choose one that measures your blood pressure from your wrist or finger.  Your doctor can suggest a monitor. How to prepare Avoid these things for 30 minutes before checking your blood pressure:  Drinking caffeine.  Drinking  alcohol.  Eating.  Smoking.  Exercising.  Five minutes before checking your blood pressure:  Pee.  Sit in a dining chair. Avoid sitting in a soft couch or armchair.  Be quiet. Do not talk.  How to take your blood pressure Follow the instructions that came with your machine. If you have a digital blood pressure monitor, these may  be the instructions: 1. Sit up straight. 2. Place your feet on the floor. Do not cross your ankles or legs. 3. Rest your left arm at the level of your heart. You may rest it on a table, desk, or chair. 4. Pull up your shirt sleeve. 5. Wrap the blood pressure cuff around the upper part of your left arm. The cuff should be 1 inch (2.5 cm) above your elbow. It is best to wrap the cuff around bare skin. 6. Fit the cuff snugly around your arm. You should be able to place only one finger between the cuff and your arm. 7. Put the cord inside the groove of your elbow. 8. Press the power button. 9. Sit quietly while the cuff fills with air and loses air. 10. Write down the numbers on the screen. 11. Wait 2-3 minutes and then repeat steps 1-10.  What do the numbers mean? Two numbers make up your blood pressure. The first number is called systolic pressure. The second is called diastolic pressure. An example of a blood pressure reading is "120 over 80" (or 120/80). If you are an adult and do not have a medical condition, use this guide to find out if your blood pressure is normal: Normal  First number: below 120.  Second number: below 80. Elevated  First number: 120-129.  Second number: below 80. Hypertension stage 1  First number: 130-139.  Second number: 80-89. Hypertension stage 2  First number: 140 or above.  Second number: 39 or above. Your blood pressure is above normal even if only the top or bottom number is above normal. Follow these instructions at home:  Check your blood pressure as often as your doctor tells you to.  Take your  monitor to your next doctor's appointment. Your doctor will: ? Make sure you are using it correctly. ? Make sure it is working right.  Make sure you understand what your blood pressure numbers should be.  Tell your doctor if your medicines are causing side effects. Contact a doctor if:  Your blood pressure keeps being high. Get help right away if:  Your first blood pressure number is higher than 180.  Your second blood pressure number is higher than 120. This information is not intended to replace advice given to you by your health care provider. Make sure you discuss any questions you have with your health care provider. Document Released: 11/17/2008 Document Revised: 11/02/2016 Document Reviewed: 05/13/2016 Elsevier Interactive Patient Education  2018 [place] Eating Plan DASH stands for "Dietary Approaches to Stop Hypertension." The DASH eating plan is a healthy eating plan that has been shown to reduce high blood pressure (hypertension). It may also reduce your risk for type 2 diabetes, heart disease, and stroke. The DASH eating plan may also help with weight loss. What are tips for following this plan? General guidelines  Avoid eating more than 2,300 mg (milligrams) of salt (sodium) a day. If you have hypertension, you may need to reduce your sodium intake to 1,500 mg a day.  Limit alcohol intake to no more than 1 drink a day for nonpregnant women and 2 drinks a day for men. One drink equals 12 oz of beer, 5 oz of wine, or 1 oz of hard liquor.  Work with your health care provider to maintain a healthy body weight or to lose weight. Ask what an ideal weight is for you.  Get at least 30 minutes of exercise that causes your heart to beat  faster (aerobic exercise) most days of the week. Activities may include walking, swimming, or biking.  Work with your health care provider or diet and nutrition specialist (dietitian) to adjust your eating plan to your individual  calorie needs. Reading food labels  Check food labels for the amount of sodium per serving. Choose foods with less than 5 percent of the Daily Value of sodium. Generally, foods with less than 300 mg of sodium per serving fit into this eating plan.  To find whole grains, look for the word "whole" as the first word in the ingredient list. Shopping  Buy products labeled as "low-sodium" or "no salt added."  Buy fresh foods. Avoid canned foods and premade or frozen meals. Cooking  Avoid adding salt when cooking. Use salt-free seasonings or herbs instead of table salt or sea salt. Check with your health care provider or pharmacist before using salt substitutes.  Do not fry foods. Cook foods using healthy methods such as baking, boiling, grilling, and broiling instead.  Cook with heart-healthy oils, such as olive, canola, soybean, or sunflower oil. Meal planning   Eat a balanced diet that includes: ? 5 or more servings of fruits and vegetables each day. At each meal, try to fill half of your plate with fruits and vegetables. ? Up to 6-8 servings of whole grains each day. ? Less than 6 oz of lean meat, poultry, or fish each day. A 3-oz serving of meat is about the same size as a deck of cards. One egg equals 1 oz. ? 2 servings of low-fat dairy each day. ? A serving of nuts, seeds, or beans 5 times each week. ? Heart-healthy fats. Healthy fats called Omega-3 fatty acids are found in foods such as flaxseeds and coldwater fish, like sardines, salmon, and mackerel.  Limit how much you eat of the following: ? Canned or prepackaged foods. ? Food that is high in trans fat, such as fried foods. ? Food that is high in saturated fat, such as fatty meat. ? Sweets, desserts, sugary drinks, and other foods with added sugar. ? Full-fat dairy products.  Do not salt foods before eating.  Try to eat at least 2 vegetarian meals each week.  Eat more home-cooked food and less restaurant, buffet, and fast  food.  When eating at a restaurant, ask that your food be prepared with less salt or no salt, if possible. What foods are recommended? The items listed may not be a complete list. Talk with your dietitian about what dietary choices are best for you. Grains Whole-grain or whole-wheat bread. Whole-grain or whole-wheat pasta. Brown rice. [place]. Bulgur. Whole-grain and low-sodium cereals. Pita bread. Low-fat, low-sodium crackers. Whole-wheat flour tortillas. Vegetables Fresh or frozen vegetables (raw, steamed, roasted, or grilled). Low-sodium or reduced-sodium tomato and vegetable juice. Low-sodium or reduced-sodium tomato sauce and tomato paste. Low-sodium or reduced-sodium canned vegetables. Fruits All fresh, dried, or frozen fruit. Canned fruit in natural juice (without added sugar). Meat and other protein foods Skinless chicken or [place]. Ground chicken or [place]. Pork with fat trimmed off. Fish and seafood. Egg whites. Dried beans, peas, or lentils. Unsalted nuts, nut butters, and seeds. Unsalted canned beans. Lean cuts of beef with fat trimmed off. Low-sodium, lean deli meat. Dairy Low-fat (1%) or fat-free (skim) milk. Fat-free, low-fat, or reduced-fat cheeses. Nonfat, low-sodium ricotta or cottage cheese. Low-fat or nonfat yogurt. Low-fat, low-sodium cheese. Fats and oils Soft margarine without trans fats. Vegetable oil. Low-fat, reduced-fat, or light mayonnaise and salad dressings (reduced-sodium).  Canola, safflower, olive, soybean, and sunflower oils. Avocado. Seasoning and other foods Herbs. Spices. Seasoning mixes without salt. Unsalted popcorn and pretzels. Fat-free sweets. What foods are not recommended? The items listed may not be a complete list. Talk with your dietitian about what dietary choices are best for you. Grains Baked goods made with fat, such as croissants, muffins, or some breads. Dry pasta or rice meal packs. Vegetables Creamed or fried vegetables. Vegetables  in a cheese sauce. Regular canned vegetables (not low-sodium or reduced-sodium). Regular canned tomato sauce and paste (not low-sodium or reduced-sodium). Regular tomato and vegetable juice (not low-sodium or reduced-sodium). Angie Fava. Olives. Fruits Canned fruit in a light or heavy syrup. Fried fruit. Fruit in cream or butter sauce. Meat and other protein foods Fatty cuts of meat. Ribs. Fried meat. Berniece Salines. Sausage. Bologna and other processed lunch meats. Salami. Fatback. Hotdogs. Bratwurst. Salted nuts and seeds. Canned beans with added salt. Canned or smoked fish. Whole eggs or egg yolks. Chicken or [place] with skin. Dairy Whole or 2% milk, cream, and half-and-half. Whole or full-fat cream cheese. Whole-fat or sweetened yogurt. Full-fat cheese. Nondairy creamers. Whipped toppings. Processed cheese and cheese spreads. Fats and oils Butter. Stick margarine. Lard. Shortening. Ghee. Bacon fat. Tropical oils, such as coconut, palm kernel, or palm oil. Seasoning and other foods Salted popcorn and pretzels. Onion salt, garlic salt, seasoned salt, table salt, and sea salt. Worcestershire sauce. Tartar sauce. Barbecue sauce. Teriyaki sauce. Soy sauce, including reduced-sodium. Steak sauce. Canned and packaged gravies. Fish sauce. Oyster sauce. Cocktail sauce. Horseradish that you find on the shelf. Ketchup. Mustard. Meat flavorings and tenderizers. Bouillon cubes. Hot sauce and Tabasco sauce. Premade or packaged marinades. Premade or packaged taco seasonings. Relishes. Regular salad dressings. Where to find more information:  National Heart, Lung, and [place]: https://wilson-eaton.com/  American Heart Association: www.heart.org Summary  The DASH eating plan is a healthy eating plan that has been shown to reduce high blood pressure (hypertension). It may also reduce your risk for type 2 diabetes, heart disease, and stroke.  With the DASH eating plan, you should limit salt (sodium) intake to 2,300 mg a  day. If you have hypertension, you may need to reduce your sodium intake to 1,500 mg a day.  When on the DASH eating plan, aim to eat more fresh fruits and vegetables, whole grains, lean proteins, low-fat dairy, and heart-healthy fats.  Work with your health care provider or diet and nutrition specialist (dietitian) to adjust your eating plan to your individual calorie needs. This information is not intended to replace advice given to you by your health care provider. Make sure you discuss any questions you have with your health care provider. Document Released: 11/24/2011 Document Revised: 11/28/2016 Document Reviewed: 11/28/2016 Elsevier Interactive Patient Education  2017 [employer]      Agustina Caroli, MD [facility]

## 2017-06-22 NOTE — Patient Instructions (Addendum)
   IF you received an x-ray today, you will receive an invoice from [facility]. Please contact [facility] at [phone] with questions or concerns regarding your invoice.   IF you received labwork today, you will receive an invoice from [facility]. Please contact [facility] at [phone] with questions or concerns regarding your invoice.   Our billing staff will not be able to assist you with questions regarding bills from these companies.  You will be contacted with the lab results as soon as they are available. The fastest way to get your results is to activate your My Chart account. Instructions are located on the last page of this paperwork. If you have not heard from us regarding the results in 2 weeks, please contact this office.     Hypertension Hypertension is another name for high blood pressure. High blood pressure forces your heart to work harder to pump blood. This can cause problems over time. There are two numbers in a blood pressure reading. There is a top number (systolic) over a bottom number (diastolic). It is best to have a blood pressure below 120/80. Healthy choices can help lower your blood pressure. You may need medicine to help lower your blood pressure if:  Your blood pressure cannot be lowered with healthy choices.  Your blood pressure is higher than 130/80.  Follow these instructions at home: Eating and drinking  If directed, follow the DASH eating plan. This diet includes: ? Filling half of your plate at each meal with fruits and vegetables. ? Filling one quarter of your plate at each meal with whole grains. Whole grains include whole wheat pasta, brown rice, and whole grain bread. ? Eating or drinking low-fat dairy products, such as skim milk or low-fat yogurt. ? Filling one quarter of your plate at each meal with low-fat (lean) proteins. Low-fat proteins include fish, skinless chicken, eggs, beans, and tofu. ? Avoiding fatty meat, cured  and processed meat, or chicken with skin. ? Avoiding premade or processed food.  Eat less than 1,500 mg of salt (sodium) a day.  Limit alcohol use to no more than 1 drink a day for nonpregnant women and 2 drinks a day for men. One drink equals 12 oz of beer, 5 oz of wine, or 1 oz of hard liquor. Lifestyle  Work with your doctor to stay at a healthy weight or to lose weight. Ask your doctor what the best weight is for you.  Get at least 30 minutes of exercise that causes your heart to beat faster (aerobic exercise) most days of the week. This may include walking, swimming, or biking.  Get at least 30 minutes of exercise that strengthens your muscles (resistance exercise) at least 3 days a week. This may include lifting weights or pilates.  Do not use any products that contain nicotine or tobacco. This includes cigarettes and e-cigarettes. If you need help quitting, ask your doctor.  Check your blood pressure at home as told by your doctor.  Keep all follow-up visits as told by your doctor. This is important. Medicines  Take over-the-counter and prescription medicines only as told by your doctor. Follow directions carefully.  Do not skip doses of blood pressure medicine. The medicine does not work as well if you skip doses. Skipping doses also puts you at risk for problems.  Ask your doctor about side effects or reactions to medicines that you should watch for. Contact a doctor if:  You think you are having a reaction to the   the medicine you are taking.  You have headaches that keep coming back (recurring).  You feel dizzy.  You have swelling in your ankles.  You have trouble with your vision. Get help right away if:  You get a very bad headache.  You start to feel confused.  You feel weak or numb.  You feel faint.  You get very bad pain in your: ? Chest. ? Belly (abdomen).  You throw up (vomit) more than once.  You have trouble  breathing. Summary  Hypertension is another name for high blood pressure.  Making healthy choices can help lower blood pressure. If your blood pressure cannot be controlled with healthy choices, you may need to take medicine. This information is not intended to replace advice given to you by your health care provider. Make sure you discuss any questions you have with your health care provider. Document Released: 05/23/2008 Document Revised: 11/02/2016 Document Reviewed: 11/02/2016 Elsevier Interactive Patient Education  2018 [employer].  How to Take Your Blood Pressure You can take your blood pressure at home with a machine. You may need to check your blood pressure at home:  To check if you have high blood pressure (hypertension).  To check your blood pressure over time.  To make sure your blood pressure medicine is working.  Supplies needed: You will need a blood pressure machine, or monitor. You can buy one at a drugstore or online. When choosing one:  Choose one with an arm cuff.  Choose one that wraps around your upper arm. Only one finger should fit between your arm and the cuff.  Do not choose one that measures your blood pressure from your wrist or finger.  Your doctor can suggest a monitor. How to prepare Avoid these things for 30 minutes before checking your blood pressure:  Drinking caffeine.  Drinking alcohol.  Eating.  Smoking.  Exercising.  Five minutes before checking your blood pressure:  Pee.  Sit in a dining chair. Avoid sitting in a soft couch or armchair.  Be quiet. Do not talk.  How to take your blood pressure Follow the instructions that came with your machine. If you have a digital blood pressure monitor, these may be the instructions: 1. Sit up straight. 2. Place your feet on the floor. Do not cross your ankles or legs. 3. Rest your left arm at the level of your heart. You may rest it on a table, desk, or chair. 4. Pull up your shirt  sleeve. 5. Wrap the blood pressure cuff around the upper part of your left arm. The cuff should be 1 inch (2.5 cm) above your elbow. It is best to wrap the cuff around bare skin. 6. Fit the cuff snugly around your arm. You should be able to place only one finger between the cuff and your arm. 7. Put the cord inside the groove of your elbow. 8. Press the power button. 9. Sit quietly while the cuff fills with air and loses air. 10. Write down the numbers on the screen. 11. Wait 2-3 minutes and then repeat steps 1-10.  What do the numbers mean? Two numbers make up your blood pressure. The first number is called systolic pressure. The second is called diastolic pressure. An example of a blood pressure reading is "120 over 80" (or 120/80). If you are an adult and do not have a medical condition, use this guide to find out if your blood pressure is normal: Normal  First number: below 120.  Second  number: below 80. Elevated  First number: 120-129.  Second number: below 80. Hypertension stage 1  First number: 130-139.  Second number: 80-89. Hypertension stage 2  First number: 140 or above.  Second number: 46 or above. Your blood pressure is above normal even if only the top or bottom number is above normal. Follow these instructions at home:  Check your blood pressure as often as your doctor tells you to.  Take your monitor to your next doctor's appointment. Your doctor will: ? Make sure you are using it correctly. ? Make sure it is working right.  Make sure you understand what your blood pressure numbers should be.  Tell your doctor if your medicines are causing side effects. Contact a doctor if:  Your blood pressure keeps being high. Get help right away if:  Your first blood pressure number is higher than 180.  Your second blood pressure number is higher than 120. This information is not intended to replace advice given to you by your health care provider. Make sure you  discuss any questions you have with your health care provider. Document Released: 11/17/2008 Document Revised: 11/02/2016 Document Reviewed: 05/13/2016 Elsevier Interactive Patient Education  2018 [place] Eating Plan DASH stands for "Dietary Approaches to Stop Hypertension." The DASH eating plan is a healthy eating plan that has been shown to reduce high blood pressure (hypertension). It may also reduce your risk for type 2 diabetes, heart disease, and stroke. The DASH eating plan may also help with weight loss. What are tips for following this plan? General guidelines  Avoid eating more than 2,300 mg (milligrams) of salt (sodium) a day. If you have hypertension, you may need to reduce your sodium intake to 1,500 mg a day.  Limit alcohol intake to no more than 1 drink a day for nonpregnant women and 2 drinks a day for men. One drink equals 12 oz of beer, 5 oz of wine, or 1 oz of hard liquor.  Work with your health care provider to maintain a healthy body weight or to lose weight. Ask what an ideal weight is for you.  Get at least 30 minutes of exercise that causes your heart to beat faster (aerobic exercise) most days of the week. Activities may include walking, swimming, or biking.  Work with your health care provider or diet and nutrition specialist (dietitian) to adjust your eating plan to your individual calorie needs. Reading food labels  Check food labels for the amount of sodium per serving. Choose foods with less than 5 percent of the Daily Value of sodium. Generally, foods with less than 300 mg of sodium per serving fit into this eating plan.  To find whole grains, look for the word "whole" as the first word in the ingredient list. Shopping  Buy products labeled as "low-sodium" or "no salt added."  Buy fresh foods. Avoid canned foods and premade or frozen meals. Cooking  Avoid adding salt when cooking. Use salt-free seasonings or herbs instead of table salt or sea  salt. Check with your health care provider or pharmacist before using salt substitutes.  Do not fry foods. Cook foods using healthy methods such as baking, boiling, grilling, and broiling instead.  Cook with heart-healthy oils, such as olive, canola, soybean, or sunflower oil. Meal planning   Eat a balanced diet that includes: ? 5 or more servings of fruits and vegetables each day. At each meal, try to fill half of your plate with fruits and vegetables. ?  Up to 6-8 servings of whole grains each day. ? Less than 6 oz of lean meat, poultry, or fish each day. A 3-oz serving of meat is about the same size as a deck of cards. One egg equals 1 oz. ? 2 servings of low-fat dairy each day. ? A serving of nuts, seeds, or beans 5 times each week. ? Heart-healthy fats. Healthy fats called Omega-3 fatty acids are found in foods such as flaxseeds and coldwater fish, like sardines, salmon, and mackerel.  Limit how much you eat of the following: ? Canned or prepackaged foods. ? Food that is high in trans fat, such as fried foods. ? Food that is high in saturated fat, such as fatty meat. ? Sweets, desserts, sugary drinks, and other foods with added sugar. ? Full-fat dairy products.  Do not salt foods before eating.  Try to eat at least 2 vegetarian meals each week.  Eat more home-cooked food and less restaurant, buffet, and fast food.  When eating at a restaurant, ask that your food be prepared with less salt or no salt, if possible. What foods are recommended? The items listed may not be a complete list. Talk with your dietitian about what dietary choices are best for you. Grains Whole-grain or whole-wheat bread. Whole-grain or whole-wheat pasta. Brown rice. [place]. Bulgur. Whole-grain and low-sodium cereals. Pita bread. Low-fat, low-sodium crackers. Whole-wheat flour tortillas. Vegetables Fresh or frozen vegetables (raw, steamed, roasted, or grilled). Low-sodium or reduced-sodium tomato  and vegetable juice. Low-sodium or reduced-sodium tomato sauce and tomato paste. Low-sodium or reduced-sodium canned vegetables. Fruits All fresh, dried, or frozen fruit. Canned fruit in natural juice (without added sugar). Meat and other protein foods Skinless chicken or [place]. Ground chicken or [place]. Pork with fat trimmed off. Fish and seafood. Egg whites. Dried beans, peas, or lentils. Unsalted nuts, nut butters, and seeds. Unsalted canned beans. Lean cuts of beef with fat trimmed off. Low-sodium, lean deli meat. Dairy Low-fat (1%) or fat-free (skim) milk. Fat-free, low-fat, or reduced-fat cheeses. Nonfat, low-sodium ricotta or cottage cheese. Low-fat or nonfat yogurt. Low-fat, low-sodium cheese. Fats and oils Soft margarine without trans fats. Vegetable oil. Low-fat, reduced-fat, or light mayonnaise and salad dressings (reduced-sodium). Canola, safflower, olive, soybean, and sunflower oils. Avocado. Seasoning and other foods Herbs. Spices. Seasoning mixes without salt. Unsalted popcorn and pretzels. Fat-free sweets. What foods are not recommended? The items listed may not be a complete list. Talk with your dietitian about what dietary choices are best for you. Grains Baked goods made with fat, such as croissants, muffins, or some breads. Dry pasta or rice meal packs. Vegetables Creamed or fried vegetables. Vegetables in a cheese sauce. Regular canned vegetables (not low-sodium or reduced-sodium). Regular canned tomato sauce and paste (not low-sodium or reduced-sodium). Regular tomato and vegetable juice (not low-sodium or reduced-sodium). Angie Fava. Olives. Fruits Canned fruit in a light or heavy syrup. Fried fruit. Fruit in cream or butter sauce. Meat and other protein foods Fatty cuts of meat. Ribs. Fried meat. Berniece Salines. Sausage. Bologna and other processed lunch meats. Salami. Fatback. Hotdogs. Bratwurst. Salted nuts and seeds. Canned beans with added salt. Canned or smoked fish. Whole eggs  or egg yolks. Chicken or [place] with skin. Dairy Whole or 2% milk, cream, and half-and-half. Whole or full-fat cream cheese. Whole-fat or sweetened yogurt. Full-fat cheese. Nondairy creamers. Whipped toppings. Processed cheese and cheese spreads. Fats and oils Butter. Stick margarine. Lard. Shortening. Ghee. Bacon fat. Tropical oils, such as coconut, palm kernel, or palm oil.  Seasoning and other foods Salted popcorn and pretzels. Onion salt, garlic salt, seasoned salt, table salt, and sea salt. Worcestershire sauce. Tartar sauce. Barbecue sauce. Teriyaki sauce. Soy sauce, including reduced-sodium. Steak sauce. Canned and packaged gravies. Fish sauce. Oyster sauce. Cocktail sauce. Horseradish that you find on the shelf. Ketchup. Mustard. Meat flavorings and tenderizers. Bouillon cubes. Hot sauce and Tabasco sauce. Premade or packaged marinades. Premade or packaged taco seasonings. Relishes. Regular salad dressings. Where to find more information:  National Heart, Lung, and [place]: https://wilson-eaton.com/  American Heart Association: www.heart.org Summary  The DASH eating plan is a healthy eating plan that has been shown to reduce high blood pressure (hypertension). It may also reduce your risk for type 2 diabetes, heart disease, and stroke.  With the DASH eating plan, you should limit salt (sodium) intake to 2,300 mg a day. If you have hypertension, you may need to reduce your sodium intake to 1,500 mg a day.  When on the DASH eating plan, aim to eat more fresh fruits and vegetables, whole grains, lean proteins, low-fat dairy, and heart-healthy fats.  Work with your health care provider or diet and nutrition specialist (dietitian) to adjust your eating plan to your individual calorie needs. This information is not intended to replace advice given to you by your health care provider. Make sure you discuss any questions you have with your health care provider. Document Released: 11/24/2011  Document Revised: 11/28/2016 Document Reviewed: 11/28/2016 Elsevier Interactive Patient Education  2017 [employer].

## 2017-06-23 LAB — COMPREHENSIVE METABOLIC PANEL
ALT: 24 IU/L (ref 0–44)
AST: 24 IU/L (ref 0–40)
Albumin/Globulin Ratio: 1.7 (ref 1.2–2.2)
Albumin: 4.3 g/dL (ref 3.5–5.5)
Alkaline Phosphatase: 65 IU/L (ref 39–117)
BUN/Creatinine Ratio: 12 (ref 9–20)
BUN: 11 mg/dL (ref 6–24)
Bilirubin Total: 0.4 mg/dL (ref 0.0–1.2)
CO2: 22 mmol/L (ref 20–29)
Calcium: 9.7 mg/dL (ref 8.7–10.2)
Chloride: 105 mmol/L (ref 96–106)
Creatinine, Ser: 0.93 mg/dL (ref 0.76–1.27)
GFR calc Af Amer: 109 mL/min/{1.73_m2} (ref >59)
GFR calc non Af Amer: 95 mL/min/{1.73_m2} (ref >59)
Globulin, Total: 2.6 g/dL (ref 1.5–4.5)
Glucose: 88 mg/dL (ref 65–99)
Potassium: 4.5 mmol/L (ref 3.5–5.2)
Sodium: 142 mmol/L (ref 134–144)
Total Protein: 6.9 g/dL (ref 6.0–8.5)

## 2017-06-23 LAB — CBC WITH DIFFERENTIAL/PLATELET
Basophils Absolute: 0 10*3/uL (ref 0.0–0.2)
Basos: 0 %
EOS (ABSOLUTE): 0.1 10*3/uL (ref 0.0–0.4)
Eos: 1 %
Hematocrit: 45.2 % (ref 37.5–51.0)
Hemoglobin: 14.3 g/dL (ref 13.0–17.7)
Immature Grans (Abs): 0 10*3/uL (ref 0.0–0.1)
Immature Granulocytes: 0 %
Lymphocytes Absolute: 2.7 10*3/uL (ref 0.7–3.1)
Lymphs: 37 %
MCH: 32.2 pg (ref 26.6–33.0)
MCHC: 31.6 g/dL (ref 31.5–35.7)
MCV: 102 fL — ABNORMAL HIGH (ref 79–97)
Monocytes Absolute: 0.5 10*3/uL (ref 0.1–0.9)
Monocytes: 7 %
Neutrophils Absolute: 4.1 10*3/uL (ref 1.4–7.0)
Neutrophils: 55 %
Platelets: 341 10*3/uL (ref 150–379)
RBC: 4.44 x10E6/uL (ref 4.14–5.80)
RDW: 12.7 % (ref 12.3–15.4)
WBC: 7.4 10*3/uL (ref 3.4–10.8)

## 2017-06-23 LAB — HEMOGLOBIN A1C
Est. average glucose Bld gHb Est-mCnc: 94 mg/dL
Hgb A1c MFr Bld: 4.9 % (ref 4.8–5.6)

## 2017-06-23 LAB — LIPID PANEL
Chol/HDL Ratio: 4 ratio (ref 0.0–5.0)
Cholesterol, Total: 180 mg/dL (ref 100–199)
HDL: 45 mg/dL (ref >39)
LDL Calculated: 116 mg/dL — ABNORMAL HIGH (ref 0–99)
Triglycerides: 97 mg/dL (ref 0–149)
VLDL Cholesterol Cal: 19 mg/dL (ref 5–40)

## 2017-06-27 ENCOUNTER — Encounter: Payer: Self-pay | Admitting: Emergency Medicine

## 2017-06-27 ENCOUNTER — Ambulatory Visit (INDEPENDENT_AMBULATORY_CARE_PROVIDER_SITE_OTHER): Payer: Managed Care, Other (non HMO) | Admitting: Emergency Medicine

## 2017-06-27 ENCOUNTER — Ambulatory Visit (INDEPENDENT_AMBULATORY_CARE_PROVIDER_SITE_OTHER): Payer: Managed Care, Other (non HMO)

## 2017-06-27 VITALS — BP 148/72 | HR 84 | Temp 98.6°F | Resp 16 | Ht 75.0 in | Wt 335.2 lb

## 2017-06-27 DIAGNOSIS — M13162 Monoarthritis, not elsewhere classified, left knee: Secondary | ICD-10-CM

## 2017-06-27 DIAGNOSIS — M25462 Effusion, left knee: Secondary | ICD-10-CM | POA: Diagnosis not present

## 2017-06-27 DIAGNOSIS — M25562 Pain in left knee: Secondary | ICD-10-CM

## 2017-06-27 MED ORDER — DICLOFENAC SODIUM 75 MG PO TBEC: 75.0000 mg | DELAYED_RELEASE_TABLET | Freq: Two times a day (BID) | ORAL | 0 refills | Status: AC

## 2017-06-27 MED ORDER — METHYLPREDNISOLONE 4 MG PO TBPK: ORAL_TABLET | ORAL | 1 refills | Status: DC

## 2017-06-27 NOTE — Progress Notes (Signed)
Bobby Mcguire 51 y.o.   Chief Complaint  Patient presents with  . Follow-up    LEFT KNEE per patient not better    HISTORY OF PRESENT ILLNESS: This is a 51 y.o. male complaining of persistent pain and swelling to left knee; seen by me 06/22/17 and started on Prednisone; pt has bad right knee, told he needs replacement, he places more pressure on the left knee and thinks he maybe sprained it.  HPI   Prior to Admission medications   Medication Sig Start Date End Date Taking? Authorizing Provider  amLODipine (NORVASC) 5 MG tablet Take 1 tablet (5 mg total) by mouth daily. 06/22/17  Yes Ameira Alessandrini, Ines Bloomer, MD  Multiple Vitamin (MULTIVITAMIN WITH MINERALS) TABS tablet Take 1 tablet by mouth daily.   Yes [provider]  VIAGRA 100 MG tablet TAKE ONE-HALF (1/2) TO ONE TABLET DAILY AS NEEDED FOR ERECTILE DYSFUNCTION 05/19/17  Yes Tenna Delaine D, PA-C  diclofenac (VOLTAREN) 75 MG EC tablet Take 1 tablet (75 mg total) by mouth 2 (two) times daily. 06/27/17 07/02/17  [name], MD  magnesium oxide (MAG-OX) 400 MG tablet Take 400 mg by mouth daily as needed (for excessive sweating to prevent cramping.).    [provider]  meloxicam (MOBIC) 15 MG tablet Take 15 mg by mouth once. 06/06/17 06/06/18  [provider]  methylPREDNISolone (MEDROL DOSEPAK) 4 MG TBPK tablet Sig as indicated 06/27/17   [name], MD  Multiple Vitamins-Minerals (AIRBORNE PO) Take 1 packet by mouth as needed (to boost immune health.).     [provider]  naproxen (NAPROSYN) 500 MG tablet Take 1 tablet (500 mg total) by mouth 2 (two) times daily with a meal. Patient not taking: Reported on 06/22/2017 02/10/17   Leonie Douglas, PA-C  predniSONE (DELTASONE) 20 MG tablet Take 2 tablets (40 mg total) by mouth daily with breakfast. Patient not taking: Reported on 06/27/2017 06/22/17 06/27/17  [name], MD    No Known Allergies  Patient Active Problem List     Diagnosis Date Noted  . Inflammation of joint of left knee 06/22/2017  . Essential hypertension 06/22/2017  . Obesity 08/18/2016  . Obstructive sleep apnea 05/07/2010    Past Medical History:  Diagnosis Date  . Sleep apnea     No past surgical history on file.  Social History   Social History  . Marital status: Single    Spouse name: N/A  . Number of children: N/A  . Years of education: N/A   Occupational History  . Not on file.   Social History Main Topics  . Smoking status: Never Smoker  . Smokeless tobacco: Never Used  . Alcohol use 1.8 oz/week    3 Glasses of wine per week  . Drug use: No  . Sexual activity: Not on file   Other Topics Concern  . Not on file   Social History Narrative  . No narrative on file    Family History  Problem Relation Age of Onset  . Cancer Mother      Review of Systems  Constitutional: Negative.  Negative for chills and fever.  Gastrointestinal: Negative for nausea and vomiting.  Musculoskeletal: Positive for joint pain (left knee).  Skin: Negative for rash.  Neurological: Negative for sensory change and focal weakness.  Endo/Heme/Allergies: Negative.   All other systems reviewed and are negative.    Vitals:   06/27/17 1657 06/27/17 1744  BP: (!) 152/77 (!) 148/72  Pulse:  84   Resp: 16   Temp: 98.6 F (37 C)      Physical Exam  Constitutional: He is oriented to person, place, and time. He appears well-developed and well-nourished.  HENT:  Head: Normocephalic.  Eyes: EOM are normal. Pupils are equal, round, and reactive to light.  Neck: Normal range of motion. Neck supple.  Cardiovascular: Normal rate.   Pulmonary/Chest: Effort normal.  Musculoskeletal:  Left knee: mild swelling, no erythema, no significant tenderness; FROM  Neurological: He is alert and oriented to person, place, and time. He exhibits normal muscle tone.  Skin: Skin is warm and dry. Capillary refill takes less than 2 seconds.  Psychiatric:  He has a normal mood and affect. His behavior is normal.  Vitals reviewed.  Dg Knee Complete 4 Views Left  Result Date: 06/27/2017 CLINICAL DATA:  Acute LEFT knee pain. EXAM: LEFT KNEE - COMPLETE 4+ VIEW COMPARISON:  RIGHT knee radiograph February 10, 2017 FINDINGS: No acute fracture for dislocation. Moderate patellofemoral compartment narrowing with undersurface spurring. Mild medial compartment narrowing with marginal spurring. No destructive bony lesions. Moderate suprapatellar joint effusion. IMPRESSION: Moderate suprapatellar joint effusion without acute osseous process. Moderate patellofemoral and mild medial compartment osteoarthrosis. Electronically Signed   By: Elon Alas M.D.   On: 06/27/2017 17:35    ASSESSMENT & PLAN: Bryley was seen today for follow-up.  Diagnoses and all orders for this visit:  Inflammation of joint of left knee -     DG Knee Complete 4 Views Left; Future -     Ambulatory referral to Orthopedic Surgery  Acute pain of left knee -     DG Knee Complete 4 Views Left; Future -     Ambulatory referral to Orthopedic Surgery  Effusion of knee joint, left  Other orders -     methylPREDNISolone (MEDROL DOSEPAK) 4 MG TBPK tablet; Sig as indicated -     diclofenac (VOLTAREN) 75 MG EC tablet; Take 1 tablet (75 mg total) by mouth 2 (two) times daily.    Patient Instructions   pc    IF you received an x-ray today, you will receive an invoice from [facility]. Please contact [facility] at [phone] with questions or concerns regarding your invoice.   IF you received labwork today, you will receive an invoice from [facility]. Please contact [facility] at [phone] with questions or concerns regarding your invoice.   Our billing staff will not be able to assist you with questions regarding bills from these companies.  You will be contacted with the lab results as soon as they are available. The fastest way to get your results is to  activate your My Chart account. Instructions are located on the last page of this paperwork. If you have not heard from [place] regarding the results in 2 weeks, please contact this office.     Knee Pain, Adult Many things can cause knee pain. The pain often goes away on its own with time and rest. If the pain does not go away, tests may be done to find out what is causing the pain. Follow these instructions at home: Activity  Rest your knee.  Do not do things that cause pain.  Avoid activities where both feet leave the ground at the same time (high-impact activities). Examples are running, jumping rope, and doing jumping jacks. General instructions  Take medicines only as told by your doctor.  Raise (elevate) your knee when you are resting. Make sure your knee is higher than your heart.  Sleep with a pillow under your knee.  If told, put ice on the knee: ? Put ice in a plastic bag. ? Place a towel between your skin and the bag. ? Leave the ice on for 20 minutes, 2-3 times a day.  Ask your doctor if you should wear an elastic knee support.  Lose weight if you are overweight. Being overweight can make your knee hurt more.  Do not use any tobacco products. These include cigarettes, chewing tobacco, or electronic cigarettes. If you need help quitting, ask your doctor. Smoking may slow down healing. Contact a doctor if:  The pain does not stop.  The pain changes or gets worse.  You have a fever along with knee pain.  Your knee gives out or locks up.  Your knee swells, and becomes worse. Get help right away if:  Your knee feels warm.  You cannot move your knee.  You have very bad knee pain.  You have chest pain.  You have trouble breathing. Summary  Many things can cause knee pain. The pain often goes away on its own with time and rest.  Avoid activities that put stress on your knee. These include running and jumping rope.  Get help right away if you cannot move your  knee, or if your knee feels warm, or if you have trouble breathing. This information is not intended to replace advice given to you by your health care provider. Make sure you discuss any questions you have with your health care provider. Document Released: 03/03/2009 Document Revised: 11/29/2016 Document Reviewed: 11/29/2016 Elsevier Interactive Patient Education  2017 [employer]      Agustina Caroli, MD [facility]

## 2017-06-27 NOTE — Patient Instructions (Addendum)
pc    IF you received an x-ray today, you will receive an invoice from [facility]. Please contact [facility] at [phone] with questions or concerns regarding your invoice.   IF you received labwork today, you will receive an invoice from [facility]. Please contact [facility] at [phone] with questions or concerns regarding your invoice.   Our billing staff will not be able to assist you with questions regarding bills from these companies.  You will be contacted with the lab results as soon as they are available. The fastest way to get your results is to activate your My Chart account. Instructions are located on the last page of this paperwork. If you have not heard from [place] regarding the results in 2 weeks, please contact this office.     Knee Pain, Adult Many things can cause knee pain. The pain often goes away on its own with time and rest. If the pain does not go away, tests may be done to find out what is causing the pain. Follow these instructions at home: Activity  Rest your knee.  Do not do things that cause pain.  Avoid activities where both feet leave the ground at the same time (high-impact activities). Examples are running, jumping rope, and doing jumping jacks. General instructions  Take medicines only as told by your doctor.  Raise (elevate) your knee when you are resting. Make sure your knee is higher than your heart.  Sleep with a pillow under your knee.  If told, put ice on the knee: ? Put ice in a plastic bag. ? Place a towel between your skin and the bag. ? Leave the ice on for 20 minutes, 2-3 times a day.  Ask your doctor if you should wear an elastic knee support.  Lose weight if you are overweight. Being overweight can make your knee hurt more.  Do not use any tobacco products. These include cigarettes, chewing tobacco, or electronic cigarettes. If you need help quitting, ask your doctor. Smoking may slow down healing. Contact a  doctor if:  The pain does not stop.  The pain changes or gets worse.  You have a fever along with knee pain.  Your knee gives out or locks up.  Your knee swells, and becomes worse. Get help right away if:  Your knee feels warm.  You cannot move your knee.  You have very bad knee pain.  You have chest pain.  You have trouble breathing. Summary  Many things can cause knee pain. The pain often goes away on its own with time and rest.  Avoid activities that put stress on your knee. These include running and jumping rope.  Get help right away if you cannot move your knee, or if your knee feels warm, or if you have trouble breathing. This information is not intended to replace advice given to you by your health care provider. Make sure you discuss any questions you have with your health care provider. Document Released: 03/03/2009 Document Revised: 11/29/2016 Document Reviewed: 11/29/2016 Elsevier Interactive Patient Education  2017 [employer].

## 2017-07-24 ENCOUNTER — Other Ambulatory Visit: Payer: Self-pay | Admitting: *Deleted

## 2017-07-24 ENCOUNTER — Encounter: Payer: Self-pay | Admitting: Emergency Medicine

## 2017-07-31 ENCOUNTER — Ambulatory Visit (INDEPENDENT_AMBULATORY_CARE_PROVIDER_SITE_OTHER): Payer: Managed Care, Other (non HMO) | Admitting: Orthopaedic Surgery

## 2017-07-31 ENCOUNTER — Encounter (INDEPENDENT_AMBULATORY_CARE_PROVIDER_SITE_OTHER): Payer: Self-pay | Admitting: Orthopaedic Surgery

## 2017-07-31 VITALS — BP 142/65 | HR 90 | Resp 14 | Ht 75.0 in | Wt 330.0 lb

## 2017-07-31 DIAGNOSIS — G8929 Other chronic pain: Secondary | ICD-10-CM | POA: Diagnosis not present

## 2017-07-31 DIAGNOSIS — M25562 Pain in left knee: Secondary | ICD-10-CM | POA: Diagnosis not present

## 2017-07-31 MED ORDER — BUPIVACAINE HCL 0.5 % IJ SOLN
3.0000 mL | INTRAMUSCULAR | Status: AC | PRN
  Administered 2017-07-31: 3 mL via INTRA_ARTICULAR

## 2017-07-31 MED ORDER — METHYLPREDNISOLONE ACETATE 40 MG/ML IJ SUSP
80.0000 mg | INTRAMUSCULAR | Status: AC | PRN
  Administered 2017-07-31: 80 mg

## 2017-07-31 MED ORDER — LIDOCAINE HCL 1 % IJ SOLN
5.0000 mL | INTRAMUSCULAR | Status: AC | PRN
  Administered 2017-07-31: 5 mL

## 2017-07-31 NOTE — Progress Notes (Signed)
Office Visit Note   Patient: Bobby Mcguire           Date of Birth: Sep 08, 1966           MRN: [id number] Visit Date: 07/31/2017              Requested by: Bobby Pollen, MD [place], [place] [address] PCP: Patient, No Pcp Per   Assessment & Plan: Visit Diagnoses:  1. Chronic pain of left knee   Probable osteoarthritis left knee. Films performed outside the clinic demonstrate minimal osteoarthritic changes but were performed lying down.  Plan: I aspirated left knee of 107 mL of slightly cloudy yellow fluid. We'll send for analysis. Injected the knee with cortisone. See back as needed assuming the aspirate is benign  Follow-Up Instructions: Return if symptoms worsen or fail to improve.   Orders:  Orders Placed This Encounter  Procedures  . Body fluid culture  . Cell count + diff,  w/ cryst-synvl fld   No orders of the defined types were placed in this encounter.     Procedures: Large Joint Inj Date/Time: 07/31/2017 4:25 PM Performed by: Bobby Mcguire Authorized by: Bobby Mcguire   Consent Given by:  Patient Timeout: prior to procedure the correct patient, procedure, and site was verified   Indications:  Pain and joint swelling Location:  Knee Site:  L knee Prep: patient was prepped and draped in usual sterile fashion   Needle Size:  25 G Needle Length:  1.5 inches Approach:  Anteromedial Ultrasound Guidance: No   Fluoroscopic Guidance: No   Arthrogram: No   Medications:  5 mL lidocaine 1 %; 80 mg methylPREDNISolone acetate 40 MG/ML; 3 mL bupivacaine 0.5 % Aspiration Attempted: Yes   Aspirate amount (mL):  107 Aspirate:  Yellow and cloudy Lab: fluid sent for laboratory analysis   Patient tolerance:  Patient tolerated the procedure well with no immediate complications     Clinical Data: No additional findings.   Subjective: Chief Complaint  Patient presents with  . Left Knee - Pain, Numbness    Bobby Mcguire is a 73 y o that is  a 13 y o that presents with Left knee pain. X 2 months. He relates his knee is swollen, the "muscle has moved" and a possible Baker's cyst.  Has had significant swelling of his left knee to the point of compromise. He denies any injury or trauma. He's had a similar problem on his right knee requiring aspiration injection in the past. He is aware that he might have "arthritis". But has been busy and unable to seek medical attention. Swelling is reached a point where it interferes with his activities. Denies any fever or chills or other joint complaints other than his right knee as mentioned he is on his feet a good part of the day. No sensation of his knee giving way. No locking  HPI  Review of Systems   Objective: Vital Signs: BP (!) 142/65   Pulse 90   Resp 14   Ht 6\' 3"  (1.905 m)   Wt (!) 330 lb (149.7 kg)   BMI 41.25 kg/m   Physical Exam  Ortho Exam large effusion left knee. Mostly medial joint pain which is actually moderate. No instability. Lacks some degree of full extension based on the effusion. No popliteal pain. Mild swelling of both ankles. Sensibility intact. +1 pulses. Straight leg raise negative. Painless range of motion both hips. No specialty comments available.  Imaging: No  results found.   PMFS History: Patient Active Problem List   Diagnosis Date Noted  . Acute pain of left knee 06/27/2017  . Effusion of knee joint, left 06/27/2017  . Inflammation of joint of left knee 06/22/2017  . Essential hypertension 06/22/2017  . Obesity 08/18/2016  . Obstructive sleep apnea 05/07/2010   Past Medical History:  Diagnosis Date  . Sleep apnea     Family History  Problem Relation Age of Onset  . Cancer Mother     History reviewed. No pertinent surgical history. Social History   Occupational History  . Not on file.   Social History Main Topics  . Smoking status: Never Smoker  . Smokeless tobacco: Never Used  . Alcohol use 1.8 oz/week    3 Glasses of wine per  week  . Drug use: No  . Sexual activity: Not on file

## 2017-08-01 LAB — SYNOVIAL CELL COUNT + DIFF, W/ CRYSTALS
Basophils, %: 0 %
Eosinophils-Synovial: 0 % (ref 0–2)
Lymphocytes-Synovial Fld: 8 % (ref 0–74)
Monocyte/Macrophage: 82 % — ABNORMAL HIGH (ref 0–69)
Neutrophil, Synovial: 10 % (ref 0–24)
Synoviocytes, %: 0 % (ref 0–15)
WBC, Synovial: 269 cells/uL — ABNORMAL HIGH (ref <150)

## 2017-08-05 LAB — BODY FLUID CULTURE
Gram Stain: NONE SEEN
Organism ID, Bacteria: NO GROWTH

## 2017-09-15 ENCOUNTER — Other Ambulatory Visit: Payer: Self-pay | Admitting: Physician Assistant

## 2017-09-15 DIAGNOSIS — N529 Male erectile dysfunction, unspecified: Secondary | ICD-10-CM

## 2017-09-19 ENCOUNTER — Ambulatory Visit (INDEPENDENT_AMBULATORY_CARE_PROVIDER_SITE_OTHER): Payer: Managed Care, Other (non HMO) | Admitting: Emergency Medicine

## 2017-09-19 ENCOUNTER — Encounter: Payer: Self-pay | Admitting: Emergency Medicine

## 2017-09-19 VITALS — BP 138/62 | HR 95 | Temp 99.1°F | Resp 16 | Ht 75.0 in | Wt 329.0 lb

## 2017-09-19 DIAGNOSIS — I10 Essential (primary) hypertension: Secondary | ICD-10-CM

## 2017-09-19 DIAGNOSIS — R0981 Nasal congestion: Secondary | ICD-10-CM

## 2017-09-19 DIAGNOSIS — J019 Acute sinusitis, unspecified: Secondary | ICD-10-CM | POA: Diagnosis not present

## 2017-09-19 DIAGNOSIS — N529 Male erectile dysfunction, unspecified: Secondary | ICD-10-CM | POA: Diagnosis not present

## 2017-09-19 MED ORDER — AMOXICILLIN-POT CLAVULANATE 875-125 MG PO TABS: 1.0000 | ORAL_TABLET | Freq: Two times a day (BID) | ORAL | 0 refills | Status: AC

## 2017-09-19 MED ORDER — SILDENAFIL CITRATE 100 MG PO TABS: ORAL_TABLET | ORAL | 6 refills | Status: DC

## 2017-09-19 MED ORDER — TRIAMCINOLONE ACETONIDE 55 MCG/ACT NA AERO: 2.0000 | INHALATION_SPRAY | Freq: Every day | NASAL | 12 refills | Status: DC

## 2017-09-19 MED ORDER — AMLODIPINE BESYLATE 5 MG PO TABS: 5.0000 mg | ORAL_TABLET | Freq: Every day | ORAL | 3 refills | Status: DC

## 2017-09-19 NOTE — Patient Instructions (Addendum)
     IF you received an x-ray today, you will receive an invoice from [facility]. Please contact [facility] at [phone] with questions or concerns regarding your invoice.   IF you received labwork today, you will receive an invoice from [facility]. Please contact [facility] at [phone] with questions or concerns regarding your invoice.   Our billing staff will not be able to assist you with questions regarding bills from these companies.  You will be contacted with the lab results as soon as they are available. The fastest way to get your results is to activate your My Chart account. Instructions are located on the last page of this paperwork. If you have not heard from us regarding the results in 2 weeks, please contact this office.     Sinusitis, Adult Sinusitis is soreness and inflammation of your sinuses. Sinuses are hollow spaces in the bones around your face. They are located:  Around your eyes.  In the middle of your forehead.  Behind your nose.  In your cheekbones.  Your sinuses and nasal passages are lined with a stringy fluid (mucus). Mucus normally drains out of your sinuses. When your nasal tissues get inflamed or swollen, the mucus can get trapped or blocked so air cannot flow through your sinuses. This lets bacteria, viruses, and funguses grow, and that leads to infection. Follow these instructions at home: Medicines  Take, use, or apply over-the-counter and prescription medicines only as told by your doctor. These may include nasal sprays.  If you were prescribed an antibiotic medicine, take it as told by your doctor. Do not stop taking the antibiotic even if you start to feel better. Hydrate and Humidify  Drink enough water to keep your pee (urine) clear or pale yellow.  Use a cool mist humidifier to keep the humidity level in your home above 50%.  Breathe in steam for 10-15 minutes, 3-4 times a day or as told by your doctor. You can do  this in the bathroom while a hot shower is running.  Try not to spend time in cool or dry air. Rest  Rest as much as possible.  Sleep with your head raised (elevated).  Make sure to get enough sleep each night. General instructions  Put a warm, moist washcloth on your face 3-4 times a day or as told by your doctor. This will help with discomfort.  Wash your hands often with soap and water. If there is no soap and water, use hand sanitizer.  Do not smoke. Avoid being around people who are smoking (secondhand smoke).  Keep all follow-up visits as told by your doctor. This is important. Contact a doctor if:  You have a fever.  Your symptoms get worse.  Your symptoms do not get better within 10 days. Get help right away if:  You have a very bad headache.  You cannot stop throwing up (vomiting).  You have pain or swelling around your face or eyes.  You have trouble seeing.  You feel confused.  Your neck is stiff.  You have trouble breathing. This information is not intended to replace advice given to you by your health care provider. Make sure you discuss any questions you have with your health care provider. Document Released: 05/23/2008 Document Revised: 07/31/2016 Document Reviewed: 09/30/2015 Elsevier Interactive Patient Education  2018 Elsevier Inc.  

## 2017-09-19 NOTE — Progress Notes (Signed)
Bobby Mcguire 51 y.o.   Chief Complaint  Patient presents with  . Nasal Congestion    x 2 days    HISTORY OF PRESENT ILLNESS: This is a 51 y.o. male complaining of sinus congestion x 2 days with purulent secretions and stuffiness making it difficult to use CPAP machine at home.  Sinus Problem  This is a new problem. The current episode started in the past 7 days. The problem has been gradually worsening since onset. There has been no fever. The pain is mild. Associated symptoms include congestion and sinus pressure. Pertinent negatives include no chills, coughing, headaches, neck pain, shortness of breath, sore throat or swollen glands. Past treatments include spray decongestants. The treatment provided mild relief.     Prior to Admission medications   Medication Sig Start Date End Date Taking? Authorizing Provider  amLODipine (NORVASC) 5 MG tablet Take 1 tablet (5 mg total) by mouth daily. 09/19/17  Yes Elizebeth Kluesner, Ines Bloomer, MD  Multiple Vitamin (MULTIVITAMIN WITH MINERALS) TABS tablet Take 1 tablet by mouth daily.   Yes [provider]  sildenafil (VIAGRA) 100 MG tablet TAKE ONE-HALF (1/2) TO ONE TABLET DAILY AS NEEDED FOR ERECTILE DYSFUNCTION 09/19/17  Yes Amarria Andreasen, Ines Bloomer, MD  amoxicillin-clavulanate (AUGMENTIN) 875-125 MG tablet Take 1 tablet by mouth 2 (two) times daily. 09/19/17 09/26/17  [name], MD  magnesium oxide (MAG-OX) 400 MG tablet Take 400 mg by mouth daily as needed (for excessive sweating to prevent cramping.).    [provider]  meloxicam (MOBIC) 15 MG tablet Take 15 mg by mouth once. 06/06/17 06/06/18  [provider]  methylPREDNISolone (MEDROL DOSEPAK) 4 MG TBPK tablet Sig as indicated Patient not taking: Reported on 07/31/2017 06/27/17   [name], MD  Multiple Vitamins-Minerals (AIRBORNE PO) Take 1 packet by mouth as needed (to boost immune health.).     [provider]  naproxen (NAPROSYN) 500 MG  tablet Take 1 tablet (500 mg total) by mouth 2 (two) times daily with a meal. Patient not taking: Reported on 06/22/2017 02/10/17   Tenna Delaine D, PA-C  triamcinolone (NASACORT) 55 MCG/ACT AERO nasal inhaler Place 2 sprays into the nose daily. 09/19/17   [name], MD    No Known Allergies  Patient Active Problem List   Diagnosis Date Noted  . Acute pain of left knee 06/27/2017  . Effusion of knee joint, left 06/27/2017  . Inflammation of joint of left knee 06/22/2017  . Essential hypertension 06/22/2017  . Obesity 08/18/2016  . Obstructive sleep apnea 05/07/2010    Past Medical History:  Diagnosis Date  . Sleep apnea     No past surgical history on file.  Social History   Social History  . Marital status: Single    Spouse name: N/A  . Number of children: N/A  . Years of education: N/A   Occupational History  . Not on file.   Social History Main Topics  . Smoking status: Never Smoker  . Smokeless tobacco: Never Used  . Alcohol use 1.8 oz/week    3 Glasses of wine per week  . Drug use: No  . Sexual activity: Not on file   Other Topics Concern  . Not on file   Social History Narrative  . No narrative on file    Family History  Problem Relation Age of Onset  . Cancer Mother      Review of Systems  Constitutional: Negative.  Negative for chills and fever.  HENT: Positive for congestion, sinus pain and sinus pressure. Negative for ear discharge, nosebleeds and sore throat.   Eyes: Negative.  Negative for discharge and redness.  Respiratory: Negative for cough, shortness of breath and wheezing.   Cardiovascular: Negative.  Negative for chest pain and palpitations.  Gastrointestinal: Negative.  Negative for abdominal pain, diarrhea, nausea and vomiting.  Genitourinary: Negative.   Musculoskeletal: Negative.  Negative for back pain and neck pain.  Skin: Negative for rash.  Neurological: Negative for headaches.  Endo/Heme/Allergies: Negative.     All other systems reviewed and are negative.  Vitals:   09/19/17 1723  BP: (!) 156/62  Pulse: 95  Resp: 16  Temp: 99.1 F (37.3 C)  SpO2: 97%     Physical Exam  Constitutional: He is oriented to person, place, and time. He appears well-developed and well-nourished.  HENT:  Head: Normocephalic and atraumatic.  Nose: Mucosal edema and sinus tenderness present. Right sinus exhibits maxillary sinus tenderness. Left sinus exhibits maxillary sinus tenderness.  Mouth/Throat: Oropharynx is clear and moist.  Eyes: Pupils are equal, round, and reactive to light. Conjunctivae and EOM are normal.  Neck: Normal range of motion. Neck supple.  Cardiovascular: Normal rate, regular rhythm and normal heart sounds.   Pulmonary/Chest: Effort normal and breath sounds normal.  Musculoskeletal: Normal range of motion.  Neurological: He is alert and oriented to person, place, and time. No sensory deficit. He exhibits normal muscle tone.  Skin: Skin is warm and dry. Capillary refill takes less than 2 seconds. No rash noted.  Psychiatric: He has a normal mood and affect. His behavior is normal.  Vitals reviewed.    ASSESSMENT & PLAN: [name] was seen today for nasal congestion.  Diagnoses and all orders for this visit:  Acute non-recurrent sinusitis, unspecified location  Sinus congestion  Essential hypertension -     amLODipine (NORVASC) 5 MG tablet; Take 1 tablet (5 mg total) by mouth daily.  Erectile dysfunction, unspecified erectile dysfunction type -     sildenafil (VIAGRA) 100 MG tablet; TAKE ONE-HALF (1/2) TO ONE TABLET DAILY AS NEEDED FOR ERECTILE DYSFUNCTION  Other orders -     amoxicillin-clavulanate (AUGMENTIN) 875-125 MG tablet; Take 1 tablet by mouth 2 (two) times daily. -     triamcinolone (NASACORT) 55 MCG/ACT AERO nasal inhaler; Place 2 sprays into the nose daily.    Patient Instructions       IF you received an x-ray today, you will receive an invoice from [facility]. Please contact [facility] at [phone] with questions or concerns regarding your invoice.   IF you received labwork today, you will receive an invoice from [facility]. Please contact [facility] at [phone] with questions or concerns regarding your invoice.   Our billing staff will not be able to assist you with questions regarding bills from these companies.  You will be contacted with the lab results as soon as they are available. The fastest way to get your results is to activate your My Chart account. Instructions are located on the last page of this paperwork. If you have not heard from [place] regarding the results in 2 weeks, please contact this office.     Sinusitis, Adult Sinusitis is soreness and inflammation of your sinuses. Sinuses are hollow spaces in the bones around your face. They are located:  Around your eyes.  In the middle of your forehead.  Behind your nose.  In your cheekbones.  Your sinuses and nasal passages are lined with a stringy fluid (  mucus). Mucus normally drains out of your sinuses. When your nasal tissues get inflamed or swollen, the mucus can get trapped or blocked so air cannot flow through your sinuses. This lets bacteria, viruses, and funguses grow, and that leads to infection. Follow these instructions at home: Medicines  Take, use, or apply over-the-counter and prescription medicines only as told by your doctor. These may include nasal sprays.  If you were prescribed an antibiotic medicine, take it as told by your doctor. Do not stop taking the antibiotic even if you start to feel better. Hydrate and Humidify  Drink enough water to keep your pee (urine) clear or pale yellow.  Use a cool mist humidifier to keep the humidity level in your home above 50%.  Breathe in steam for 10-15 minutes, 3-4 times a day or as told by your doctor. You can do this in the bathroom while a hot shower is running.  Try not to spend time in cool or  dry air. Rest  Rest as much as possible.  Sleep with your head raised (elevated).  Make sure to get enough sleep each night. General instructions  Put a warm, moist washcloth on your face 3-4 times a day or as told by your doctor. This will help with discomfort.  Wash your hands often with soap and water. If there is no soap and water, use hand sanitizer.  Do not smoke. Avoid being around people who are smoking (secondhand smoke).  Keep all follow-up visits as told by your doctor. This is important. Contact a doctor if:  You have a fever.  Your symptoms get worse.  Your symptoms do not get better within 10 days. Get help right away if:  You have a very bad headache.  You cannot stop throwing up (vomiting).  You have pain or swelling around your face or eyes.  You have trouble seeing.  You feel confused.  Your neck is stiff.  You have trouble breathing. This information is not intended to replace advice given to you by your health care provider. Make sure you discuss any questions you have with your health care provider. Document Released: 05/23/2008 Document Revised: 07/31/2016 Document Reviewed: 09/30/2015 Elsevier Interactive Patient Education  2018 Elsevier Inc.      Agustina Caroli, MD [facility]

## 2017-09-20 ENCOUNTER — Telehealth: Payer: Self-pay | Admitting: Family Medicine

## 2017-09-20 MED ORDER — PROMETHAZINE-DM 6.25-15 MG/5ML PO SYRP: 5.0000 mL | ORAL_SOLUTION | Freq: Four times a day (QID) | ORAL | 0 refills | Status: DC | PRN

## 2017-09-20 MED ORDER — BENZONATATE 200 MG PO CAPS: 200.0000 mg | ORAL_CAPSULE | Freq: Two times a day (BID) | ORAL | 0 refills | Status: DC | PRN

## 2017-09-20 NOTE — Telephone Encounter (Signed)
Pt stated that he has developed a dry cough as of last night. Rx to [facility], [place] and [place]. [phone]

## 2017-09-20 NOTE — Telephone Encounter (Signed)
Tessalon and Phenergan DM prescriptions sent.

## 2017-09-20 NOTE — Telephone Encounter (Signed)
Pt advised.

## 2017-09-21 ENCOUNTER — Ambulatory Visit (INDEPENDENT_AMBULATORY_CARE_PROVIDER_SITE_OTHER): Payer: Managed Care, Other (non HMO) | Admitting: Family Medicine

## 2017-09-21 ENCOUNTER — Encounter: Payer: Self-pay | Admitting: Family Medicine

## 2017-09-21 VITALS — BP 138/80 | HR 89 | Temp 98.4°F | Resp 17 | Ht 75.0 in | Wt 328.8 lb

## 2017-09-21 DIAGNOSIS — R0981 Nasal congestion: Secondary | ICD-10-CM

## 2017-09-21 DIAGNOSIS — R05 Cough: Secondary | ICD-10-CM

## 2017-09-21 DIAGNOSIS — J019 Acute sinusitis, unspecified: Secondary | ICD-10-CM

## 2017-09-21 DIAGNOSIS — R059 Cough, unspecified: Secondary | ICD-10-CM

## 2017-09-21 MED ORDER — HYDROCODONE-HOMATROPINE 5-1.5 MG/5ML PO SYRP: 5.0000 mL | ORAL_SOLUTION | Freq: Three times a day (TID) | ORAL | 0 refills | Status: DC | PRN

## 2017-09-21 NOTE — Patient Instructions (Addendum)
     IF you received an x-ray today, you will receive an invoice from [facility]. Please contact [facility] at [phone] with questions or concerns regarding your invoice.   IF you received labwork today, you will receive an invoice from [facility]. Please contact [facility] at [phone] with questions or concerns regarding your invoice.   Our billing staff will not be able to assist you with questions regarding bills from these companies.  You will be contacted with the lab results as soon as they are available. The fastest way to get your results is to activate your My Chart account. Instructions are located on the last page of this paperwork. If you have not heard from [place] regarding the results in 2 weeks, please contact this office.    Sinus Rinse What is a sinus rinse? A sinus rinse is a simple home treatment that is used to rinse your sinuses with a sterile mixture of salt and water (saline solution). Sinuses are air-filled spaces in your skull behind the bones of your face and forehead that open into your nasal cavity. You will use the following:  Saline solution.  Neti pot or spray bottle. This releases the saline solution into your nose and through your sinuses. Neti pots and spray bottles can be purchased at [employer], a health food store, or online.  When would I do a sinus rinse? A sinus rinse can help to clear mucus, dirt, dust, or pollen from the nasal cavity. You may do a sinus rinse when you have a cold, a virus, nasal allergy symptoms, a sinus infection, or stuffiness in the nose or sinuses. If you are considering a sinus rinse:  Ask your child's health care provider before performing a sinus rinse on your child.  Do not do a sinus rinse if you have had ear or nasal surgery, ear infection, or blocked ears.  How do I do a sinus rinse?  Wash your hands.  Disinfect your device according to the directions provided and then dry it.  Use  the solution that comes with your device or one that is sold separately in stores. Follow the mixing directions on the package.  Fill your device with the amount of saline solution as directed by the device instructions.  Stand over a sink and tilt your head sideways over the sink.  Place the spout of the device in your upper nostril (the one closer to the ceiling).  Gently pour or squeeze the saline solution into the nasal cavity. The liquid should drain to the lower nostril if you are not overly congested.  Gently blow your nose. Blowing too hard may cause ear pain.  Repeat in the other nostril.  Clean and rinse your device with clean water and then air-dry it. Are there risks of a sinus rinse? Sinus rinse is generally very safe and effective. However, there are a few risks, which include:  A burning sensation in the sinuses. This may happen if you do not make the saline solution as directed. Make sure to follow all directions when making the saline solution.  Infection from contaminated water. This is rare, but possible.  Nasal irritation.  This information is not intended to replace advice given to you by your health care provider. Make sure you discuss any questions you have with your health care provider. Document Released: 07/02/2014 Document Revised: 11/01/2016 Document Reviewed: 04/22/2014 Elsevier Interactive Patient Education  2017 [employer].

## 2017-09-21 NOTE — Progress Notes (Signed)
Chief Complaint  Patient presents with  . Cough    Onset: Saturday, saw Sagardia and given medication w/no relief.  Pt tried mucinex, delsym, robitussin with no reliefs, took wife's leftover hydromet(hydrocodone/homa 5/1.5 mg    HPI   Pt reports that he was prescribed Augmentin and is now on day 2 of sinus treatment He continues to have the congestion  States that he cannot sleep due to the drainage Denies fevers or chills No nausea or vomiting  Past Medical History:  Diagnosis Date  . Sleep apnea     Current Outpatient Prescriptions  Medication Sig Dispense Refill  . amLODipine (NORVASC) 5 MG tablet Take 1 tablet (5 mg total) by mouth daily. 90 tablet 3  . amoxicillin-clavulanate (AUGMENTIN) 875-125 MG tablet Take 1 tablet by mouth 2 (two) times daily. 14 tablet 0  . benzonatate (TESSALON) 200 MG capsule Take 1 capsule (200 mg total) by mouth 2 (two) times daily as needed for cough. 20 capsule 0  . Multiple Vitamin (MULTIVITAMIN WITH MINERALS) TABS tablet Take 1 tablet by mouth daily.    . Multiple Vitamins-Minerals (AIRBORNE PO) Take 1 packet by mouth as needed (to boost immune health.).     Marland Kitchen promethazine-dextromethorphan (PROMETHAZINE-DM) 6.25-15 MG/5ML syrup Take 5 mLs by mouth 4 (four) times daily as needed for cough. 118 mL 0  . sildenafil (VIAGRA) 100 MG tablet TAKE ONE-HALF (1/2) TO ONE TABLET DAILY AS NEEDED FOR ERECTILE DYSFUNCTION 12 tablet 6  . triamcinolone (NASACORT) 55 MCG/ACT AERO nasal inhaler Place 2 sprays into the nose daily. 1 Inhaler 12  . HYDROcodone-homatropine (HYCODAN) 5-1.5 MG/5ML syrup Take 5 mLs by mouth every 8 (eight) hours as needed for cough. 120 mL 0  . magnesium oxide (MAG-OX) 400 MG tablet Take 400 mg by mouth daily as needed (for excessive sweating to prevent cramping.).    Marland Kitchen meloxicam (MOBIC) 15 MG tablet Take 15 mg by mouth once.    . methylPREDNISolone (MEDROL DOSEPAK) 4 MG TBPK tablet Sig as indicated (Patient not taking: Reported on  07/31/2017) 21 tablet 1  . naproxen (NAPROSYN) 500 MG tablet Take 1 tablet (500 mg total) by mouth 2 (two) times daily with a meal. (Patient not taking: Reported on 06/22/2017) 30 tablet 0   No current facility-administered medications for this visit.     Allergies: No Known Allergies  No past surgical history on file.  Social History   Social History  . Marital status: Single    Spouse name: N/A  . Number of children: N/A  . Years of education: N/A   Social History Main Topics  . Smoking status: Never Smoker  . Smokeless tobacco: Never Used  . Alcohol use 1.8 oz/week    3 Glasses of wine per week  . Drug use: No  . Sexual activity: Not Asked   Other Topics Concern  . None   Social History Narrative  . None    ROS Review of Systems See HPI Constitution: No fevers or chills No malaise No diaphoresis Skin: No rash or itching Eyes: no blurry vision, no double vision GU: no dysuria or hematuria Neuro: no dizziness or headaches  Objective: Vitals:   09/21/17 0946  BP: 138/80  Pulse: 89  Resp: 17  Temp: 98.4 F (36.9 C)  TempSrc: Oral  SpO2: 99%  Weight: (!) 328 lb 12.8 oz (149.1 kg)  Height: 6\' 3"  (1.905 m)    Physical Exam General: alert, oriented, in NAD Head: normocephalic, atraumatic, no sinus tenderness Eyes: EOM  intact, no scleral icterus or conjunctival injection Ears: TM bulging with mucus noted Nose: mucosa nonerythematous, nonedematous Throat: no pharyngeal exudate or erythema Lymph: no posterior auricular, submental or cervical lymph adenopathy Heart: normal rate, normal sinus rhythm, no murmurs Lungs: clear to auscultation bilaterally, no wheezing   Assessment and Plan Conny was seen today for cough.  Diagnoses and all orders for this visit:  Acute non-recurrent sinusitis, unspecified location -    Continue augmentin  Sinus congestion -    demonstrated proper technique for nasal spray and saline sinus rinse  Cough -  Added cough  syrup today -     HYDROcodone-homatropine (HYCODAN) 5-1.5 MG/5ML syrup; Take 5 mLs by mouth every 8 (eight) hours as needed for cough.     [place]

## 2018-01-09 DIAGNOSIS — M1710 Unilateral primary osteoarthritis, unspecified knee: Secondary | ICD-10-CM | POA: Insufficient documentation

## 2018-01-09 DIAGNOSIS — M179 Osteoarthritis of knee, unspecified: Secondary | ICD-10-CM | POA: Insufficient documentation

## 2018-02-13 ENCOUNTER — Telehealth: Payer: Self-pay | Admitting: *Deleted

## 2018-02-13 NOTE — Telephone Encounter (Signed)
Called patient left message in voice mail the [employer] form is completed and ready for pick up at [address] front desk.

## 2018-03-05 ENCOUNTER — Other Ambulatory Visit: Payer: Self-pay | Admitting: General Practice

## 2018-03-05 DIAGNOSIS — N529 Male erectile dysfunction, unspecified: Secondary | ICD-10-CM

## 2018-03-05 NOTE — Telephone Encounter (Signed)
Copied from [place] [id number]. Topic: Quick Communication - Rx Refill/Question >> Mar 05, 2018  4:32 PM Percell Belt A wrote: Medication:  sildenafil (VIAGRA) 100 MG tablet [[id number]]    Has the patient contacted their pharmacy? No    (Agent: If no, request that the patient contact the pharmacy for the refill.)   Preferred Pharmacy (with phone number or street name): [facility]    Agent: Please be advised that RX refills may take up to 3 business days. We ask that you follow-up with your pharmacy.

## 2018-03-06 NOTE — Telephone Encounter (Signed)
LOV 09/21/17 Dr. Mitchel [facility]

## 2018-03-20 ENCOUNTER — Telehealth: Payer: Self-pay | Admitting: *Deleted

## 2018-03-20 NOTE — Telephone Encounter (Signed)
Called patient's mobile number left message to call back concerning Rx request. I advised patient his call will be answered at call center and transferred to Primary Care at [facility].

## 2018-03-23 ENCOUNTER — Other Ambulatory Visit: Payer: Self-pay | Admitting: Emergency Medicine

## 2018-03-23 DIAGNOSIS — N529 Male erectile dysfunction, unspecified: Secondary | ICD-10-CM

## 2018-03-23 MED ORDER — SILDENAFIL CITRATE 100 MG PO TABS: ORAL_TABLET | ORAL | 6 refills | Status: DC

## 2018-08-05 ENCOUNTER — Emergency Department (HOSPITAL_COMMUNITY): Payer: Managed Care, Other (non HMO)

## 2018-08-05 ENCOUNTER — Other Ambulatory Visit: Payer: Self-pay

## 2018-08-05 ENCOUNTER — Emergency Department (HOSPITAL_COMMUNITY)
Admission: EM | Admit: 2018-08-05 | Discharge: 2018-08-05 | Disposition: A | Discharge status: Home / Self Care | Payer: Managed Care, Other (non HMO) | Attending: Emergency Medicine | Admitting: Emergency Medicine

## 2018-08-05 ENCOUNTER — Encounter (HOSPITAL_COMMUNITY): Payer: Self-pay

## 2018-08-05 DIAGNOSIS — R0602 Shortness of breath: Secondary | ICD-10-CM | POA: Insufficient documentation

## 2018-08-05 DIAGNOSIS — I10 Essential (primary) hypertension: Secondary | ICD-10-CM | POA: Insufficient documentation

## 2018-08-05 DIAGNOSIS — F419 Anxiety disorder, unspecified: Secondary | ICD-10-CM | POA: Diagnosis not present

## 2018-08-05 DIAGNOSIS — R0981 Nasal congestion: Secondary | ICD-10-CM | POA: Diagnosis present

## 2018-08-05 DIAGNOSIS — Z79899 Other long term (current) drug therapy: Secondary | ICD-10-CM | POA: Insufficient documentation

## 2018-08-05 HISTORY — DX: Essential (primary) hypertension: I10

## 2018-08-05 LAB — CBC
HCT: 46.5 % (ref 39.0–52.0)
Hemoglobin: 15.2 g/dL (ref 13.0–17.0)
MCH: 33.2 pg (ref 26.0–34.0)
MCHC: 32.7 g/dL (ref 30.0–36.0)
MCV: 101.5 fL — ABNORMAL HIGH (ref 78.0–100.0)
Platelets: 302 10*3/uL (ref 150–400)
RBC: 4.58 MIL/uL (ref 4.22–5.81)
RDW: 12.5 % (ref 11.5–15.5)
WBC: 7.7 10*3/uL (ref 4.0–10.5)

## 2018-08-05 LAB — BASIC METABOLIC PANEL
Anion gap: 11 (ref 5–15)
BUN: 13 mg/dL (ref 6–20)
CO2: 22 mmol/L (ref 22–32)
Calcium: 9.6 mg/dL (ref 8.9–10.3)
Chloride: 106 mmol/L (ref 98–111)
Creatinine, Ser: 1.01 mg/dL (ref 0.61–1.24)
GFR calc Af Amer: >60 mL/min (ref >60)
GFR calc non Af Amer: >60 mL/min (ref >60)
Glucose, Bld: 120 mg/dL — ABNORMAL HIGH (ref 70–99)
Potassium: 3.9 mmol/L (ref 3.5–5.1)
Sodium: 139 mmol/L (ref 135–145)

## 2018-08-05 MED ORDER — HYDROXYZINE HCL 25 MG PO TABS: 25.0000 mg | ORAL_TABLET | Freq: Three times a day (TID) | ORAL | 0 refills | Status: DC | PRN

## 2018-08-05 MED ORDER — SODIUM CHLORIDE 0.9 % IV BOLUS
1000.0000 mL | Freq: Once | INTRAVENOUS | Status: AC
  Administered 2018-08-05: 1000 mL via INTRAVENOUS

## 2018-08-05 NOTE — ED Provider Notes (Signed)
[facility] DEPT Provider Note   CSN: [id number] Arrival date & time: 08/05/18  0411     History   Chief Complaint Chief Complaint  Patient presents with  . Shortness of Breath  . Nasal Congestion    HPI Bobby Mcguire is a 52 y.o. male.  Patient presents with complaints of nasal and upper airway dryness, inability to sleep.  Patient reports that he uses a CPAP to sleep at night.  Tonight he felt like his airway was exceptionally dry despite adding humidity to his CPAP.  He became very restless, unable to sleep, kept putting the mask on and off.  He then became anxious and felt short of breath.  He thinks that he might of had a panic attack.  Patient also, however, reports that he did drink several beers before going to bed and he works outside, thinks he might be dehydrated.  No chest pain associated with the symptoms.     Past Medical History:  Diagnosis Date  . Hypertension   . Sleep apnea     Patient Active Problem List   Diagnosis Date Noted  . Acute non-recurrent sinusitis 09/19/2017  . Sinus congestion 09/19/2017  . Erectile dysfunction 09/19/2017  . Acute pain of left knee 06/27/2017  . Effusion of knee joint, left 06/27/2017  . Inflammation of joint of left knee 06/22/2017  . Essential hypertension 06/22/2017  . Obesity 08/18/2016  . Obstructive sleep apnea 05/07/2010    History reviewed. No pertinent surgical history.      Home Medications    Prior to Admission medications   Medication Sig Start Date End Date Taking? Authorizing Provider  amLODipine (NORVASC) 5 MG tablet Take 1 tablet (5 mg total) by mouth daily. 09/19/17   [name], MD  benzonatate (TESSALON) 200 MG capsule Take 1 capsule (200 mg total) by mouth 2 (two) times daily as needed for cough. 09/20/17   [name], MD  HYDROcodone-homatropine [facility]) 5-1.5 MG/5ML syrup Take 5 mLs by mouth every 8 (eight) hours as needed for cough.  09/21/17   Forrest Moron, MD  magnesium oxide (MAG-OX) 400 MG tablet Take 400 mg by mouth daily as needed (for excessive sweating to prevent cramping.).    [provider]  methylPREDNISolone (MEDROL DOSEPAK) 4 MG TBPK tablet Sig as indicated Patient not taking: Reported on 07/31/2017 06/27/17   [name], MD  Multiple Vitamin (MULTIVITAMIN WITH MINERALS) TABS tablet Take 1 tablet by mouth daily.    [provider]  Multiple Vitamins-Minerals (AIRBORNE PO) Take 1 packet by mouth as needed (to boost immune health.).     [provider]  naproxen (NAPROSYN) 500 MG tablet Take 1 tablet (500 mg total) by mouth 2 (two) times daily with a meal. Patient not taking: Reported on 06/22/2017 02/10/17   Leonie Douglas, PA-C  promethazine-dextromethorphan (PROMETHAZINE-DM) 6.25-15 MG/5ML syrup Take 5 mLs by mouth 4 (four) times daily as needed for cough. 09/20/17   [name], MD  sildenafil (VIAGRA) 100 MG tablet TAKE ONE-HALF (1/2) TO ONE TABLET DAILY AS NEEDED FOR ERECTILE DYSFUNCTION 03/23/18   [name], MD  triamcinolone (NASACORT) 55 MCG/ACT AERO nasal inhaler Place 2 sprays into the nose daily. 09/19/17   [name], MD    Family History Family History  Problem Relation Age of Onset  . Cancer Mother     Social History Social History   Tobacco Use  . Smoking status: Never Smoker  .  Smokeless tobacco: Never Used  Substance Use Topics  . Alcohol use: Yes    Alcohol/week: 3.0 standard drinks    Types: 3 Glasses of wine per week  . Drug use: No     Allergies   Patient has no known allergies.   Review of Systems Review of Systems  Respiratory: Positive for shortness of breath.   All other systems reviewed and are negative.    Physical Exam Updated Vital Signs Pulse 82   Temp 97.8 F (36.6 C) (Oral)   Resp 18   Ht 6\' 4"  (1.93 m)   Wt (!) 147.4 kg   SpO2 98%   BMI 39.56 kg/m   Physical Exam    Constitutional: He is oriented to person, place, and time. He appears well-developed and well-nourished. No distress.  HENT:  Head: Normocephalic and atraumatic.  Right Ear: Hearing normal.  Left Ear: Hearing normal.  Nose: Nose normal.  Mouth/Throat: Oropharynx is clear and moist and mucous membranes are normal.  Eyes: Pupils are equal, round, and reactive to light. Conjunctivae and EOM are normal.  Neck: Normal range of motion. Neck supple.  Cardiovascular: Regular rhythm, S1 normal and S2 normal. Exam reveals no gallop and no friction rub.  No murmur heard. Pulmonary/Chest: Effort normal and breath sounds normal. No respiratory distress. He exhibits no tenderness.  Abdominal: Soft. Normal appearance and bowel sounds are normal. There is no hepatosplenomegaly. There is no tenderness. There is no rebound, no guarding, no tenderness at McBurney's point and negative Murphy's sign. No hernia.  Musculoskeletal: Normal range of motion.  Neurological: He is alert and oriented to person, place, and time. He has normal strength. No cranial nerve deficit or sensory deficit. Coordination normal. GCS eye subscore is 4. GCS verbal subscore is 5. GCS motor subscore is 6.  Skin: Skin is warm, dry and intact. No rash noted. No cyanosis.  Psychiatric: He has a normal mood and affect. His speech is normal and behavior is normal. Thought content normal.  Nursing note and vitals reviewed.    ED Treatments / Results  Labs (all labs ordered are listed, but only abnormal results are displayed) Labs Reviewed  CBC  BASIC METABOLIC PANEL    EKG None  Radiology Dg Chest 2 View  Result Date: 08/05/2018 CLINICAL DATA:  Shortness of breath and congestion. EXAM: CHEST - 2 VIEW COMPARISON:  11/23/2015 FINDINGS: The heart size and mediastinal contours are within normal limits. Both lungs are clear. The visualized skeletal structures are unremarkable. IMPRESSION: No active cardiopulmonary disease.  Electronically Signed   By: Lucienne Capers M.D.   On: 08/05/2018 05:32    Procedures Procedures (including critical care time)  Medications Ordered in ED Medications  sodium chloride 0.9 % bolus 1,000 mL (1,000 mLs Intravenous New Bag/Given 08/05/18 0543)     Initial Impression / Assessment and Plan / ED Course  I have reviewed the triage vital signs and the nursing notes.  Pertinent labs & imaging results that were available during my care of the patient were reviewed by me and considered in my medical decision making (see chart for details).     Patient presents with complaints of difficulty breathing tonight.  He uses CPAP at night, felt like it was drying out his nasal and oral passages, could not tolerate the mask.  It sounds like he then had some anxiety and possibly a panic attack.  Patient reports feeling dehydrated, was administered IV fluids.  Clinically he did not look significantly dehydrated.  Lab work unremarkable.  Chest x-ray clear, no pneumonia, no pneumothorax, no evidence of congestive heart failure.  Patient reassured, prescribed Vistaril to be used as needed for anxiety and to help with sleep.  Follow-up with PCP.  Final Clinical Impressions(s) / ED Diagnoses   Final diagnoses:  Shortness of breath  Anxiety    ED Discharge Orders    None       Orpah Greek, MD 08/05/18 [phone]

## 2018-08-05 NOTE — ED Triage Notes (Addendum)
Pt presents to ED from home for SOB and congestion. Pt reports that he feels congested and feels like his CPAP isn't working right now. Pt thinks he might be dehydrated. +ETOH

## 2018-08-28 ENCOUNTER — Other Ambulatory Visit: Payer: Self-pay

## 2018-08-28 ENCOUNTER — Emergency Department (HOSPITAL_COMMUNITY)
Admission: EM | Admit: 2018-08-28 | Discharge: 2018-08-28 | Disposition: A | Discharge status: Home / Self Care | Payer: Managed Care, Other (non HMO) | Attending: Emergency Medicine | Admitting: Emergency Medicine

## 2018-08-28 ENCOUNTER — Encounter (HOSPITAL_COMMUNITY): Payer: Self-pay | Admitting: *Deleted

## 2018-08-28 DIAGNOSIS — R0981 Nasal congestion: Secondary | ICD-10-CM | POA: Insufficient documentation

## 2018-08-28 DIAGNOSIS — Z79899 Other long term (current) drug therapy: Secondary | ICD-10-CM | POA: Diagnosis not present

## 2018-08-28 DIAGNOSIS — I10 Essential (primary) hypertension: Secondary | ICD-10-CM | POA: Insufficient documentation

## 2018-08-28 MED ORDER — TRIAMCINOLONE ACETONIDE 55 MCG/ACT NA AERO: 2.0000 | INHALATION_SPRAY | Freq: Every day | NASAL | 12 refills | Status: DC

## 2018-08-28 NOTE — ED Triage Notes (Signed)
Pt with c/o nasal congestion that improved with Afrin usage. Pt says that he has had very dry mouth through the night. He has been unable to wear his cpap mask d/t these symptoms and unable to sleep. He does have some yellow mucous. Unsure of fevers.

## 2018-08-28 NOTE — ED Provider Notes (Signed)
[facility] DEPT Provider Note   CSN: [id number] Arrival date & time: 08/28/18  0636     History   Chief Complaint Chief Complaint  Patient presents with  . Nasal Congestion    HPI Bobby Mcguire is a 52 y.o. male.  HPI 52 year old male presents the emergency department with right nasal congestion.  Reports it is more difficult to sleep with his CPAP when he has nasal congestion.  He is tried Afrin without improvement in his symptoms.  He has no fevers or chills.  No shortness of breath.  No productive cough.  No other significant complaints.  Denies chest abdominal pain.  No fevers.  Symptoms are mild in severity.  He currently is without a primary care physician   Past Medical History:  Diagnosis Date  . Hypertension   . Sleep apnea     Patient Active Problem List   Diagnosis Date Noted  . Acute non-recurrent sinusitis 09/19/2017  . Sinus congestion 09/19/2017  . Erectile dysfunction 09/19/2017  . Acute pain of left knee 06/27/2017  . Effusion of knee joint, left 06/27/2017  . Inflammation of joint of left knee 06/22/2017  . Essential hypertension 06/22/2017  . Obesity 08/18/2016  . Obstructive sleep apnea 05/07/2010    History reviewed. No pertinent surgical history.      Home Medications    Prior to Admission medications   Medication Sig Start Date End Date Taking? Authorizing Provider  amLODipine (NORVASC) 5 MG tablet Take 1 tablet (5 mg total) by mouth daily. 09/19/17   [name], MD  Multiple Vitamin (MULTIVITAMIN WITH MINERALS) TABS tablet Take 1 tablet by mouth daily.    [provider]  Multiple Vitamins-Minerals (AIRBORNE PO) Take 1 packet by mouth as needed (to boost immune health.).     [provider]  sildenafil (VIAGRA) 100 MG tablet TAKE ONE-HALF (1/2) TO ONE TABLET DAILY AS NEEDED FOR ERECTILE DYSFUNCTION 03/23/18   [name], MD  triamcinolone (NASACORT) 55 MCG/ACT AERO  nasal inhaler Place 2 sprays into the nose daily. 08/28/18   Jola Schmidt, MD    Family History Family History  Problem Relation Age of Onset  . Cancer Mother     Social History Social History   Tobacco Use  . Smoking status: Never Smoker  . Smokeless tobacco: Never Used  Substance Use Topics  . Alcohol use: Yes    Alcohol/week: 3.0 standard drinks    Types: 3 Glasses of wine per week  . Drug use: No     Allergies   Patient has no known allergies.   Review of Systems Review of Systems  All other systems reviewed and are negative.    Physical Exam Updated Vital Signs BP (!) 178/96 (BP Location: Left Arm)   Pulse 99   Temp 98.9 F (37.2 C) (Oral)   Resp 18   SpO2 97%   Physical Exam  Constitutional: He is oriented to person, place, and time. He appears well-developed and well-nourished.  HENT:  Head: Normocephalic and atraumatic.  Right turbinates swollen with rather significant narrowing of the right nasal passage.  Left nare is normal  Eyes: EOM are normal.  Neck: Normal range of motion.  Cardiovascular: Normal rate and regular rhythm.  Pulmonary/Chest: Effort normal and breath sounds normal. No respiratory distress.  Abdominal: Soft. He exhibits no distension. There is no tenderness.  Musculoskeletal: Normal range of motion.  Neurological: He is alert and oriented to person, place, and time.  Skin: Skin is warm and dry.  Psychiatric: He has a normal mood and affect. Judgment normal.  Nursing note and vitals reviewed.    ED Treatments / Results  Labs (all labs ordered are listed, but only abnormal results are displayed) Labs Reviewed - No data to display  EKG None  Radiology No results found.  Procedures Procedures (including critical care time)  Medications Ordered in ED Medications - No data to display   Initial Impression / Assessment and Plan / ED Course  I have reviewed the triage vital signs and the nursing notes.  Pertinent labs  & imaging results that were available during my care of the patient were reviewed by me and considered in my medical decision making (see chart for details).     Will trial the patient on nasal steroids.  Primary care follow-up.  Overall well-appearing.  Final Clinical Impressions(s) / ED Diagnoses   Final diagnoses:  Nasal congestion    ED Discharge Orders         Ordered    triamcinolone (NASACORT) 55 MCG/ACT AERO nasal inhaler  Daily     08/28/18 0745           Jola Schmidt, MD 08/28/18 [phone]

## 2018-08-28 NOTE — Discharge Instructions (Signed)
[place]  Please find a primary care physician in the community

## 2018-08-31 ENCOUNTER — Ambulatory Visit: Payer: Managed Care, Other (non HMO) | Admitting: Emergency Medicine

## 2018-09-04 ENCOUNTER — Other Ambulatory Visit: Payer: Self-pay | Admitting: Emergency Medicine

## 2018-09-04 DIAGNOSIS — I10 Essential (primary) hypertension: Secondary | ICD-10-CM

## 2018-11-01 ENCOUNTER — Encounter: Payer: Self-pay | Admitting: Podiatry

## 2018-11-01 ENCOUNTER — Other Ambulatory Visit: Payer: Self-pay | Admitting: Podiatry

## 2018-11-01 ENCOUNTER — Ambulatory Visit (INDEPENDENT_AMBULATORY_CARE_PROVIDER_SITE_OTHER): Payer: Managed Care, Other (non HMO) | Admitting: Podiatry

## 2018-11-01 ENCOUNTER — Ambulatory Visit (INDEPENDENT_AMBULATORY_CARE_PROVIDER_SITE_OTHER): Payer: Managed Care, Other (non HMO)

## 2018-11-01 DIAGNOSIS — L600 Ingrowing nail: Secondary | ICD-10-CM | POA: Diagnosis not present

## 2018-11-01 DIAGNOSIS — R208 Other disturbances of skin sensation: Secondary | ICD-10-CM | POA: Diagnosis not present

## 2018-11-01 DIAGNOSIS — M779 Enthesopathy, unspecified: Secondary | ICD-10-CM

## 2018-11-01 DIAGNOSIS — R200 Anesthesia of skin: Secondary | ICD-10-CM | POA: Diagnosis not present

## 2018-11-01 NOTE — Progress Notes (Signed)
Subjective:   Patient ID: Bobby Mcguire, male   DOB: 52 y.o.   MRN: [id number]   HPI Patient presents stating that he is been getting some burning in his feet with generalized foot pain at times and has severe nail disease with thick yellow brittle nailbeds 1 through 5 right 2 through 5 left with history of having had the big nails removed with the right when having partially grown back.  States they are very tender and he cannot wear shoe gear comfortably.  Patient does not smoke and likes to be active   Review of Systems  All other systems reviewed and are negative.       Objective:  Physical Exam  Constitutional: He appears well-developed and well-nourished.  Cardiovascular: Intact distal pulses.  Pulmonary/Chest: Effort normal.  Musculoskeletal: Normal range of motion.  Neurological: He is alert.  Skin: Skin is warm.  Nursing note and vitals reviewed.   Neurovascular status intact muscle strength is adequate range of motion within normal limits with patient found to have moderate discomfort in the dorsum of the right foot right first MPJ localized with only mild discomfort with severe nail disease 1 through 5 right 2 through 5 left with incurvated nails that gets sore and are impossible for him to cut and are becoming increasingly difficult to reach due to obesity     Assessment:  Mild to moderate tendinitis with hallux limitus deformity right with severe nail disease 1 through 5 right 2 through 5 left     Plan:  H&P reviewed condition and I do think long-term nail removal of the remaining nails is necessary due to his inability to cut increased pain thickness and bleeding when he tries to cut them himself.  He wants to get them done and will do one foot at a time and will do the right foot first and is scheduled to have this done.  As far as the other conditions go I do not recommend treatment unless symptoms were to get worse and I educated him on hallux limitus and midtarsal  joint arthritis  X-rays indicate that there is moderate narrowing of the joint first MPJ with spur formation with inflammation and spur formation around the midtarsal joint right and left

## 2018-11-05 ENCOUNTER — Other Ambulatory Visit (INDEPENDENT_AMBULATORY_CARE_PROVIDER_SITE_OTHER): Payer: Managed Care, Other (non HMO)

## 2018-11-05 ENCOUNTER — Encounter: Payer: Self-pay | Admitting: Family

## 2018-11-05 ENCOUNTER — Ambulatory Visit (INDEPENDENT_AMBULATORY_CARE_PROVIDER_SITE_OTHER): Payer: Managed Care, Other (non HMO) | Admitting: Family

## 2018-11-05 VITALS — BP 150/84 | HR 72 | Temp 98.3°F | Ht 76.0 in | Wt 342.0 lb

## 2018-11-05 DIAGNOSIS — Z1321 Encounter for screening for nutritional disorder: Secondary | ICD-10-CM | POA: Diagnosis not present

## 2018-11-05 DIAGNOSIS — I10 Essential (primary) hypertension: Secondary | ICD-10-CM | POA: Diagnosis not present

## 2018-11-05 DIAGNOSIS — Z0000 Encounter for general adult medical examination without abnormal findings: Secondary | ICD-10-CM

## 2018-11-05 DIAGNOSIS — R7301 Impaired fasting glucose: Secondary | ICD-10-CM | POA: Diagnosis not present

## 2018-11-05 DIAGNOSIS — Z125 Encounter for screening for malignant neoplasm of prostate: Secondary | ICD-10-CM

## 2018-11-05 DIAGNOSIS — G4730 Sleep apnea, unspecified: Secondary | ICD-10-CM

## 2018-11-05 DIAGNOSIS — Z13220 Encounter for screening for lipoid disorders: Secondary | ICD-10-CM | POA: Diagnosis not present

## 2018-11-05 LAB — CBC WITH DIFFERENTIAL/PLATELET
Basophils Absolute: 0.1 10*3/uL (ref 0.0–0.1)
Basophils Relative: 1.6 % (ref 0.0–3.0)
Eosinophils Absolute: 0.1 10*3/uL (ref 0.0–0.7)
Eosinophils Relative: 0.7 % (ref 0.0–5.0)
HCT: 43.7 % (ref 39.0–52.0)
Hemoglobin: 14.7 g/dL (ref 13.0–17.0)
Lymphocytes Relative: 31.7 % (ref 12.0–46.0)
Lymphs Abs: 2.9 10*3/uL (ref 0.7–4.0)
MCHC: 33.6 g/dL (ref 30.0–36.0)
MCV: 99.7 fl (ref 78.0–100.0)
Monocytes Absolute: 0.7 10*3/uL (ref 0.1–1.0)
Monocytes Relative: 7.3 % (ref 3.0–12.0)
Neutro Abs: 5.3 10*3/uL (ref 1.4–7.7)
Neutrophils Relative %: 58.7 % (ref 43.0–77.0)
Platelets: 301 10*3/uL (ref 150.0–400.0)
RBC: 4.39 Mil/uL (ref 4.22–5.81)
RDW: 13.3 % (ref 11.5–15.5)
WBC: 9 10*3/uL (ref 4.0–10.5)

## 2018-11-05 LAB — VITAMIN D 25 HYDROXY (VIT D DEFICIENCY, FRACTURES): VITD: 25.08 ng/mL — ABNORMAL LOW (ref 30.00–100.00)

## 2018-11-05 LAB — COMPREHENSIVE METABOLIC PANEL
ALT: 28 U/L (ref 0–53)
AST: 21 U/L (ref 0–37)
Albumin: 4.6 g/dL (ref 3.5–5.2)
Alkaline Phosphatase: 62 U/L (ref 39–117)
BUN: 13 mg/dL (ref 6–23)
CO2: 25 mEq/L (ref 19–32)
Calcium: 10 mg/dL (ref 8.4–10.5)
Chloride: 102 mEq/L (ref 96–112)
Creatinine, Ser: 1.1 mg/dL (ref 0.40–1.50)
GFR: 90.25 mL/min (ref >60.00)
Glucose, Bld: 115 mg/dL — ABNORMAL HIGH (ref 70–99)
Potassium: 4.1 mEq/L (ref 3.5–5.1)
Sodium: 139 mEq/L (ref 135–145)
Total Bilirubin: 0.8 mg/dL (ref 0.2–1.2)
Total Protein: 7.7 g/dL (ref 6.0–8.3)

## 2018-11-05 LAB — LIPID PANEL
Cholesterol: 190 mg/dL (ref 0–200)
HDL: 42.1 mg/dL (ref >39.00)
NonHDL: 147.83
Total CHOL/HDL Ratio: 5
Triglycerides: 283 mg/dL — ABNORMAL HIGH (ref 0.0–149.0)
VLDL: 56.6 mg/dL — ABNORMAL HIGH (ref 0.0–40.0)

## 2018-11-05 LAB — HEMOGLOBIN A1C: Hgb A1c MFr Bld: 5 % (ref 4.6–6.5)

## 2018-11-05 LAB — LDL CHOLESTEROL, DIRECT: Direct LDL: 117 mg/dL

## 2018-11-05 LAB — PSA: PSA: 3.75 ng/mL (ref 0.10–4.00)

## 2018-11-05 MED ORDER — HYDROXYZINE HCL 25 MG PO TABS: 25.0000 mg | ORAL_TABLET | Freq: Three times a day (TID) | ORAL | 0 refills | Status: DC | PRN

## 2018-11-05 MED ORDER — AMLODIPINE BESYLATE 10 MG PO TABS: 10.0000 mg | ORAL_TABLET | Freq: Every day | ORAL | 1 refills | Status: DC

## 2018-11-05 NOTE — Progress Notes (Signed)
Bobby Mcguire is a 52 y.o. male with the following history as recorded in [employer]:  Patient Active Problem List   Diagnosis Date Noted  . Osteoarthritis of knee 01/09/2018  . Acute non-recurrent sinusitis 09/19/2017  . Sinus congestion 09/19/2017  . Erectile dysfunction 09/19/2017  . Acute pain of left knee 06/27/2017  . Effusion of knee joint, left 06/27/2017  . Inflammation of joint of left knee 06/22/2017  . Essential hypertension 06/22/2017  . Elevated blood pressure reading 06/06/2017  . Screening for colon cancer 06/06/2017  . Obesity 08/18/2016  . Obstructive sleep apnea 05/07/2010    Current Outpatient Medications  Medication Sig Dispense Refill  . ibuprofen (ADVIL,MOTRIN) 800 MG tablet ibuprofen 800 mg tablet  TAKE 2 TABLETS TWICE A DAY    . Multiple Vitamin (MULTIVITAMIN WITH MINERALS) TABS tablet Take 1 tablet by mouth daily.    . sildenafil (VIAGRA) 100 MG tablet TAKE ONE-HALF (1/2) TO ONE TABLET DAILY AS NEEDED FOR ERECTILE DYSFUNCTION 30 tablet 6  . amLODipine (NORVASC) 10 MG tablet Take 1 tablet (10 mg total) by mouth daily. 90 tablet 1  . hydrOXYzine (ATARAX/VISTARIL) 25 MG tablet Take 1 tablet (25 mg total) by mouth 3 (three) times daily as needed. 90 tablet 0  . Ibuprofen-Famotidine (DUEXIS) 800-26.6 MG TABS Duexis 800 mg-26.6 mg tablet  Take 1 tablet 3 times a day by oral route.    . Multiple Vitamins-Minerals (AIRBORNE PO) Take 1 packet by mouth as needed (to boost immune health.).      No current facility-administered medications for this visit.     Allergies: Other  Past Medical History:  Diagnosis Date  . Hypertension   . Sleep apnea     History reviewed. No pertinent surgical history.  Family History  Problem Relation Age of Onset  . Cancer Mother     Social History   Tobacco Use  . Smoking status: Never Smoker  . Smokeless tobacco: Never Used  Substance Use Topics  . Alcohol use: Yes    Alcohol/week: 3.0 standard drinks    Types: 3  Glasses of wine per week    Subjective:  Patient presents today as a new patient; needs yearly CPE;  1) History of sleep apnea- needs updated sleep study; thinks he needs new CPAP machine; 2) History of hypertension- taking Amlodipine once per day; diagnosed in the past 6-7 months;  3) History of chronic knee pain- uses Duexis occasionally; under care of Dr. Cay Schillings- does have injections occasionally;  Sees dentist every 6 months; sees eye doctor yearly;  Colonoscopy done in 2018- repeat in 2021   Review of Systems  Constitutional: Negative.   HENT: Negative for ear pain.   Eyes: Negative for blurred vision.  Respiratory: Negative for cough and shortness of breath.   Cardiovascular: Negative for chest pain and palpitations.  Gastrointestinal: Negative for abdominal pain.  Genitourinary: Negative for dysuria.  Musculoskeletal: Negative for myalgias.  Skin: Negative.   Neurological: Negative for dizziness and headaches.  Psychiatric/Behavioral: Negative for depression. The patient has insomnia.        Sleep apnea not well controlled      Objective:  Vitals:   11/05/18 0928  BP: (!) 150/84  Pulse: 72  Temp: 98.3 F (36.8 C)  TempSrc: Oral  SpO2: 97%  Weight: (!) 342 lb (155.1 kg)  Height: 6' 4"  (1.93 m)    General: Well developed, well nourished, in no acute distress  Skin : Warm and dry.  Head: Normocephalic and atraumatic  Eyes: Sclera and conjunctiva clear; pupils round and reactive to light; extraocular movements intact  Ears: External normal; canals clear; tympanic membranes normal  Oropharynx: Pink, supple. No suspicious lesions  Neck: Supple without thyromegaly, adenopathy  Lungs: Respirations unlabored; clear to auscultation bilaterally without wheeze, rales, rhonchi  CVS exam: normal rate and regular rhythm.  Abdomen: Soft; nontender; nondistended; normoactive bowel sounds; no masses or hepatosplenomegaly  Musculoskeletal: No deformities; no active joint  inflammation  Extremities: No edema, cyanosis, clubbing  Vessels: Symmetric bilaterally  Neurologic: Alert and oriented; speech intact; face symmetrical; moves all extremities well; CNII-XII intact without focal deficit  Assessment:  1. PE (physical exam), annual   2. Essential hypertension   3. Lipid screening   4. Prostate cancer screening   5. Encounter for vitamin deficiency screening   6. Elevated fasting glucose   7. Sleep apnea, unspecified type     Plan:  Age appropriate preventive healthcare needs addressed; encouraged regular eye doctor and dental exams; encouraged regular exercise; discussed need for weight loss, limit salt intake; will update labs and refills as needed today; follow-up to be determined; Sleep study updated- suspect correcting sleep apnea will make a huge improvement in his health needs;  Increase Amlodipine to 10 mg daily- re-check in 2 months, sooner prn based on labs;    No follow-ups on file.  Orders Placed This Encounter  Procedures  . CBC w/Diff    Standing Status:   Future    Standing Expiration Date:   11/05/2019  . Comp Met (CMET)    Standing Status:   Future    Standing Expiration Date:   11/05/2019  . Lipid panel    Standing Status:   Future    Standing Expiration Date:   11/06/2019  . PSA    Standing Status:   Future    Standing Expiration Date:   11/05/2019  . Vitamin D (25 hydroxy)    Standing Status:   Future    Standing Expiration Date:   11/05/2019  . HgB A1c    Standing Status:   Future    Standing Expiration Date:   11/05/2019  . Ambulatory referral to Neurology    Referral Priority:   Routine    Referral Type:   Consultation    Referral Reason:   Specialty Services Required    Requested Specialty:   Neurology    Number of Visits Requested:   1    Requested Prescriptions   Signed Prescriptions Disp Refills  . amLODipine (NORVASC) 10 MG tablet 90 tablet 1    Sig: Take 1 tablet (10 mg total) by mouth daily.  .  hydrOXYzine (ATARAX/VISTARIL) 25 MG tablet 90 tablet 0    Sig: Take 1 tablet (25 mg total) by mouth 3 (three) times daily as needed.

## 2018-11-06 ENCOUNTER — Other Ambulatory Visit: Payer: Self-pay | Admitting: Family

## 2018-11-06 MED ORDER — ROSUVASTATIN CALCIUM 10 MG PO TABS: 10.0000 mg | ORAL_TABLET | Freq: Every day | ORAL | 0 refills | Status: DC

## 2018-11-06 MED ORDER — VITAMIN D (ERGOCALCIFEROL) 1.25 MG (50000 UNIT) PO CAPS: 50000.0000 [IU] | ORAL_CAPSULE | ORAL | 0 refills | Status: DC

## 2018-11-09 ENCOUNTER — Encounter: Payer: Self-pay | Admitting: Podiatry

## 2018-11-09 ENCOUNTER — Other Ambulatory Visit: Payer: Self-pay | Admitting: Family

## 2018-11-09 ENCOUNTER — Ambulatory Visit (INDEPENDENT_AMBULATORY_CARE_PROVIDER_SITE_OTHER): Payer: Managed Care, Other (non HMO) | Admitting: Podiatry

## 2018-11-09 DIAGNOSIS — L600 Ingrowing nail: Secondary | ICD-10-CM | POA: Diagnosis not present

## 2018-11-09 MED ORDER — VITAMIN D (ERGOCALCIFEROL) 1.25 MG (50000 UNIT) PO CAPS: 50000.0000 [IU] | ORAL_CAPSULE | ORAL | 0 refills | Status: AC

## 2018-11-09 MED ORDER — NEOMYCIN-POLYMYXIN-HC 3.5-10000-1 OT SOLN: OTIC | 1 refills | Status: DC

## 2018-11-09 MED ORDER — ROSUVASTATIN CALCIUM 10 MG PO TABS: 10.0000 mg | ORAL_TABLET | Freq: Every day | ORAL | 0 refills | Status: DC

## 2018-11-09 MED ORDER — HYDROCODONE-ACETAMINOPHEN 10-325 MG PO TABS: 1.0000 | ORAL_TABLET | Freq: Three times a day (TID) | ORAL | 0 refills | Status: DC | PRN

## 2018-11-09 NOTE — Patient Instructions (Signed)

## 2018-11-11 NOTE — Progress Notes (Signed)
Subjective:   Patient ID: Bobby Mcguire, male   DOB: 52 y.o.   MRN: [id number]   HPI Patient presents with severe nail disease bilateral with the right being worse than the left with both of them being very painful with all nails on the right be involved in the second through the fifth of the left being involved.  They are painful and incurvated he cannot cut them   ROS      Objective:  Physical Exam  Neurovascular status intact with thick yellow brittle nailbeds 1-5 both feet and thick yellow brittle nailbeds when palpated     Assessment:  Chronic mycotic nail infections with pain 1-5 right 2 through 5 left     Plan:  H&P reviewed conditions and recommended the utilization of nail removal.  Explained surgery and reviewed that there is no guarantee long-term and that recovery will take several weeks.  Patient is amenable to this and will do the right once today and I infiltrated the right forefoot with 240 mg like Marcaine mixture sterile prep applied all digits and using sterile instrumentation I remove nailbeds 1 through 5 right exposed matrix and applied phenol 3 applications 30 seconds followed by alcohol lavage sterile dressing and instructed on soaks and to leave the dressings on 24 hours and if they were to start to throb to take them off earlier.  Patient will be seen back in 2 weeks for consideration of left and was encouraged to call with any questions concerns

## 2018-11-19 ENCOUNTER — Telehealth: Payer: Self-pay | Admitting: Podiatry

## 2018-11-19 NOTE — Telephone Encounter (Signed)
Left message to call again. 

## 2018-11-19 NOTE — Telephone Encounter (Signed)
Left foot is doing some swelling and he needs to know what to do next. Please give patient a call

## 2018-11-19 NOTE — Telephone Encounter (Signed)
Called patient to schedule an appointment  With our office. No answer/lvm

## 2018-11-19 NOTE — Telephone Encounter (Signed)
Pt called back and states the right foot is puffy at the forefoot and purplish. Pt states his toe was white after he took the blue dressing off after the procedure and so he has been just leaving it off. I told pt he needed to continue the epsom salt soaks and drops and I would get him in to have the toe checked.

## 2018-11-20 ENCOUNTER — Telehealth: Payer: Self-pay | Admitting: *Deleted

## 2018-11-20 ENCOUNTER — Ambulatory Visit (INDEPENDENT_AMBULATORY_CARE_PROVIDER_SITE_OTHER): Payer: Self-pay | Admitting: Podiatry

## 2018-11-20 DIAGNOSIS — L600 Ingrowing nail: Secondary | ICD-10-CM

## 2018-11-20 MED ORDER — CEPHALEXIN 500 MG PO CAPS: 500.0000 mg | ORAL_CAPSULE | Freq: Three times a day (TID) | ORAL | 0 refills | Status: DC

## 2018-11-20 MED ORDER — HYDROCODONE-ACETAMINOPHEN 10-325 MG PO TABS: 1.0000 | ORAL_TABLET | Freq: Three times a day (TID) | ORAL | 0 refills | Status: DC | PRN

## 2018-11-20 NOTE — Patient Instructions (Signed)

## 2018-11-20 NOTE — Telephone Encounter (Signed)
Pt states his toe is still the same and he did not get a call to schedule yesterday. I told pt I would hand deliver a message to a scheduler and have them call him. Pt request call to the mobile line.

## 2018-11-27 ENCOUNTER — Ambulatory Visit: Payer: Managed Care, Other (non HMO)

## 2018-11-27 DIAGNOSIS — L600 Ingrowing nail: Secondary | ICD-10-CM

## 2018-11-27 NOTE — Progress Notes (Signed)
Subjective: 52 year old male presents the office today for concerns of increasing swelling to his right foot.  He recently underwent total nail avulsions with Dr. Paulla Dolly on toes 1-5 on the right foot.  He has been soaking his foot daily as well as using antibiotic drops.  He is also asking for refill of his pain medication today.  Denies any drainage or pus coming from the area. Denies any systemic complaints such as fevers, chills, nausea, vomiting. No acute changes since last appointment, and no other complaints at this time.   Objective: AAO x3, NAD DP/PT pulses palpable bilaterally, CRT less than 3 seconds Status post total nail avulsions to nails 1 through 5 on the right foot with a granular wound base.  Small amount of fibrotic tissue present in the right second nail bed.  There is no purulence identified but there is some macerated tissue to the area.  There is mild swelling to the right toes as well as the forefoot.  There is no erythema or ascending sialitis.  No open lesions or pre-ulcerative lesions.  No pain with calf compression, swelling, warmth, erythema  Assessment: Edema status post total nail avulsions right foot  Plan: -All treatment options discussed with the patient including all alternatives, risks, complications.  -Increasing edema recently with certain antibiotic.  Prescribed Keflex for him.  Refill his pain medicine.  Recommend he continue Epsom salts soaks daily.  Continue daily dressing changes.  Monitoring signs or symptoms of infection to let us know or go to the ER should any occur. -Follow-up in 1 week with Dr. Paulla Dolly or sooner if needed. -Patient encouraged to call the office with any questions, concerns, change in symptoms.   Trula Slade DPM

## 2018-11-29 NOTE — Progress Notes (Signed)
Patient is here today for follow-up appointment, he recently underwent total nail avulsions with Dr. Paulla Dolly on toes 1 through 5 right foot.  He states that he feels like they are healing well, and they do not hurt as much.  Patient did state that he went to the gym and soaked in the whirlpool as well.  Mild swelling to toes 1 through 3 right foot, this has improved since last week.  There is no erythema, no purulent drainage.  Macerated skin has cleared up, and the nails appear to be scabbing over at this time.  Discussed signs and symptoms of infection with the patient, verbal and written instructions were given, he is to follow-up as needed with any acute symptom changes.

## 2018-12-04 ENCOUNTER — Ambulatory Visit: Payer: Managed Care, Other (non HMO)

## 2018-12-04 DIAGNOSIS — L600 Ingrowing nail: Secondary | ICD-10-CM

## 2018-12-05 NOTE — Progress Notes (Signed)
Patient is here today for follow-up appointment, he recently underwent total nail avulsion with Dr. Paulla Dolly on toes 1 through 5 right foot.  He states that he feels like they are healing well and some of the swelling has improved.  Improved swelling to toes 1 through 3 right foot, no redness, no erythema, no drainage.  The areas are scabbed over and appear to be healing well.  Discussed signs and symptoms of infection with patient.  He is to return as needed with any acute symptom changes.

## 2018-12-06 ENCOUNTER — Encounter: Payer: Self-pay | Admitting: Family

## 2018-12-25 ENCOUNTER — Other Ambulatory Visit: Payer: Self-pay | Admitting: Family

## 2019-01-07 ENCOUNTER — Encounter: Payer: Self-pay | Admitting: Family

## 2019-01-07 ENCOUNTER — Other Ambulatory Visit (INDEPENDENT_AMBULATORY_CARE_PROVIDER_SITE_OTHER): Payer: BLUE CROSS/BLUE SHIELD

## 2019-01-07 ENCOUNTER — Ambulatory Visit (INDEPENDENT_AMBULATORY_CARE_PROVIDER_SITE_OTHER): Payer: BLUE CROSS/BLUE SHIELD | Admitting: Family

## 2019-01-07 VITALS — BP 158/80 | HR 77 | Temp 97.9°F | Ht 76.0 in | Wt 348.1 lb

## 2019-01-07 DIAGNOSIS — I10 Essential (primary) hypertension: Secondary | ICD-10-CM | POA: Diagnosis not present

## 2019-01-07 DIAGNOSIS — R972 Elevated prostate specific antigen [PSA]: Secondary | ICD-10-CM

## 2019-01-07 DIAGNOSIS — N529 Male erectile dysfunction, unspecified: Secondary | ICD-10-CM

## 2019-01-07 DIAGNOSIS — E785 Hyperlipidemia, unspecified: Secondary | ICD-10-CM

## 2019-01-07 LAB — COMPREHENSIVE METABOLIC PANEL
ALT: 27 U/L (ref 0–53)
AST: 18 U/L (ref 0–37)
Albumin: 4.8 g/dL (ref 3.5–5.2)
Alkaline Phosphatase: 69 U/L (ref 39–117)
BUN: 13 mg/dL (ref 6–23)
CO2: 25 mEq/L (ref 19–32)
Calcium: 10.2 mg/dL (ref 8.4–10.5)
Chloride: 104 mEq/L (ref 96–112)
Creatinine, Ser: 1.05 mg/dL (ref 0.40–1.50)
GFR: 89.54 mL/min (ref >60.00)
Glucose, Bld: 105 mg/dL — ABNORMAL HIGH (ref 70–99)
Potassium: 4.2 mEq/L (ref 3.5–5.1)
Sodium: 140 mEq/L (ref 135–145)
Total Bilirubin: 0.4 mg/dL (ref 0.2–1.2)
Total Protein: 7.8 g/dL (ref 6.0–8.3)

## 2019-01-07 LAB — LIPID PANEL
Cholesterol: 155 mg/dL (ref 0–200)
HDL: 32.4 mg/dL — ABNORMAL LOW (ref >39.00)
Total CHOL/HDL Ratio: 5
Triglycerides: 403 mg/dL — ABNORMAL HIGH (ref 0.0–149.0)

## 2019-01-07 LAB — PSA: PSA: 3.67 ng/mL (ref 0.10–4.00)

## 2019-01-07 LAB — LDL CHOLESTEROL, DIRECT: Direct LDL: 66 mg/dL

## 2019-01-07 MED ORDER — LOSARTAN POTASSIUM 100 MG PO TABS: 100.0000 mg | ORAL_TABLET | Freq: Every day | ORAL | 1 refills | Status: DC

## 2019-01-07 NOTE — Addendum Note (Signed)
Addended by: Trenda Moots on: 5/61/2548 10:21 AM   Modules accepted: Orders

## 2019-01-07 NOTE — Progress Notes (Signed)
Bobby Mcguire is a 53 y.o. male with the following history as recorded in [employer]:  Patient Active Problem List   Diagnosis Date Noted  . Osteoarthritis of knee 01/09/2018  . Acute non-recurrent sinusitis 09/19/2017  . Sinus congestion 09/19/2017  . Erectile dysfunction 09/19/2017  . Acute pain of left knee 06/27/2017  . Effusion of knee joint, left 06/27/2017  . Inflammation of joint of left knee 06/22/2017  . Essential hypertension 06/22/2017  . Elevated blood pressure reading 06/06/2017  . Screening for colon cancer 06/06/2017  . Obesity 08/18/2016  . Obstructive sleep apnea 05/07/2010    Current Outpatient Medications  Medication Sig Dispense Refill  . amLODipine (NORVASC) 10 MG tablet Take 1 tablet (10 mg total) by mouth daily. 90 tablet 1  . HYDROcodone-acetaminophen (NORCO) 10-325 MG tablet Take 1 tablet by mouth every 8 (eight) hours as needed. 21 tablet 0  . hydrOXYzine (ATARAX/VISTARIL) 25 MG tablet TAKE 1 TABLET THREE TIMES A DAY AS NEEDED 90 tablet 1  . ibuprofen (ADVIL,MOTRIN) 800 MG tablet ibuprofen 800 mg tablet  TAKE 2 TABLETS TWICE A DAY    . Ibuprofen-Famotidine (DUEXIS) 800-26.6 MG TABS Duexis 800 mg-26.6 mg tablet  Take 1 tablet 3 times a day by oral route.    . Multiple Vitamin (MULTIVITAMIN WITH MINERALS) TABS tablet Take 1 tablet by mouth daily.    . Multiple Vitamins-Minerals (AIRBORNE PO) Take 1 packet by mouth as needed (to boost immune health.).     Marland Kitchen neomycin-polymyxin-hydrocortisone (CORTISPORIN) OTIC solution Apply 1-2 drops to toe after soaking BID 10 mL 1  . rosuvastatin (CRESTOR) 10 MG tablet Take 1 tablet (10 mg total) by mouth daily. 90 tablet 0  . sildenafil (VIAGRA) 100 MG tablet TAKE ONE-HALF (1/2) TO ONE TABLET DAILY AS NEEDED FOR ERECTILE DYSFUNCTION 30 tablet 6  . Vitamin D, Ergocalciferol, (DRISDOL) 1.25 MG (50000 UT) CAPS capsule Take 1 capsule (50,000 Units total) by mouth every 7 (seven) days for 12 doses. 12 capsule 0  . losartan  (COZAAR) 100 MG tablet Take 1 tablet (100 mg total) by mouth daily. 30 tablet 1   No current facility-administered medications for this visit.     Allergies: Other  Past Medical History:  Diagnosis Date  . Hypertension   . Sleep apnea     History reviewed. No pertinent surgical history.  Family History  Problem Relation Age of Onset  . Cancer Mother     Social History   Tobacco Use  . Smoking status: Never Smoker  . Smokeless tobacco: Never Used  Substance Use Topics  . Alcohol use: Yes    Alcohol/week: 3.0 standard drinks    Types: 3 Glasses of wine per week    Subjective:  2 month follow-up on hypertension; at last OV, Amlodipine was increased to 10 mg daily; unfortunately, patient has gained 6 pounds since his last OV; Denies any chest pain, shortness of breath, blurred vision or headache. Has been taking his Crestor as prescribed- tolerating well/ no aches, pains;  Scheduled for sleep study in February 2020;  Admits he is drinking "lots of Gatorade" to stay hydrated- not drinking enough water; very surprised to see he has gained 6 pounds since last office visit.    Objective:  Vitals:   01/07/19 0950  BP: (!) 158/80  Pulse: 77  Temp: 97.9 F (36.6 C)  TempSrc: Oral  SpO2: 96%  Weight: (!) 348 lb 1.3 oz (157.9 kg)  Height: 6' 4"  (1.93 m)    General:  Well developed, well nourished, in no acute distress  Skin : Warm and dry.  Head: Normocephalic and atraumatic  Eyes: Sclera and conjunctiva clear; pupils round and reactive to light; extraocular movements intact  Ears: External normal; canals clear; tympanic membranes normal  Oropharynx: Pink, supple. No suspicious lesions  Neck: Supple without thyromegaly, adenopathy  Lungs: Respirations unlabored; clear to auscultation bilaterally without wheeze, rales, rhonchi  CVS exam: normal rate and regular rhythm.  Neurologic: Alert and oriented; speech intact; face symmetrical; moves all extremities well; CNII-XII intact  without focal deficit   Assessment:  1. Essential hypertension   2. Hyperlipidemia, unspecified hyperlipidemia type   3. Abnormal PSA   4. Erectile dysfunction, unspecified erectile dysfunction type     Plan:  1. Uncontrolled; continue Amlodipine 10 mg; add Losartan 100 mg daily; follow-up in 3 weeks; stressed need to cut down on beer/ Gatorade- needs to drink more water/ limit sugar and fat intake; 2. Update lipid panel, CMP today;  3. & 4. Check PSA, testosterone level today;   Return in about 3 weeks (around 01/28/2019).  Orders Placed This Encounter  Procedures  . Comp Met (CMET)    Standing Status:   Future    Standing Expiration Date:   01/07/2020  . Lipid panel    Standing Status:   Future    Standing Expiration Date:   01/08/2020  . PSA    Standing Status:   Future    Standing Expiration Date:   01/07/2020  . Testosterone, Free, Total, SHBG    Standing Status:   Future    Standing Expiration Date:   01/07/2020    Requested Prescriptions   Signed Prescriptions Disp Refills  . losartan (COZAAR) 100 MG tablet 30 tablet 1    Sig: Take 1 tablet (100 mg total) by mouth daily.

## 2019-01-09 LAB — TESTOSTERONE, FREE, TOTAL, SHBG
Sex Hormone Binding: 30.1 nmol/L (ref 19.3–76.4)
Testosterone, Free: 5.4 pg/mL — ABNORMAL LOW (ref 7.2–24.0)
Testosterone: 303 ng/dL (ref 264–916)

## 2019-01-27 ENCOUNTER — Encounter: Payer: Self-pay | Admitting: Neurology

## 2019-01-28 ENCOUNTER — Ambulatory Visit (INDEPENDENT_AMBULATORY_CARE_PROVIDER_SITE_OTHER): Payer: BLUE CROSS/BLUE SHIELD | Admitting: Neurology

## 2019-01-28 ENCOUNTER — Encounter: Payer: Self-pay | Admitting: Family

## 2019-01-28 ENCOUNTER — Ambulatory Visit (INDEPENDENT_AMBULATORY_CARE_PROVIDER_SITE_OTHER): Payer: BLUE CROSS/BLUE SHIELD | Admitting: Family

## 2019-01-28 ENCOUNTER — Telehealth: Payer: Self-pay | Admitting: Neurology

## 2019-01-28 ENCOUNTER — Encounter: Payer: Self-pay | Admitting: Neurology

## 2019-01-28 VITALS — BP 144/80 | HR 76 | Temp 98.0°F | Ht 76.0 in | Wt 342.1 lb

## 2019-01-28 VITALS — BP 126/68 | HR 75 | Ht 75.0 in | Wt 344.0 lb

## 2019-01-28 DIAGNOSIS — J3089 Other allergic rhinitis: Secondary | ICD-10-CM | POA: Diagnosis not present

## 2019-01-28 DIAGNOSIS — J019 Acute sinusitis, unspecified: Secondary | ICD-10-CM | POA: Diagnosis not present

## 2019-01-28 DIAGNOSIS — G4733 Obstructive sleep apnea (adult) (pediatric): Secondary | ICD-10-CM

## 2019-01-28 DIAGNOSIS — Z9989 Dependence on other enabling machines and devices: Secondary | ICD-10-CM

## 2019-01-28 MED ORDER — LOSARTAN POTASSIUM 100 MG PO TABS: 100.0000 mg | ORAL_TABLET | Freq: Every day | ORAL | 1 refills | Status: DC

## 2019-01-28 NOTE — Progress Notes (Signed)
SLEEP MEDICINE CLINIC   Provider:  Larey Seat, MD   Primary Care Physician:  Marrian Salvage, [place]   Referring Provider: see above, NP   Chief Complaint  Patient presents with  . New Patient (Initial Visit)    pt alone, rm 11. pt states that he had a sleep study completed several yrs ago. pt was ordered CPAP machine. pt states that he does use the machine sometimes he states that sometimes the machine has too much pre    HPI:  Bobby Mcguire is a 53 y.o. male patient , seen here on 01-28-2019 in a referral for transfer of sleep care-  from NP [facility] who is worried about HTN. The patient did not provide any documentation as to his previous sleep care, but allowed [place] to download his CPAP for compliance.  He lived in [place] / near [place] 20 years ago when he was diagnosed, and got the CPAP machine. He is now on his third machine.   Chief complaint according to patient : He reports morbid obesity, rhinitis, and feels still sleepy.   Sleep habits are as follows:  Dinner time 6-7 Pm, later will snack. Drinks beer to help his sleep.  He goes to the Gym  Before 8 PM on weekends, for one hour, he returns and plays on his computer or watches TV- in the bedroom (!) . By midnight he goes to sleep in the  bedroom that he shared with his wife, he sleeps in a recliner in order to keep his CPAP in place.  Sleep latency is less than 30 minutes, if not he uses a sleep aid. Uses afrin PRN for nasal airflow patency. The bedroom is cool, but not quiet and dark- his bedroom has 3 flex screen TVs and his wife and he both watch.  Rises at variable times, by 8 AM the family is at work and he will try to sleep another hour.   No clue about sleep hygiene, was never addressed.    Sleep medical history: first OSA diagnosis in [place] around 2000. Obesity, rhinitis. HTN, high Cholesterol. Last CPAP issued in 2015.   Family sleep history:  No family member is affected.   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  3 Children 17, 27 and 30.   Review of Systems: Out of a complete 14 system review, the patient complains of only the following symptoms, and all other reviewed systems are negative.   Snoring if not using CPAP,  Cannot sleep well without it.  Last sleep machine ordered by Dr Constance Holster, ENT on [address].   Epworth score  5/ 24 points  , Fatigue severity score 26/63   , depression score N/A    Social History   Socioeconomic History  . Marital status: Single    Spouse name: Not on file  . Number of children: Not on file  . Years of education: Not on file  . Highest education level: Not on file  Occupational History  . Not on file  Social Needs  . Financial resource strain: Not on file  . Food insecurity:    Worry: Not on file    Inability: Not on file  . Transportation needs:    Medical: Not on file    Non-medical: Not on file  Tobacco Use  . Smoking status: Never Smoker  . Smokeless tobacco: Never Used  Substance and Sexual Activity  . Alcohol use:  Yes    Alcohol/week: 3.0 standard drinks    Types: 3 Glasses of wine per week  . Drug use: No  . Sexual activity: Not on file  Lifestyle  . Physical activity:    Days per week: Not on file    Minutes per session: Not on file  . Stress: Not on file  Relationships  . Social connections:    Talks on phone: Not on file    Gets together: Not on file    Attends religious service: Not on file    Active member of club or organization: Not on file    Attends meetings of clubs or organizations: Not on file    Relationship status: Not on file  . Intimate partner violence:    Fear of current or ex partner: Not on file    Emotionally abused: Not on file    Physically abused: Not on file    Forced sexual activity: Not on file  Other Topics Concern  . Not on file  Social History Narrative  . Not on file    Family History  Problem Relation Age of  Onset  . Cancer Mother     Past Medical History:  Diagnosis Date  . Hypertension   . Sleep apnea     No past surgical history on file.  Current Outpatient Medications  Medication Sig Dispense Refill  . amLODipine (NORVASC) 10 MG tablet Take 1 tablet (10 mg total) by mouth daily. 90 tablet 1  . HYDROcodone-acetaminophen (NORCO) 10-325 MG tablet Take 1 tablet by mouth every 8 (eight) hours as needed. 21 tablet 0  . hydrOXYzine (ATARAX/VISTARIL) 25 MG tablet TAKE 1 TABLET THREE TIMES A DAY AS NEEDED 90 tablet 1  . ibuprofen (ADVIL,MOTRIN) 800 MG tablet ibuprofen 800 mg tablet  TAKE 2 TABLETS TWICE A DAY    . Ibuprofen-Famotidine (DUEXIS) 800-26.6 MG TABS Duexis 800 mg-26.6 mg tablet  Take 1 tablet 3 times a day by oral route.    Marland Kitchen losartan (COZAAR) 100 MG tablet Take 1 tablet (100 mg total) by mouth daily. 90 tablet 1  . Multiple Vitamin (MULTIVITAMIN WITH MINERALS) TABS tablet Take 1 tablet by mouth daily.    . Multiple Vitamins-Minerals (AIRBORNE PO) Take 1 packet by mouth as needed (to boost immune health.).     Marland Kitchen neomycin-polymyxin-hydrocortisone (CORTISPORIN) OTIC solution Apply 1-2 drops to toe after soaking BID 10 mL 1  . rosuvastatin (CRESTOR) 10 MG tablet Take 1 tablet (10 mg total) by mouth daily. 90 tablet 0  . sildenafil (VIAGRA) 100 MG tablet TAKE ONE-HALF (1/2) TO ONE TABLET DAILY AS NEEDED FOR ERECTILE DYSFUNCTION 30 tablet 6   No current facility-administered medications for this visit.     Allergies as of 01/28/2019 - Review Complete 01/28/2019  Allergen Reaction Noted  . Other  11/01/2018    Vitals: BP 126/68   Pulse 75   Ht 6\' 3"  (1.905 m)   Wt (!) 344 lb (156 kg)   BMI 43.00 kg/m  Last Weight:  Wt Readings from Last 1 Encounters:  01/28/19 (!) 344 lb (156 kg)   VHQ:IONG mass index is 43 kg/m.     Last Height:   Ht Readings from Last 1 Encounters:  01/28/19 6\' 3"  (1.905 m)    Physical exam:  General: The patient is awake, alert and appears not in  acute distress. The patient is well groomed. Head: Normocephalic, atraumatic. Neck is supple. Mallampati 5 ,  neck circumference: 20". Nasal airflow patent ,  Retrognathia is seen.  Cardiovascular:  Regular rate and rhythm, without  murmurs or carotid bruit, and without distended neck veins. Respiratory: Lungs are clear to auscultation. Skin:  Without evidence of edema, or rash Trunk: BMI is 43.00. The patient's posture is erect.   Neurologic exam : The patient is awake and alert, oriented to place and time.  Attention span & concentration ability appears normal. Speech is fluent,  without dysarthria, dysphonia or aphasia. Mood and affect are appropriate.  Cranial nerves: Pupils are equal and briskly reactive to light. Funduscopic exam without evidence of pallor or edema. Extraocular movements  in vertical and horizontal planes intact and without nystagmus. Visual fields by finger perimetry are intact. Hearing to finger rub intact.  Facial sensation intact to fine touch. Facial motor strength is symmetric and tongue and uvula move midline. Shoulder shrug was symmetrical.   Motor exam: Normal tone, muscle bulk and symmetric strength in all extremities. Sensory:  Fine touch, pinprick and vibration were normal. Coordination: Finger-to-nose maneuver normal without evidence of ataxia, dysmetria or tremor. Gait and station: Patient walks without assistive device. Strength within normal limits. Stance is stable and normal.   Has reportedly knee pain.  Deep tendon reflexes: in the upper and lower extremities are symmetric and intact. Babinski maneuver response is downgoing.    Assessment:  After physical and neurologic examination, review of laboratory studies,  Personal review of imaging studies, reports of other /same  Imaging studies, results of polysomnography and / or neurophysiology testing and pre-existing records as far as provided in visit., my assessment is   1) longstanding OSA diagnosis,  with main risk factor being overweight, alcohol at night, and large neck.  CPAP is almost 53 years old and will be replaced.  Mr. Gunnoe has been 100% compliant CPAP user also these data do not indicate how many hours he truly slept using CPAP the average user time is 8 hours and 14 minutes, his minimum pressure is 4 cmH2O his maximum pressure is 20 cmH2O with 3 cm EPR.  I have no idea who set his machine but there have been no limits on the lower or upper pressure he does have mild to moderate air leakage, he has a residual AHI of only 0.3, he has 3 cm expiratory pressure relief set and his 95th percentile pressure is 9.7.  Based on that I would like to start his minimum pressure at 6 and his maximum pressure should be limited to 15 cm.    I would like to see him back in 6 months when he is due for a new machine. This time he does not endorse a high degree of sleepiness or fatigue, I do think that he could improve his overall wellbeing with a medical weight loss program and with a implementation of some sleep hygiene.   2) co-morbidities: HTN, Obesity, reduced exercise  Due to knee pain. I would strongly recommend a medical weight management referral.   3)  Poor dietary habits and poor sleep hygiene. I gave him the 14 day boot camp.    The patient was advised of the nature of the diagnosed disorder , the treatment options and the  risks for general health and wellness arising from not treating the condition.   I spent more than 45  minutes of face to face time with the patient.  Greater than 50% of time was spent in counseling and coordination of care. We have discussed the diagnosis and differential and I answered the patient's  questions.    Plan:  Treatment plan and additional workup :  Former patient of [facility] in [place], he had no sleep study in the last 12 years, the current machine is not set, and he had no fittings for interfaces.  He had seen ENT Dr Constance Holster.  Rhinitis treated with  afrin, steroids, ENT.    Larey Seat, MD 1/83/3582, 5:18 PM  Certified in Neurology by ABPN Certified in [place] by [facility] [address], [place],  [address]

## 2019-01-28 NOTE — Progress Notes (Signed)
Bobby Mcguire is a 53 y.o. male with the following history as recorded in EpicCare:  Patient Active Problem List   Diagnosis Date Noted  . Osteoarthritis of knee 01/09/2018  . Acute non-recurrent sinusitis 09/19/2017  . Sinus congestion 09/19/2017  . Erectile dysfunction 09/19/2017  . Acute pain of left knee 06/27/2017  . Effusion of knee joint, left 06/27/2017  . Inflammation of joint of left knee 06/22/2017  . Essential hypertension 06/22/2017  . Elevated blood pressure reading 06/06/2017  . Screening for colon cancer 06/06/2017  . Obesity 08/18/2016  . Obstructive sleep apnea 05/07/2010    Current Outpatient Medications  Medication Sig Dispense Refill  . amLODipine (NORVASC) 10 MG tablet Take 1 tablet (10 mg total) by mouth daily. 90 tablet 1  . HYDROcodone-acetaminophen (NORCO) 10-325 MG tablet Take 1 tablet by mouth every 8 (eight) hours as needed. 21 tablet 0  . hydrOXYzine (ATARAX/VISTARIL) 25 MG tablet TAKE 1 TABLET THREE TIMES A DAY AS NEEDED 90 tablet 1  . ibuprofen (ADVIL,MOTRIN) 800 MG tablet ibuprofen 800 mg tablet  TAKE 2 TABLETS TWICE A DAY    . Ibuprofen-Famotidine (DUEXIS) 800-26.6 MG TABS Duexis 800 mg-26.6 mg tablet  Take 1 tablet 3 times a day by oral route.    Marland Kitchen losartan (COZAAR) 100 MG tablet Take 1 tablet (100 mg total) by mouth daily. 90 tablet 1  . Multiple Vitamin (MULTIVITAMIN WITH MINERALS) TABS tablet Take 1 tablet by mouth daily.    . Multiple Vitamins-Minerals (AIRBORNE PO) Take 1 packet by mouth as needed (to boost immune health.).     Marland Kitchen neomycin-polymyxin-hydrocortisone (CORTISPORIN) OTIC solution Apply 1-2 drops to toe after soaking BID 10 mL 1  . rosuvastatin (CRESTOR) 10 MG tablet Take 1 tablet (10 mg total) by mouth daily. 90 tablet 0  . sildenafil (VIAGRA) 100 MG tablet TAKE ONE-HALF (1/2) TO ONE TABLET DAILY AS NEEDED FOR ERECTILE DYSFUNCTION 30 tablet 6   No current facility-administered medications for this visit.     Allergies: Other   Past Medical History:  Diagnosis Date  . Hypertension   . Sleep apnea     History reviewed. No pertinent surgical history.  Family History  Problem Relation Age of Onset  . Cancer Mother     Social History   Tobacco Use  . Smoking status: Never Smoker  . Smokeless tobacco: Never Used  Substance Use Topics  . Alcohol use: Yes    Alcohol/week: 3.0 standard drinks    Types: 3 Glasses of wine per week    Subjective:  3 week follow-up on hypertension; has lost 6 pounds since last OV- trying to eat better, limit sugar intake; excited about improvement seen today; Denies any chest pain, shortness of breath, blurred vision or headache. Is taking both Losartan and Amlodipine as prescribed;     Objective:  Vitals:   01/28/19 1117  BP: (!) 144/80  Pulse: 76  Temp: 98 F (36.7 C)  TempSrc: Oral  SpO2: 97%  Weight: (!) 342 lb 1.3 oz (155.2 kg)  Height: 6\' 4"  (1.93 m)    General: Well developed, well nourished, in no acute distress  Skin : Warm and dry.  Head: Normocephalic and atraumatic  Eyes: Sclera and conjunctiva clear; pupils round and reactive to light; extraocular movements intact  Ears: External normal; canals clear; tympanic membranes normal  Oropharynx: Pink, supple. No suspicious lesions  Neck: Supple without thyromegaly, adenopathy  Lungs: Respirations unlabored; clear to auscultation bilaterally without wheeze, rales, rhonchi  CVS  exam: normal rate and regular rhythm.  Neurologic: Alert and oriented; speech intact; face symmetrical; moves all extremities well; CNII-XII intact without focal deficit   Assessment:  1. Acute sinusitis, recurrence not specified, unspecified location     Plan:  Control improving- still not at goal; will not add more medication at this time- patient wants to work on weight loss goals first; will try to lose another 10 pounds in the next 2 months; continue same medications; follow-up in April 2020.    Return in about 2 months (around  03/29/2019).  No orders of the defined types were placed in this encounter.   Requested Prescriptions   Signed Prescriptions Disp Refills  . losartan (COZAAR) 100 MG tablet 90 tablet 1    Sig: Take 1 tablet (100 mg total) by mouth daily.

## 2019-01-28 NOTE — Telephone Encounter (Signed)
Called the number to [facility] for the patient as this was the DME he was set up and established with. Patient presents as a new patient and in need of pressure change. I have contacted the number that was listed and they have now changed to [facility]. I left a message to get their fax number to send the order for pressure changes for the patient.   LVM for [facility] to contact us back to give [place] fax number to send the order to.

## 2019-02-03 ENCOUNTER — Other Ambulatory Visit: Payer: Self-pay | Admitting: Family

## 2019-02-04 ENCOUNTER — Other Ambulatory Visit: Payer: Self-pay | Admitting: Family

## 2019-02-05 ENCOUNTER — Other Ambulatory Visit: Payer: Self-pay | Admitting: Family

## 2019-02-06 ENCOUNTER — Ambulatory Visit (INDEPENDENT_AMBULATORY_CARE_PROVIDER_SITE_OTHER): Payer: BLUE CROSS/BLUE SHIELD | Admitting: Neurology

## 2019-02-06 DIAGNOSIS — J3089 Other allergic rhinitis: Secondary | ICD-10-CM

## 2019-02-06 DIAGNOSIS — G4733 Obstructive sleep apnea (adult) (pediatric): Secondary | ICD-10-CM | POA: Diagnosis not present

## 2019-02-06 DIAGNOSIS — Z9989 Dependence on other enabling machines and devices: Secondary | ICD-10-CM

## 2019-02-06 MED ORDER — ROSUVASTATIN CALCIUM 10 MG PO TABS: 10.0000 mg | ORAL_TABLET | Freq: Every day | ORAL | 0 refills | Status: DC

## 2019-02-13 ENCOUNTER — Other Ambulatory Visit: Payer: Self-pay | Admitting: Family

## 2019-02-13 DIAGNOSIS — J309 Allergic rhinitis, unspecified: Secondary | ICD-10-CM | POA: Insufficient documentation

## 2019-02-13 NOTE — Addendum Note (Signed)
Addended by: Larey Seat on: 02/13/2019 12:41 PM   Modules accepted: Orders

## 2019-02-13 NOTE — Procedures (Signed)
[facility] [place], [place] [address] NAME:  Bobby Mcguire                                                                             DOB: 10/15/1966 MEDICAL RECORD no:  [id number]                                                       DOS: 02/06/2019   REFERRING PHYSICIAN: Marrian Salvage, FNP STUDY PERFORMED: Home Sleep Test on [facility] HISTORY: [name] is a 53 y.o. male patient and was seen on 01-28-2019 in a referral for transfer of sleep care- from NP [facility], who has been worried about the patient's HTN. The patient could not provide any documentation as to his previous sleep care, but allowed [place] to download his CPAP for compliance. He lived in [place] / near [place] 20 years ago when he was diagnosed, and got the CPAP machine. He is now on his third machine, ordered in 2015 by ENT Dr. Constance Holster.  Chief complaint: He reports morbid obesity, rhinitis, and feels still daytime sleepy. Can't sleep without CPAP.    Epworth Sleepiness score; on CPAP 05/ 24 points; Fatigue severity score 26/63;   BMI:43.2  STUDY RESULTS:  Total Recording Time: 7 h 8 mins; Calculated Sleep Time:  5 h 53 mins Total Apnea/Hypopnea Index (AHI): 63.7 /h; RDI: 64.3 /h; REM AHI: 60.9 /h Average Oxygen Saturation:   94 %; Lowest Oxygen Desaturation:  83 %  Total Time Oxygen Saturation Below or at 88 %:  2.6 minutes  Average Heart Rate:  78 bpm (between 48 and 100 bpm) IMPRESSION: Severe Sleep Apnea, moderate snoring, no REM sleep accentuation. Minimal hypoxemia.  RECOMMENDATION: Continuation of CPAP care. If the patient's machine is now 53 years old, he would be able to obtain a new one that has autotitration capability and shall be set between 5-18 cm water with 3 cm EPR. Mask of patient's choice and comfort.  I certify that I have reviewed the raw data recording prior to the issuance of this report in accordance with the standards of the American Academy of  Sleep Medicine (AASM). Larey Seat, M.D.    02-13-2019   Medical Director of [facility] at [facility], accredited by the AASM. Diplomat of the ABPN and ABSM.

## 2019-02-14 ENCOUNTER — Telehealth: Payer: Self-pay | Admitting: Neurology

## 2019-02-14 ENCOUNTER — Encounter: Payer: Self-pay | Admitting: Neurology

## 2019-02-14 NOTE — Telephone Encounter (Signed)
Called patient to discuss sleep study results. No answer at this time. LVM for the patient to call back. I will also send a mychart message.   

## 2019-02-14 NOTE — Telephone Encounter (Signed)
-----   Message from Larey Seat, MD sent at 02/13/2019 12:41 PM EST ----- IMPRESSION: Severe Sleep Apnea, moderate snoring, no REM sleep  accentuation. Minimal hypoxemia.  RECOMMENDATION: Continuation of CPAP care. If the patient's  machine is now 53 years old, he would be able to obtain a new one  that has autotitration capability and shall be set between 5-18  cm water with 3 cm EPR. Mask of patient's choice and comfort.

## 2019-02-18 ENCOUNTER — Encounter: Payer: Self-pay | Admitting: Neurology

## 2019-02-18 NOTE — Telephone Encounter (Signed)
Called the pt for 3rd attempt. No answer. Will attempt mailing a letter for the pt to contact us.

## 2019-02-18 NOTE — Telephone Encounter (Signed)
Pt called back and I was able to review with him his sleep study results. I will send the orders and information over to [facility] to see if we can get him set up with them. Pt will need a initial cpap 31-90 day follow up once he is set up on a new machine.

## 2019-02-20 ENCOUNTER — Ambulatory Visit (INDEPENDENT_AMBULATORY_CARE_PROVIDER_SITE_OTHER): Payer: BLUE CROSS/BLUE SHIELD | Admitting: Family Medicine

## 2019-02-20 ENCOUNTER — Encounter: Payer: Self-pay | Admitting: Family Medicine

## 2019-02-20 VITALS — BP 138/70 | HR 74 | Temp 98.0°F | Resp 16 | Ht 76.0 in | Wt 341.0 lb

## 2019-02-20 DIAGNOSIS — R600 Localized edema: Secondary | ICD-10-CM

## 2019-02-20 DIAGNOSIS — R609 Edema, unspecified: Secondary | ICD-10-CM

## 2019-02-20 NOTE — Progress Notes (Signed)
Established Patient Office Visit  Subjective:  Patient ID: Bobby Mcguire, male    DOB: [birth year]  Age: 53 y.o. MRN: [id number]  CC:  Chief Complaint  Patient presents with  . Mass    pt states he noticed the lump on the right side of his face when he eats x 2 days     HPI Bobby Mcguire presents for   Pt noticed 2 days ago that when he eats there is a swelling that pops up toward the right jaw He denies fevers or chills or pain with swallowing He reports that once he starts to eat it becomes noticeable and gets largest the hour during and after eating. Started out of the blue. He ate some spicy foods before come in to the office today  Past Medical History:  Diagnosis Date  . Hypertension   . Sleep apnea     No past surgical history on file.  Family History  Problem Relation Age of Onset  . Cancer Mother     Social History   Socioeconomic History  . Marital status: Single    Spouse name: Not on file  . Number of children: Not on file  . Years of education: Not on file  . Highest education level: Not on file  Occupational History  . Not on file  Social Needs  . Financial resource strain: Not on file  . Food insecurity:    Worry: Not on file    Inability: Not on file  . Transportation needs:    Medical: Not on file    Non-medical: Not on file  Tobacco Use  . Smoking status: Never Smoker  . Smokeless tobacco: Never Used  Substance and Sexual Activity  . Alcohol use: Yes    Alcohol/week: 3.0 standard drinks    Types: 3 Glasses of wine per week  . Drug use: No  . Sexual activity: Not on file  Lifestyle  . Physical activity:    Days per week: Not on file    Minutes per session: Not on file  . Stress: Not on file  Relationships  . Social connections:    Talks on phone: Not on file    Gets together: Not on file    Attends religious service: Not on file    Active member of club or organization: Not on file    Attends meetings of clubs or  organizations: Not on file    Relationship status: Not on file  . Intimate partner violence:    Fear of current or ex partner: Not on file    Emotionally abused: Not on file    Physically abused: Not on file    Forced sexual activity: Not on file  Other Topics Concern  . Not on file  Social History Narrative  . Not on file    Outpatient Medications Prior to Visit  Medication Sig Dispense Refill  . amLODipine (NORVASC) 10 MG tablet Take 1 tablet (10 mg total) by mouth daily. 90 tablet 1  . HYDROcodone-acetaminophen (NORCO) 10-325 MG tablet Take 1 tablet by mouth every 8 (eight) hours as needed. 21 tablet 0  . hydrOXYzine (ATARAX/VISTARIL) 25 MG tablet TAKE 1 TABLET THREE TIMES A DAY AS NEEDED 90 tablet 1  . ibuprofen (ADVIL,MOTRIN) 800 MG tablet ibuprofen 800 mg tablet  TAKE 2 TABLETS TWICE A DAY    . Ibuprofen-Famotidine (DUEXIS) 800-26.6 MG TABS Duexis 800 mg-26.6 mg tablet  Take 1 tablet 3 times a day by oral  route.    . losartan (COZAAR) 100 MG tablet Take 1 tablet (100 mg total) by mouth daily. 90 tablet 1  . Multiple Vitamin (MULTIVITAMIN WITH MINERALS) TABS tablet Take 1 tablet by mouth daily.    . Multiple Vitamins-Minerals (AIRBORNE PO) Take 1 packet by mouth as needed (to boost immune health.).     Marland Kitchen neomycin-polymyxin-hydrocortisone (CORTISPORIN) OTIC solution Apply 1-2 drops to toe after soaking BID 10 mL 1  . rosuvastatin (CRESTOR) 10 MG tablet Take 1 tablet (10 mg total) by mouth daily. 90 tablet 0  . sildenafil (VIAGRA) 100 MG tablet TAKE ONE-HALF (1/2) TO ONE TABLET DAILY AS NEEDED FOR ERECTILE DYSFUNCTION 30 tablet 6   No facility-administered medications prior to visit.     Allergies  Allergen Reactions  . Other     ROS Review of Systems See hpi   Objective:    Physical Exam  BP 138/70   Pulse 74   Temp 98 F (36.7 C)   Resp 16   Ht 6\' 4"  (1.93 m)   Wt (!) 341 lb (154.7 kg)   SpO2 99%   BMI 41.51 kg/m  Wt Readings from Last 3 Encounters:    02/20/19 (!) 341 lb (154.7 kg)  01/28/19 (!) 344 lb (156 kg)  01/28/19 (!) 342 lb 1.3 oz (155.2 kg)   General: alert, oriented, in NAD Head: normocephalic, atraumatic, no sinus tenderness Eyes: EOM intact, no scleral icterus or conjunctival injection Ears: TM clear bilaterally Mouth: right buccal area with tension noted  No TMJ clicking or tenderness Nose: mucosa nonerythematous, nonedematous Throat: no pharyngeal exudate or erythema Lymph: no posterior auricular, submental or cervical lymph adenopathy Heart: normal rate, normal sinus rhythm, no murmurs Lungs: clear to auscultation bilaterally, no wheezing    Health Maintenance Due  Topic Date Due  . HIV Screening  05/31/1981    There are no preventive care reminders to display for this patient.  Lab Results  Component Value Date   TSH 1.23 04/17/2016   Lab Results  Component Value Date   WBC 9.0 11/05/2018   HGB 14.7 11/05/2018   HCT 43.7 11/05/2018   MCV 99.7 11/05/2018   PLT 301.0 11/05/2018   Lab Results  Component Value Date   NA 140 01/07/2019   K 4.2 01/07/2019   CO2 25 01/07/2019   GLUCOSE 105 (H) 01/07/2019   BUN 13 01/07/2019   CREATININE 1.05 01/07/2019   BILITOT 0.4 01/07/2019   ALKPHOS 69 01/07/2019   AST 18 01/07/2019   ALT 27 01/07/2019   PROT 7.8 01/07/2019   ALBUMIN 4.8 01/07/2019   CALCIUM 10.2 01/07/2019   ANIONGAP 11 08/05/2018   GFR 89.54 01/07/2019   Lab Results  Component Value Date   CHOL 155 01/07/2019   Lab Results  Component Value Date   HDL 32.40 (L) 01/07/2019   Lab Results  Component Value Date   LDLCALC 116 (H) 06/22/2017   Lab Results  Component Value Date   TRIG 403.0 (H) 01/07/2019   Lab Results  Component Value Date   CHOLHDL 5 01/07/2019   Lab Results  Component Value Date   HGBA1C 5.0 11/05/2018      Assessment & Plan:   Problem List Items Addressed This Visit    None    Visit Diagnoses    Salivary gland swelling    -  Primary   Relevant  Orders   US Soft Tissue Head/Neck     Discussed that he should use a sour  candy to dislodge the blocked salivary gland If this does not help to resolve it he should get an ultrasound for salivary duct stone   No orders of the defined types were placed in this encounter.   Follow-up: Return if symptoms worsen or fail to improve.    Forrest Moron, MD

## 2019-02-20 NOTE — Patient Instructions (Addendum)
[facility] [place] [address] [id number]) [phone]     If you have lab work done today you will be contacted with your lab results within the next 2 weeks.  If you have not heard from [place] then please contact us. The fastest way to get your results is to register for My Chart.   IF you received an x-ray today, you will receive an invoice from [facility]. Please contact [facility] at [phone] with questions or concerns regarding your invoice.   IF you received labwork today, you will receive an invoice from [facility]. Please contact [facility] at [phone] with questions or concerns regarding your invoice.   Our billing staff will not be able to assist you with questions regarding bills from these companies.  You will be contacted with the lab results as soon as they are available. The fastest way to get your results is to activate your My Chart account. Instructions are located on the last page of this paperwork. If you have not heard from [place] regarding the results in 2 weeks, please contact this office.      Salivary Stone  A salivary stone is a mineral deposit that builds up in the ducts that drain your salivary glands. Most salivary stones are made of calcium. When a stone forms, saliva can back up into the gland and cause painful swelling. Your salivary glands are the glands that produce saliva. You have six major salivary glands. Each gland has a duct that carries saliva into your mouth. Saliva keeps your mouth moist and breaks down the food that you eat. It also helps prevent tooth decay. Two salivary glands are located just in front of your ears (parotid). The ducts for these glands open up inside your cheeks, near your back teeth. You also have two glands under your tongue (sublingual) and two glands under your jaw (submandibular). The ducts for these glands open under your tongue. A stone can form in any salivary gland. The most common place for a salivary stone to  develop is in a submandibular salivary gland. What are the causes? Salivary stones may be caused by any condition that reduces the flow of saliva. It is not known why some people form stones. What increases the risk? You are more likely to develop this condition if:  You are male.  You do not drink enough water.  You smoke.  You have any of the following: ? High blood pressure. ? Gout. ? Diabetes. What are the signs or symptoms? The main sign of a salivary gland stone is sudden swelling of a salivary gland when eating. This usually happens under the jaw on one side. Other signs and symptoms may include:  Swelling of the cheek or under the tongue when eating.  Pain in the swollen area.  Trouble chewing or swallowing.  Swelling that goes down after eating. How is this diagnosed? This condition may be diagnosed based on:  Your signs and symptoms.  A physical exam. In many cases, your health care provider will be able to feel the stone in a duct inside your mouth.  Imaging studies, such as: ? X-rays. ? Ultrasound. ? CT scan. ? MRI. You may need to see an ear, nose, and throat specialist (ENT or otolaryngologist) for diagnosis and treatment. How is this treated? Treatment for this condition depends on the size of the stone.  A small stone that is not causing symptoms may be treated with home care.  For a stone that is large  enough to cause symptoms, the treatment options may include: ? Probing and widening of the duct to allow the stone to pass. ? Inserting a thin, flexible scope (endoscope) into the duct to locate and remove the stone. ? Breaking up the stone with sound waves. ? Removing the entire salivary gland. Follow these instructions at home:  To relieve discomfort  Follow these instructions every few hours: ? Suck on a lemon candy to stimulate the flow of saliva. ? Put a warm compress over the gland. ? Gently massage the gland. General instructions  Drink  enough fluid to keep your urine pale yellow.  Do not use any products that contain nicotine or tobacco, such as cigarettes and e-cigarettes. If you need help quitting, ask your health care provider.  Keep all follow-up visits as told by your health care provider. This is important. Contact a health care provider if:  You have pain and swelling in your face, jaw, or mouth after eating.  You have persistent swelling in any of these places: ? In front of your ear. ? Under your jaw. ? Inside your mouth. Get help right away if:  You have pain and swelling in your face, jaw, or mouth, and this is getting worse.  Your pain and swelling make it hard to swallow or breathe. Summary  A salivary stone is a mineral deposit that builds up in the ducts that drain your salivary glands.  When a stone forms, saliva can back up into the gland and cause painful swelling.  Salivary stones may be caused by any condition that reduces the flow of saliva.  Treatment for this condition depends on the size of the stone. This information is not intended to replace advice given to you by your health care provider. Make sure you discuss any questions you have with your health care provider. Document Released: 01/12/2005 Document Revised: 01/15/2018 Document Reviewed: 01/15/2018 Elsevier Interactive Patient Education  2019 [employer].

## 2019-02-21 ENCOUNTER — Ambulatory Visit
Admission: RE | Admit: 2019-02-21 | Discharge: 2019-02-21 | Disposition: A | Discharge status: Home / Self Care | Payer: BLUE CROSS/BLUE SHIELD | Source: Ambulatory Visit | Attending: Family Medicine | Admitting: Family Medicine

## 2019-02-21 DIAGNOSIS — R609 Edema, unspecified: Principal | ICD-10-CM

## 2019-02-21 DIAGNOSIS — K118 Other diseases of salivary glands: Secondary | ICD-10-CM | POA: Diagnosis not present

## 2019-02-21 DIAGNOSIS — R600 Localized edema: Secondary | ICD-10-CM

## 2019-02-27 DIAGNOSIS — G4733 Obstructive sleep apnea (adult) (pediatric): Secondary | ICD-10-CM | POA: Diagnosis not present

## 2019-03-30 DIAGNOSIS — G4733 Obstructive sleep apnea (adult) (pediatric): Secondary | ICD-10-CM | POA: Diagnosis not present

## 2019-04-01 ENCOUNTER — Ambulatory Visit: Payer: BLUE CROSS/BLUE SHIELD | Admitting: Family

## 2019-04-01 ENCOUNTER — Other Ambulatory Visit: Payer: Self-pay | Admitting: Family

## 2019-04-02 ENCOUNTER — Telehealth: Payer: Self-pay | Admitting: Emergency Medicine

## 2019-04-02 NOTE — Telephone Encounter (Signed)
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 Put in [place] box to be filled out FR

## 2019-04-02 NOTE — Telephone Encounter (Signed)
Pt dropped off papwerwork to be filled put by provider put in Provider box, FR

## 2019-04-03 ENCOUNTER — Other Ambulatory Visit: Payer: Self-pay | Admitting: Emergency Medicine

## 2019-04-03 ENCOUNTER — Other Ambulatory Visit: Payer: Self-pay | Admitting: Family

## 2019-04-03 DIAGNOSIS — N529 Male erectile dysfunction, unspecified: Secondary | ICD-10-CM

## 2019-04-03 NOTE — Telephone Encounter (Signed)
Looks like papers are in your box to be completed.

## 2019-04-03 NOTE — Telephone Encounter (Signed)
Pt is no longer seeing Dr Mitchel Honour and has upcoming appt with Jodi Mourning NP Routing back to office

## 2019-04-16 ENCOUNTER — Other Ambulatory Visit: Payer: Self-pay | Admitting: Family

## 2019-04-25 IMAGING — DX DG KNEE COMPLETE 4+V*L*
4 series · 4 of 4 positions shown · non-contrast
Comparison: RIGHT knee radiograph February 10, 2017

CLINICAL DATA: Acute LEFT knee pain.

EXAM:
LEFT KNEE - COMPLETE 4+ VIEW

[knee ap]
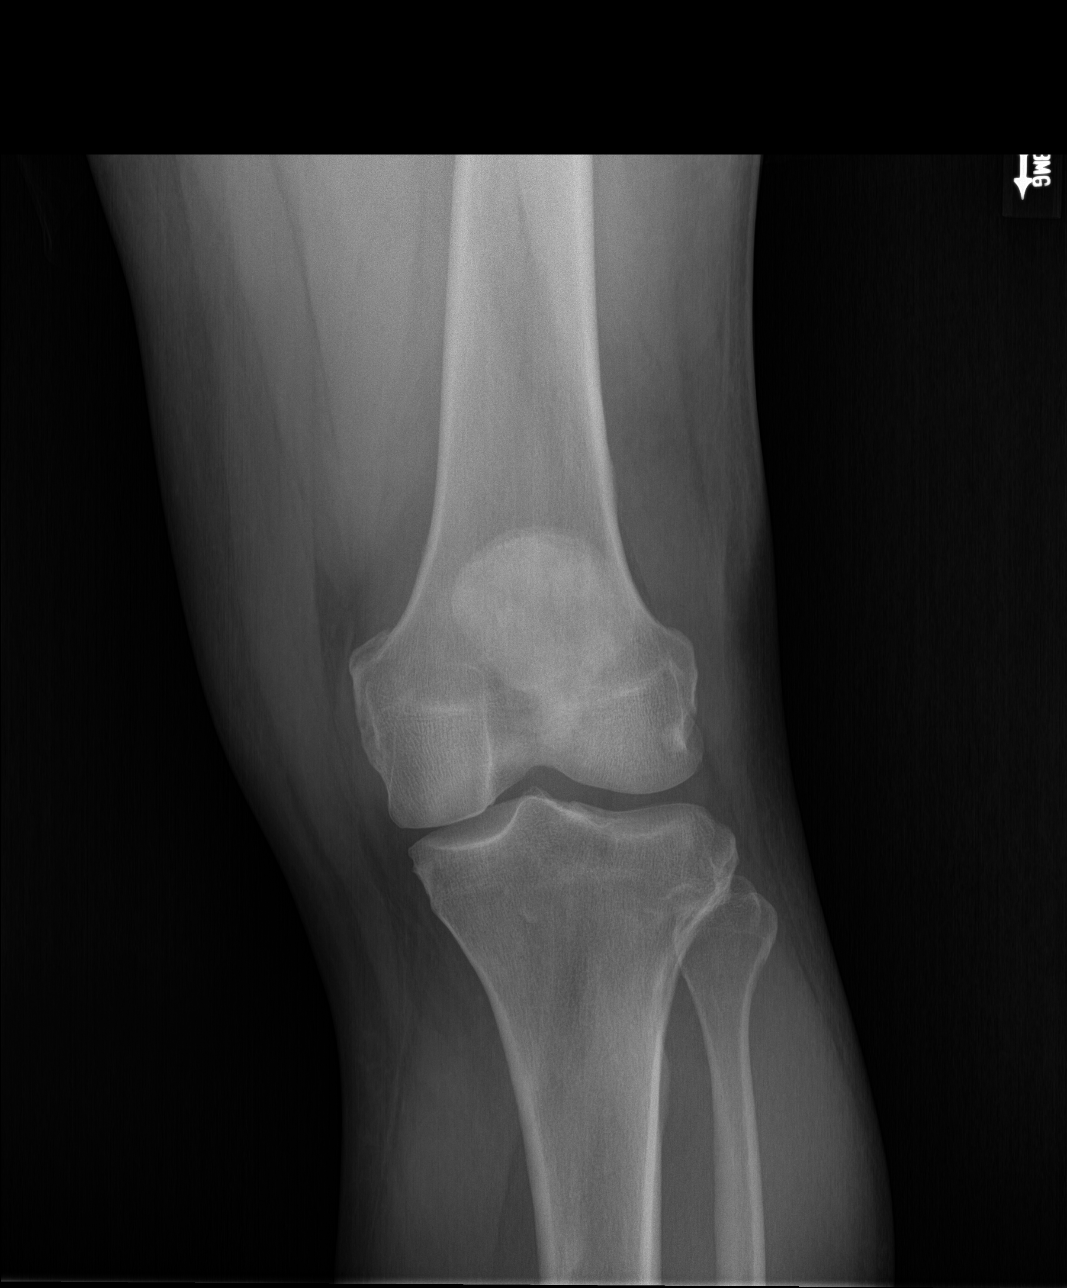

[knee lat]
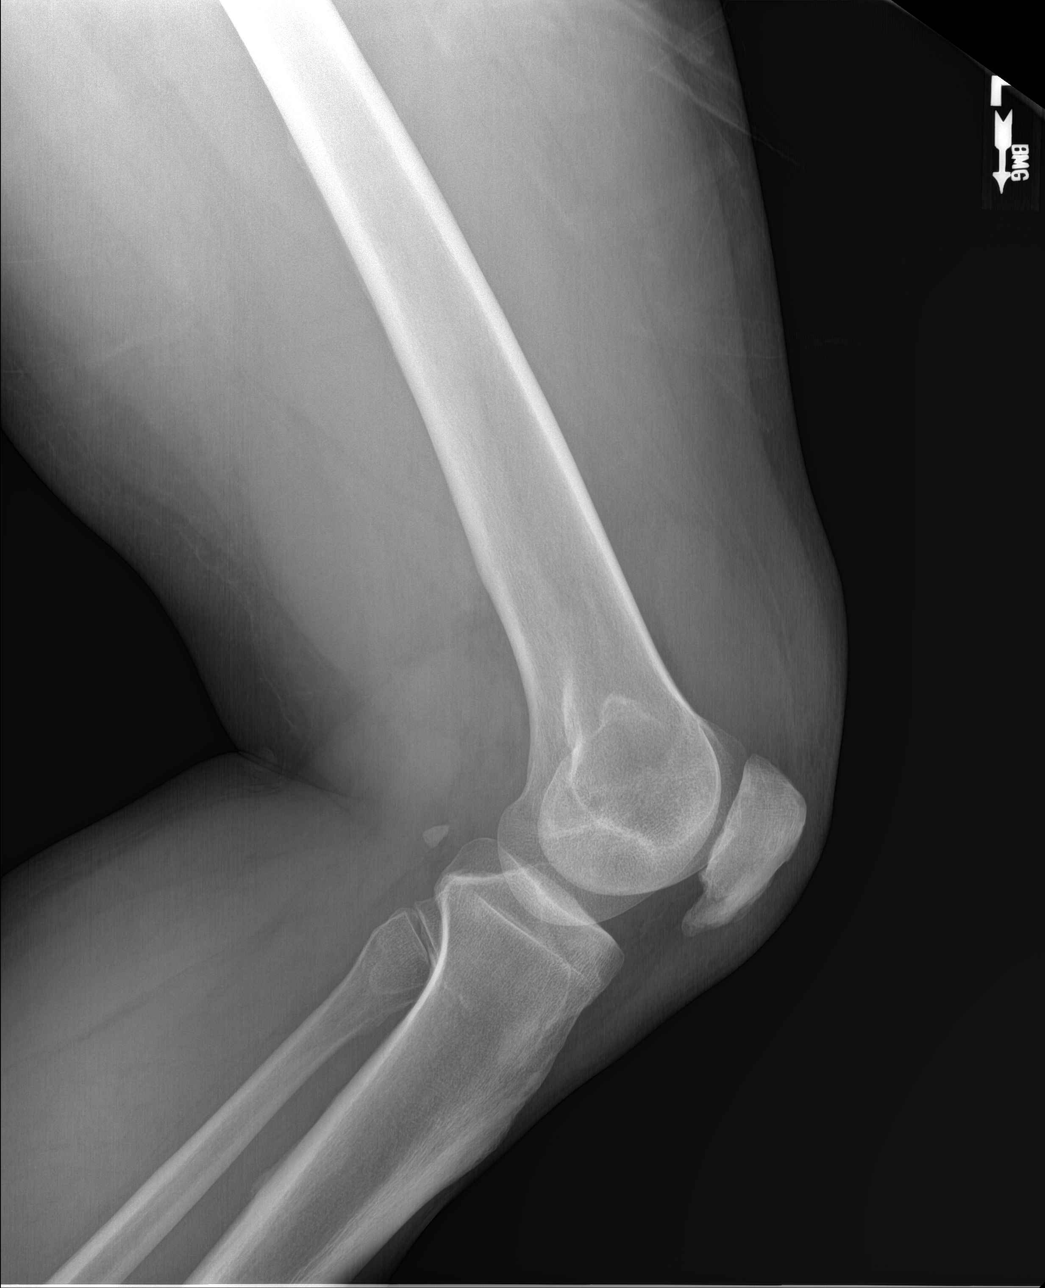

[sunrise]
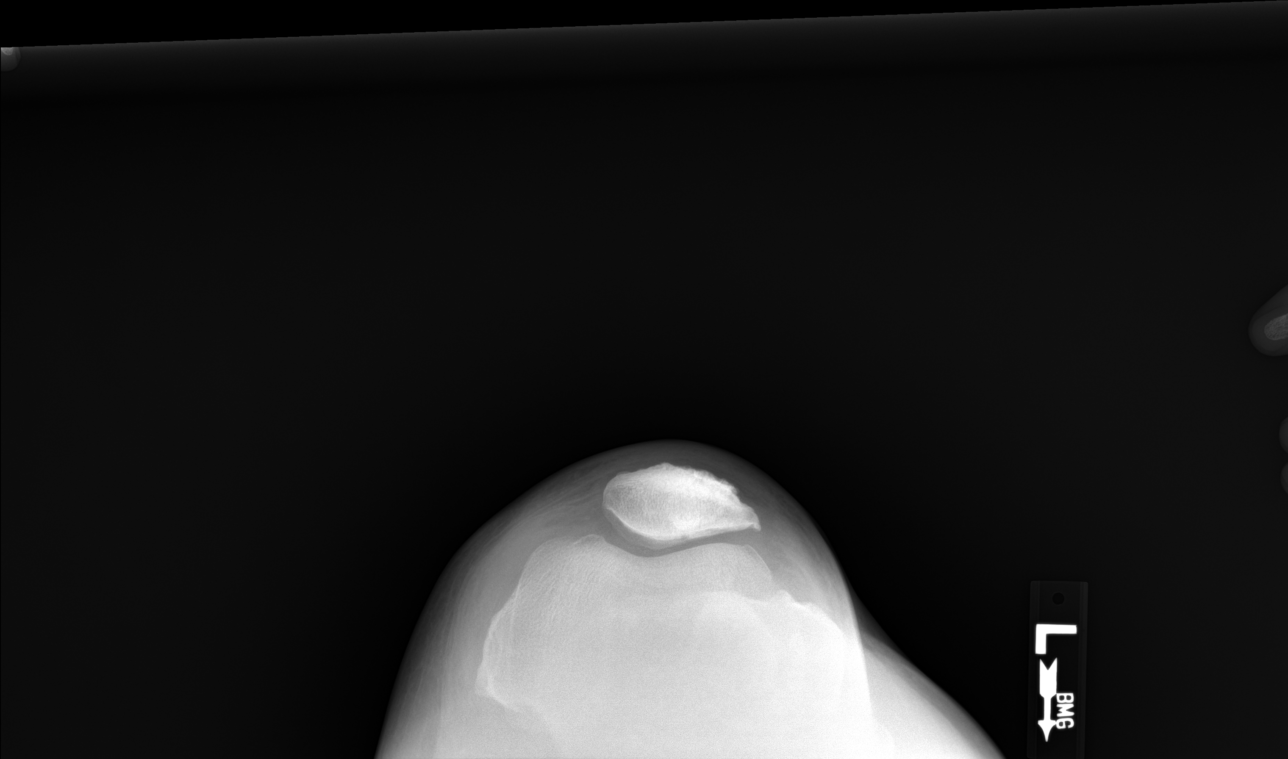

[knee [person_name]]
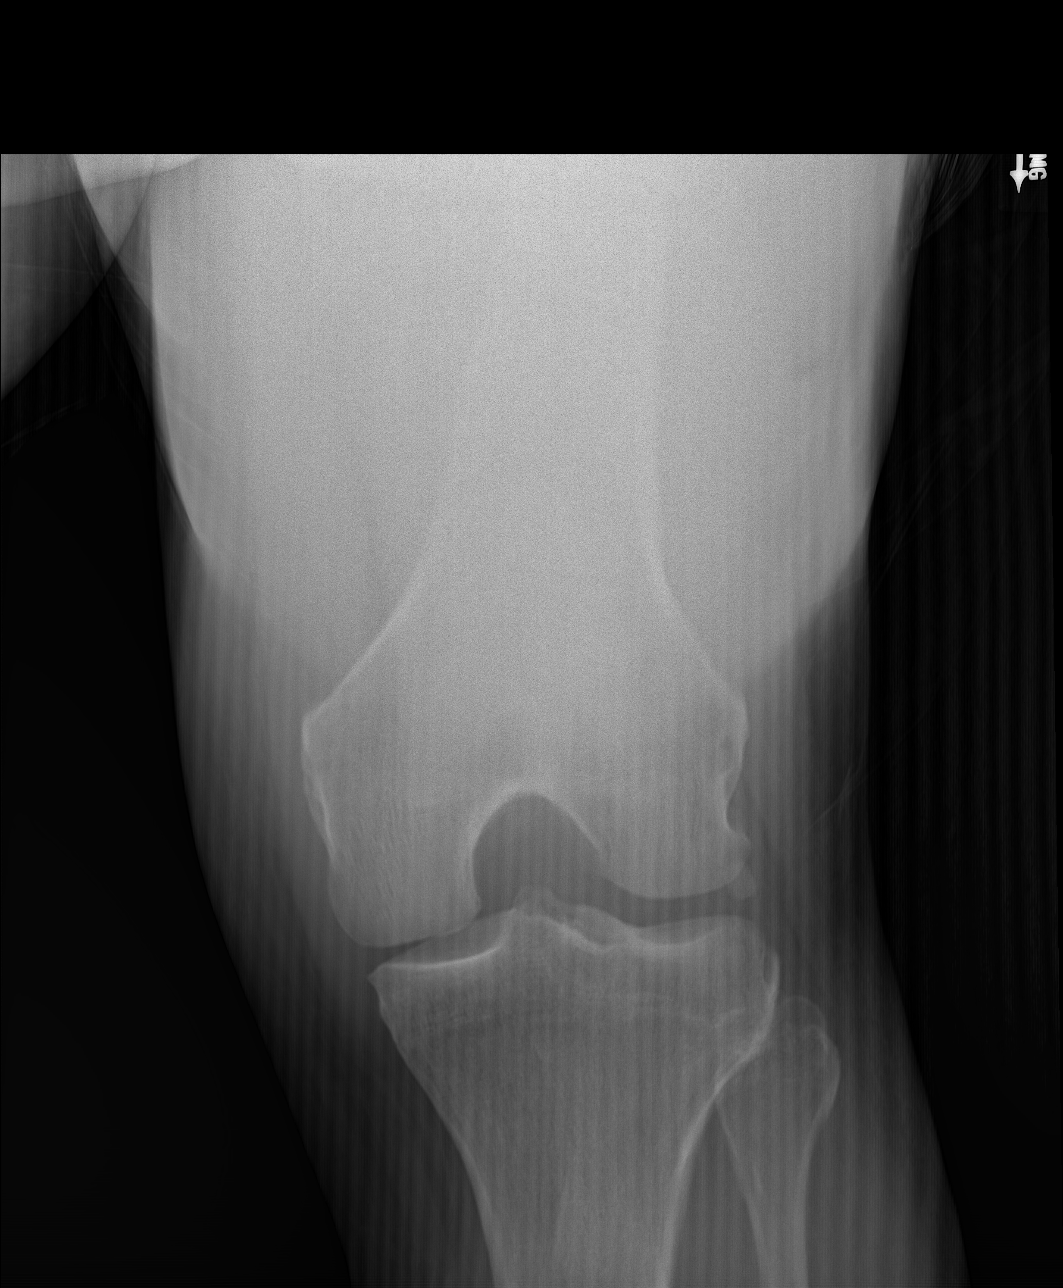

[4 of 4 positions shown; findings below may reference images not displayed]

FINDINGS: No acute fracture for dislocation. Moderate patellofemoral
compartment narrowing with undersurface spurring. Mild medial
compartment narrowing with marginal spurring. No destructive bony
lesions. Moderate suprapatellar joint effusion.
IMPRESSION: Moderate suprapatellar joint effusion without acute osseous process.

Moderate patellofemoral and mild medial compartment osteoarthrosis.

## 2019-04-29 DIAGNOSIS — G4733 Obstructive sleep apnea (adult) (pediatric): Secondary | ICD-10-CM | POA: Diagnosis not present

## 2019-05-02 ENCOUNTER — Telehealth: Payer: Self-pay

## 2019-05-02 NOTE — Telephone Encounter (Signed)
Unable to get in contact with the patient to convert their office visit with Amy on 05/07/2019 into a doxy.me visit. I left a voicemail asking the patient to return my call. Office number was provided.   If patient calls back please convert their office visit into a doxy.me visit.

## 2019-05-07 ENCOUNTER — Ambulatory Visit: Payer: BLUE CROSS/BLUE SHIELD | Admitting: Family Medicine

## 2019-05-16 ENCOUNTER — Ambulatory Visit (INDEPENDENT_AMBULATORY_CARE_PROVIDER_SITE_OTHER): Payer: BLUE CROSS/BLUE SHIELD | Admitting: Podiatry

## 2019-05-16 ENCOUNTER — Other Ambulatory Visit: Payer: Self-pay

## 2019-05-16 ENCOUNTER — Encounter: Payer: Self-pay | Admitting: Podiatry

## 2019-05-16 VITALS — Temp 97.9°F

## 2019-05-16 DIAGNOSIS — L600 Ingrowing nail: Secondary | ICD-10-CM

## 2019-05-16 DIAGNOSIS — R200 Anesthesia of skin: Secondary | ICD-10-CM

## 2019-05-16 DIAGNOSIS — R208 Other disturbances of skin sensation: Secondary | ICD-10-CM

## 2019-05-16 NOTE — Progress Notes (Signed)
Subjective:   Patient ID: Bobby Mcguire, male   DOB: 53 y.o.   MRN: [id number]   HPI Patient states he has had some swelling in his lower legs and feet that he wanted to get checked and he knows he needs to have these nails removed from his left foot.  States the one to the right of very well but it took some time to heal   ROS      Objective:  Physical Exam  Neurovascular status intact with patient found to have good digital perfusion was noted to have thickening dystrophic nails 2345 on the left foot that are painful.  The right look excellent with no nail regrowth and his swelling is mild with negative Homans sign noted     Assessment:  Damaged second third fourth and fifth nails left and swelling which is probably more due to low-grade venous disease with obesity is complicating factor     Plan:  Reviewed the importance of a low-sodium diet elevation compression and he will see his family doctor for consideration for fluid pill which she has taken in the past.  Reviewed removal of the nails left and he wants to get them done and is scheduled to have them done per his schedule

## 2019-05-17 ENCOUNTER — Encounter: Payer: Self-pay | Admitting: Podiatry

## 2019-05-17 ENCOUNTER — Ambulatory Visit (INDEPENDENT_AMBULATORY_CARE_PROVIDER_SITE_OTHER): Payer: BLUE CROSS/BLUE SHIELD | Admitting: Podiatry

## 2019-05-17 ENCOUNTER — Other Ambulatory Visit: Payer: Self-pay

## 2019-05-17 VITALS — Temp 98.2°F

## 2019-05-17 DIAGNOSIS — L600 Ingrowing nail: Secondary | ICD-10-CM | POA: Diagnosis not present

## 2019-05-17 MED ORDER — NEOMYCIN-POLYMYXIN-HC 1 % OT SOLN: OTIC | 1 refills | Status: DC

## 2019-05-17 MED ORDER — OXYCODONE-ACETAMINOPHEN 10-325 MG PO TABS: 1.0000 | ORAL_TABLET | ORAL | 0 refills | Status: DC | PRN

## 2019-05-17 NOTE — Progress Notes (Signed)
Subjective:   Patient ID: Bobby Mcguire, male   DOB: 53 y.o.   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  Today I anesthetized the left foot with 240 mg Xylocaine Marcaine mixture and did sterile prep of digits 2345 and then did a compression dressing to the left second toe exposed the toe and using sterile instrumentation remove the nail to expose the root and applied phenol for applications 30 seconds followed by alcohol lavage sterile dressing.  I then repeated this on the third nail I then repeated this on the fourth nail and I then repeated this on the fifth nail.  Sterile dressings applied to all toes I gave instructions for Percocet which was prescribed with prescription sent to the patient's pharmacy and also drops.  Patient will begin soaks tomorrow but will take dressings off early if throbbing were to occur and have surgical shoe dispensed and was instructed to call with any questions concerns he may half

## 2019-05-17 NOTE — Patient Instructions (Signed)

## 2019-05-21 DIAGNOSIS — G4733 Obstructive sleep apnea (adult) (pediatric): Secondary | ICD-10-CM | POA: Diagnosis not present

## 2019-05-23 ENCOUNTER — Ambulatory Visit (INDEPENDENT_AMBULATORY_CARE_PROVIDER_SITE_OTHER): Payer: BC Managed Care – PPO | Admitting: Emergency Medicine

## 2019-05-23 ENCOUNTER — Other Ambulatory Visit: Payer: Self-pay

## 2019-05-23 ENCOUNTER — Encounter: Payer: Self-pay | Admitting: Emergency Medicine

## 2019-05-23 VITALS — BP 142/64 | HR 90 | Temp 98.9°F | Resp 18 | Ht 76.0 in | Wt 342.0 lb

## 2019-05-23 DIAGNOSIS — R600 Localized edema: Secondary | ICD-10-CM | POA: Diagnosis not present

## 2019-05-23 DIAGNOSIS — I10 Essential (primary) hypertension: Secondary | ICD-10-CM

## 2019-05-23 DIAGNOSIS — E785 Hyperlipidemia, unspecified: Secondary | ICD-10-CM | POA: Diagnosis not present

## 2019-05-23 DIAGNOSIS — R399 Unspecified symptoms and signs involving the genitourinary system: Secondary | ICD-10-CM | POA: Diagnosis not present

## 2019-05-23 LAB — LIPID PANEL

## 2019-05-23 MED ORDER — ROSUVASTATIN CALCIUM 20 MG PO TABS: 20.0000 mg | ORAL_TABLET | Freq: Every day | ORAL | 3 refills | Status: DC

## 2019-05-23 MED ORDER — FUROSEMIDE 20 MG PO TABS: 20.0000 mg | ORAL_TABLET | Freq: Every day | ORAL | 0 refills | Status: DC

## 2019-05-23 NOTE — Progress Notes (Signed)
BP Readings from Last 3 Encounters:  02/20/19 138/70  01/28/19 126/68  01/28/19 (!) 144/80   Lab Results  Component Value Date   CHOL 155 01/07/2019   HDL 32.40 (L) 01/07/2019   LDLCALC 116 (H) 06/22/2017   LDLDIRECT 66.0 01/07/2019   TRIG 403.0 (H) 01/07/2019   CHOLHDL 5 01/07/2019   The 10-year ASCVD risk score Mikey Bussing DC Jr., et al., 2013) is: 11.6%   Values used to calculate the score:     Age: 53 years     Sex: Male     Is Non-Hispanic African American: Yes     Diabetic: No     Tobacco smoker: No     Systolic Blood Pressure: 220 mmHg     Is BP treated: Yes     HDL Cholesterol: 32.4 mg/dL     Total Cholesterol: 155 mg/dL Lab Results  Component Value Date   HGBA1C 5.0 11/05/2018   Lab Results  Component Value Date   CREATININE 1.05 01/07/2019   BUN 13 01/07/2019   NA 140 01/07/2019   K 4.2 01/07/2019   CL 104 01/07/2019   CO2 25 01/07/2019   Bobby Mcguire 53 y.o.   Chief Complaint  Patient presents with  . Hypertension    follow up  and cholesterol  . Leg Swelling    RIGHT x 2 weeks    HISTORY OF PRESENT ILLNESS: This is a 53 y.o. male here for follow-up of hypertension and high cholesterol.  Also complaining of 2-week history of bilateral leg swelling.  Denies pain.  Denies difficulty breathing or chest pain.  Denies flulike symptoms, fever or chills.  Was seen a different primary care physician the past several months.  At some point he was started on Crestor but he is not taking it at the present time.  Taking amlodipine and losartan for blood pressure.  HPI   Prior to Admission medications   Medication Sig Start Date End Date Taking? Authorizing Provider  amLODipine (NORVASC) 10 MG tablet TAKE 1 TABLET DAILY 04/16/19  Yes Marrian Salvage, FNP  hydrOXYzine (ATARAX/VISTARIL) 25 MG tablet TAKE 1 TABLET THREE TIMES A DAY AS NEEDED 02/06/19  Yes Marrian Salvage, FNP  ibuprofen (ADVIL,MOTRIN) 800 MG tablet ibuprofen 800 mg tablet  TAKE 2  TABLETS TWICE A DAY   Yes [provider]  losartan (COZAAR) 100 MG tablet Take 1 tablet (100 mg total) by mouth daily. 01/28/19  Yes Marrian Salvage, FNP  Multiple Vitamin (MULTIVITAMIN WITH MINERALS) TABS tablet Take 1 tablet by mouth daily.   Yes [provider]  NEOMYCIN-POLYMYXIN-HYDROCORTISONE (CORTISPORIN) 1 % SOLN OTIC solution Apply 1-2 drops to toe BID after soaking 05/17/19  Yes Regal, Tamala Fothergill, DPM  sildenafil (VIAGRA) 100 MG tablet TAKE ONE-HALF (1/2) TO ONE TABLET DAILY AS NEEDED FOR ERECTILE DYSFUNCTION 04/03/19  Yes Marrian Salvage, FNP  furosemide (LASIX) 20 MG tablet Take 1 tablet (20 mg total) by mouth daily for 7 days. 05/23/19 05/30/19  [name], MD  Ibuprofen-Famotidine (DUEXIS) 800-26.6 MG TABS Duexis 800 mg-26.6 mg tablet  Take 1 tablet 3 times a day by oral route.    [provider]  oxyCODONE-acetaminophen (PERCOCET) 10-325 MG tablet Take 1 tablet by mouth every 4 (four) hours as needed for pain. Patient not taking: Reported on 05/23/2019 05/17/19   Wallene Huh, DPM  rosuvastatin (CRESTOR) 20 MG tablet Take 1 tablet (20 mg total) by mouth daily. 05/23/19   [name], MD  Allergies  Allergen Reactions  . Other     Patient Active Problem List   Diagnosis Date Noted  . Morbid obesity with body mass index (BMI) of 40.0 to 49.9 ([place]) 02/13/2019  . Osteoarthritis of knee 01/09/2018  . Erectile dysfunction 09/19/2017  . Essential hypertension 06/22/2017  . Severe obstructive sleep apnea-hypopnea syndrome 05/07/2010    Past Medical History:  Diagnosis Date  . Hypertension   . Sleep apnea     History reviewed. No pertinent surgical history.  Social History   Socioeconomic History  . Marital status: Single    Spouse name: Not on file  . Number of children: Not on file  . Years of education: Not on file  . Highest education level: Not on file  Occupational History  . Not on file  Social Needs  .  Financial resource strain: Not on file  . Food insecurity:    Worry: Not on file    Inability: Not on file  . Transportation needs:    Medical: Not on file    Non-medical: Not on file  Tobacco Use  . Smoking status: Never Smoker  . Smokeless tobacco: Never Used  Substance and Sexual Activity  . Alcohol use: Yes    Alcohol/week: 3.0 standard drinks    Types: 3 Glasses of wine per week  . Drug use: No  . Sexual activity: Not on file  Lifestyle  . Physical activity:    Days per week: Not on file    Minutes per session: Not on file  . Stress: Not on file  Relationships  . Social connections:    Talks on phone: Not on file    Gets together: Not on file    Attends religious service: Not on file    Active member of club or organization: Not on file    Attends meetings of clubs or organizations: Not on file    Relationship status: Not on file  . Intimate partner violence:    Fear of current or ex partner: Not on file    Emotionally abused: Not on file    Physically abused: Not on file    Forced sexual activity: Not on file  Other Topics Concern  . Not on file  Social History Narrative  . Not on file    Family History  Problem Relation Age of Onset  . Cancer Mother      Review of Systems  Constitutional: Negative.  Negative for chills and fever.  HENT: Negative.  Negative for congestion, nosebleeds and sore throat.   Eyes: Negative.  Negative for blurred vision and double vision.  Respiratory: Negative.  Negative for cough and shortness of breath.   Cardiovascular: Positive for leg swelling. Negative for chest pain and palpitations.  Gastrointestinal: Negative.  Negative for abdominal pain, diarrhea, nausea and vomiting.  Genitourinary: Positive for frequency and urgency. Negative for dysuria, flank pain and hematuria.       LUTS nocturia  Musculoskeletal: Negative for back pain, myalgias and neck pain.  Skin: Negative.  Negative for rash.  Neurological: Negative.   Negative for dizziness and headaches.  Endo/Heme/Allergies: Negative.   All other systems reviewed and are negative.  Vitals:   05/23/19 1042  Pulse: 90  Resp: 18  Temp: 98.9 F (37.2 C)  SpO2: 99%     Physical Exam Vitals signs reviewed.  Constitutional:      Appearance: Normal appearance. He is obese.  HENT:     Head: Normocephalic and atraumatic.  Mouth/Throat:     Mouth: Mucous membranes are moist.     Pharynx: Oropharynx is clear.  Eyes:     Extraocular Movements: Extraocular movements intact.     Conjunctiva/sclera: Conjunctivae normal.     Pupils: Pupils are equal, round, and reactive to light.  Neck:     Musculoskeletal: Normal range of motion and neck supple.  Cardiovascular:     Rate and Rhythm: Normal rate and regular rhythm.     Pulses: Normal pulses.     Heart sounds: Normal heart sounds.  Pulmonary:     Effort: Pulmonary effort is normal.     Breath sounds: Normal breath sounds.  Musculoskeletal:        General: No tenderness or signs of injury.     Right lower leg: Edema present.     Left lower leg: Edema present.  Skin:    General: Skin is warm.     Capillary Refill: Capillary refill takes less than 2 seconds.  Neurological:     General: No focal deficit present.     Mental Status: He is alert and oriented to person, place, and time.  Psychiatric:        Mood and Affect: Mood normal.        Behavior: Behavior normal.      ASSESSMENT & PLAN: Bobby Mcguire was seen today for hypertension and leg swelling.  Diagnoses and all orders for this visit:  Essential hypertension -     CBC with Differential/Platelet -     Comprehensive metabolic panel -     Lipid panel -     furosemide (LASIX) 20 MG tablet; Take 1 tablet (20 mg total) by mouth daily for 7 days.  Dyslipidemia -     Lipid panel -     rosuvastatin (CRESTOR) 20 MG tablet; Take 1 tablet (20 mg total) by mouth daily.  Lower urinary tract symptoms (LUTS) -     PSA -     Ambulatory  referral to Urology  Bilateral leg edema -     furosemide (LASIX) 20 MG tablet; Take 1 tablet (20 mg total) by mouth daily for 7 days.    Patient Instructions       If you have lab work done today you will be contacted with your lab results within the next 2 weeks.  If you have not heard from [place] then please contact us. The fastest way to get your results is to register for My Chart.   IF you received an x-ray today, you will receive an invoice from [facility]. Please contact [facility] at [phone] with questions or concerns regarding your invoice.   IF you received labwork today, you will receive an invoice from [facility]. Please contact [facility] at [phone] with questions or concerns regarding your invoice.   Our billing staff will not be able to assist you with questions regarding bills from these companies.  You will be contacted with the lab results as soon as they are available. The fastest way to get your results is to activate your My Chart account. Instructions are located on the last page of this paperwork. If you have not heard from [place] regarding the results in 2 weeks, please contact this office.     Hypertension Hypertension, commonly called high blood pressure, is when the force of blood pumping through the arteries is too strong. The arteries are the blood vessels that carry blood from the heart throughout the body. Hypertension forces the heart to work  harder to pump blood and may cause arteries to become narrow or stiff. Having untreated or uncontrolled hypertension can cause heart attacks, strokes, kidney disease, and other problems. A blood pressure reading consists of a higher number over a lower number. Ideally, your blood pressure should be below 120/80. The first ("top") number is called the systolic pressure. It is a measure of the pressure in your arteries as your heart beats. The second ("bottom") number is called the diastolic pressure.  It is a measure of the pressure in your arteries as the heart relaxes. What are the causes? The cause of this condition is not known. What increases the risk? Some risk factors for high blood pressure are under your control. Others are not. Factors you can change  Smoking.  Having type 2 diabetes mellitus, high cholesterol, or both.  Not getting enough exercise or physical activity.  Being overweight.  Having too much fat, sugar, calories, or salt (sodium) in your diet.  Drinking too much alcohol. Factors that are difficult or impossible to change  Having chronic kidney disease.  Having a family history of high blood pressure.  Age. Risk increases with age.  Race. You may be at higher risk if you are African-American.  Gender. Men are at higher risk than women before age 68. After age 76, women are at higher risk than men.  Having obstructive sleep apnea.  Stress. What are the signs or symptoms? Extremely high blood pressure (hypertensive crisis) may cause:  Headache.  Anxiety.  Shortness of breath.  Nosebleed.  Nausea and vomiting.  Severe chest pain.  Jerky movements you cannot control (seizures). How is this diagnosed? This condition is diagnosed by measuring your blood pressure while you are seated, with your arm resting on a surface. The cuff of the blood pressure monitor will be placed directly against the skin of your upper arm at the level of your heart. It should be measured at least twice using the same arm. Certain conditions can cause a difference in blood pressure between your right and left arms. Certain factors can cause blood pressure readings to be lower or higher than normal (elevated) for a short period of time:  When your blood pressure is higher when you are in a health care provider's office than when you are at home, this is called white coat hypertension. Most people with this condition do not need medicines.  When your blood pressure is  higher at home than when you are in a health care provider's office, this is called masked hypertension. Most people with this condition may need medicines to control blood pressure. If you have a high blood pressure reading during one visit or you have normal blood pressure with other risk factors:  You may be asked to return on a different day to have your blood pressure checked again.  You may be asked to monitor your blood pressure at home for 1 week or longer. If you are diagnosed with hypertension, you may have other blood or imaging tests to help your health care provider understand your overall risk for other conditions. How is this treated? This condition is treated by making healthy lifestyle changes, such as eating healthy foods, exercising more, and reducing your alcohol intake. Your health care provider may prescribe medicine if lifestyle changes are not enough to get your blood pressure under control, and if:  Your systolic blood pressure is above 130.  Your diastolic blood pressure is above 80. Your personal target blood pressure may  vary depending on your medical conditions, your age, and other factors. Follow these instructions at home: Eating and drinking   Eat a diet that is high in fiber and potassium, and low in sodium, added sugar, and fat. An example eating plan is called the DASH (Dietary Approaches to Stop Hypertension) diet. To eat this way: ? Eat plenty of fresh fruits and vegetables. Try to fill half of your plate at each meal with fruits and vegetables. ? Eat whole grains, such as whole wheat pasta, brown rice, or whole grain bread. Fill about one quarter of your plate with whole grains. ? Eat or drink low-fat dairy products, such as skim milk or low-fat yogurt. ? Avoid fatty cuts of meat, processed or cured meats, and poultry with skin. Fill about one quarter of your plate with lean proteins, such as fish, chicken without skin, beans, eggs, and tofu. ? Avoid  premade and processed foods. These tend to be higher in sodium, added sugar, and fat.  Reduce your daily sodium intake. Most people with hypertension should eat less than 1,500 mg of sodium a day.  Limit alcohol intake to no more than 1 drink a day for nonpregnant women and 2 drinks a day for men. One drink equals 12 oz of beer, 5 oz of wine, or 1 oz of hard liquor. Lifestyle   Work with your health care provider to maintain a healthy body weight or to lose weight. Ask what an ideal weight is for you.  Get at least 30 minutes of exercise that causes your heart to beat faster (aerobic exercise) most days of the week. Activities may include walking, swimming, or biking.  Include exercise to strengthen your muscles (resistance exercise), such as pilates or lifting weights, as part of your weekly exercise routine. Try to do these types of exercises for 30 minutes at least 3 days a week.  Do not use any products that contain nicotine or tobacco, such as cigarettes and e-cigarettes. If you need help quitting, ask your health care provider.  Monitor your blood pressure at home as told by your health care provider.  Keep all follow-up visits as told by your health care provider. This is important. Medicines  Take over-the-counter and prescription medicines only as told by your health care provider. Follow directions carefully. Blood pressure medicines must be taken as prescribed.  Do not skip doses of blood pressure medicine. Doing this puts you at risk for problems and can make the medicine less effective.  Ask your health care provider about side effects or reactions to medicines that you should watch for. Contact a health care provider if:  You think you are having a reaction to a medicine you are taking.  You have headaches that keep coming back (recurring).  You feel dizzy.  You have swelling in your ankles.  You have trouble with your vision. Get help right away if:  You develop  a severe headache or confusion.  You have unusual weakness or numbness.  You feel faint.  You have severe pain in your chest or abdomen.  You vomit repeatedly.  You have trouble breathing. Summary  Hypertension is when the force of blood pumping through your arteries is too strong. If this condition is not controlled, it may put you at risk for serious complications.  Your personal target blood pressure may vary depending on your medical conditions, your age, and other factors. For most people, a normal blood pressure is less than 120/80.  Hypertension is  treated with lifestyle changes, medicines, or a combination of both. Lifestyle changes include weight loss, eating a healthy, low-sodium diet, exercising more, and limiting alcohol. This information is not intended to replace advice given to you by your health care provider. Make sure you discuss any questions you have with your health care provider. Document Released: 12/05/2005 Document Revised: 11/02/2016 Document Reviewed: 11/02/2016 Elsevier Interactive Patient Education  2019 [employer]       Agustina Caroli, MD [facility]

## 2019-05-23 NOTE — Patient Instructions (Addendum)
   If you have lab work done today you will be contacted with your lab results within the next 2 weeks.  If you have not heard from us then please contact us. The fastest way to get your results is to register for My Chart.   IF you received an x-ray today, you will receive an invoice from [facility]. Please contact [facility] at [phone] with questions or concerns regarding your invoice.   IF you received labwork today, you will receive an invoice from [facility]. Please contact [facility] at [phone] with questions or concerns regarding your invoice.   Our billing staff will not be able to assist you with questions regarding bills from these companies.  You will be contacted with the lab results as soon as they are available. The fastest way to get your results is to activate your My Chart account. Instructions are located on the last page of this paperwork. If you have not heard from us regarding the results in 2 weeks, please contact this office.       Hypertension Hypertension, commonly called high blood pressure, is when the force of blood pumping through the arteries is too strong. The arteries are the blood vessels that carry blood from the heart throughout the body. Hypertension forces the heart to work harder to pump blood and may cause arteries to become narrow or stiff. Having untreated or uncontrolled hypertension can cause heart attacks, strokes, kidney disease, and other problems. A blood pressure reading consists of a higher number over a lower number. Ideally, your blood pressure should be below 120/80. The first ("top") number is called the systolic pressure. It is a measure of the pressure in your arteries as your heart beats. The second ("bottom") number is called the diastolic pressure. It is a measure of the pressure in your arteries as the heart relaxes. What are the causes? The cause of this condition is not known. What increases the  risk? Some risk factors for high blood pressure are under your control. Others are not. Factors you can change  Smoking.  Having type 2 diabetes mellitus, high cholesterol, or both.  Not getting enough exercise or physical activity.  Being overweight.  Having too much fat, sugar, calories, or salt (sodium) in your diet.  Drinking too much alcohol. Factors that are difficult or impossible to change  Having chronic kidney disease.  Having a family history of high blood pressure.  Age. Risk increases with age.  Race. You may be at higher risk if you are African-American.  Gender. Men are at higher risk than women before age 45. After age 65, women are at higher risk than men.  Having obstructive sleep apnea.  Stress. What are the signs or symptoms? Extremely high blood pressure (hypertensive crisis) may cause:  Headache.  Anxiety.  Shortness of breath.  Nosebleed.  Nausea and vomiting.  Severe chest pain.  Jerky movements you cannot control (seizures). How is this diagnosed? This condition is diagnosed by measuring your blood pressure while you are seated, with your arm resting on a surface. The cuff of the blood pressure monitor will be placed directly against the skin of your upper arm at the level of your heart. It should be measured at least twice using the same arm. Certain conditions can cause a difference in blood pressure between your right and left arms. Certain factors can cause blood pressure readings to be lower or higher than normal (elevated) for a short period of time:    When your blood pressure is higher when you are in a health care provider's office than when you are at home, this is called white coat hypertension. Most people with this condition do not need medicines.  When your blood pressure is higher at home than when you are in a health care provider's office, this is called masked hypertension. Most people with this condition may need medicines  to control blood pressure. If you have a high blood pressure reading during one visit or you have normal blood pressure with other risk factors:  You may be asked to return on a different day to have your blood pressure checked again.  You may be asked to monitor your blood pressure at home for 1 week or longer. If you are diagnosed with hypertension, you may have other blood or imaging tests to help your health care provider understand your overall risk for other conditions. How is this treated? This condition is treated by making healthy lifestyle changes, such as eating healthy foods, exercising more, and reducing your alcohol intake. Your health care provider may prescribe medicine if lifestyle changes are not enough to get your blood pressure under control, and if:  Your systolic blood pressure is above 130.  Your diastolic blood pressure is above 80. Your personal target blood pressure may vary depending on your medical conditions, your age, and other factors. Follow these instructions at home: Eating and drinking   Eat a diet that is high in fiber and potassium, and low in sodium, added sugar, and fat. An example eating plan is called the DASH (Dietary Approaches to Stop Hypertension) diet. To eat this way: ? Eat plenty of fresh fruits and vegetables. Try to fill half of your plate at each meal with fruits and vegetables. ? Eat whole grains, such as whole wheat pasta, brown rice, or whole grain bread. Fill about one quarter of your plate with whole grains. ? Eat or drink low-fat dairy products, such as skim milk or low-fat yogurt. ? Avoid fatty cuts of meat, processed or cured meats, and poultry with skin. Fill about one quarter of your plate with lean proteins, such as fish, chicken without skin, beans, eggs, and tofu. ? Avoid premade and processed foods. These tend to be higher in sodium, added sugar, and fat.  Reduce your daily sodium intake. Most people with hypertension should  eat less than 1,500 mg of sodium a day.  Limit alcohol intake to no more than 1 drink a day for nonpregnant women and 2 drinks a day for men. One drink equals 12 oz of beer, 5 oz of wine, or 1 oz of hard liquor. Lifestyle   Work with your health care provider to maintain a healthy body weight or to lose weight. Ask what an ideal weight is for you.  Get at least 30 minutes of exercise that causes your heart to beat faster (aerobic exercise) most days of the week. Activities may include walking, swimming, or biking.  Include exercise to strengthen your muscles (resistance exercise), such as pilates or lifting weights, as part of your weekly exercise routine. Try to do these types of exercises for 30 minutes at least 3 days a week.  Do not use any products that contain nicotine or tobacco, such as cigarettes and e-cigarettes. If you need help quitting, ask your health care provider.  Monitor your blood pressure at home as told by your health care provider.  Keep all follow-up visits as told by your health care provider.   This is important. Medicines  Take over-the-counter and prescription medicines only as told by your health care provider. Follow directions carefully. Blood pressure medicines must be taken as prescribed.  Do not skip doses of blood pressure medicine. Doing this puts you at risk for problems and can make the medicine less effective.  Ask your health care provider about side effects or reactions to medicines that you should watch for. Contact a health care provider if:  You think you are having a reaction to a medicine you are taking.  You have headaches that keep coming back (recurring).  You feel dizzy.  You have swelling in your ankles.  You have trouble with your vision. Get help right away if:  You develop a severe headache or confusion.  You have unusual weakness or numbness.  You feel faint.  You have severe pain in your chest or abdomen.  You vomit  repeatedly.  You have trouble breathing. Summary  Hypertension is when the force of blood pumping through your arteries is too strong. If this condition is not controlled, it may put you at risk for serious complications.  Your personal target blood pressure may vary depending on your medical conditions, your age, and other factors. For most people, a normal blood pressure is less than 120/80.  Hypertension is treated with lifestyle changes, medicines, or a combination of both. Lifestyle changes include weight loss, eating a healthy, low-sodium diet, exercising more, and limiting alcohol. This information is not intended to replace advice given to you by your health care provider. Make sure you discuss any questions you have with your health care provider. Document Released: 12/05/2005 Document Revised: 11/02/2016 Document Reviewed: 11/02/2016 Elsevier Interactive Patient Education  2019 [employer]vier Inc.  

## 2019-05-24 LAB — LIPID PANEL
Chol/HDL Ratio: 4.2 ratio (ref 0.0–5.0)
Cholesterol, Total: 186 mg/dL (ref 100–199)
HDL: 44 mg/dL (ref >39)
LDL Calculated: 115 mg/dL — ABNORMAL HIGH (ref 0–99)
Triglycerides: 137 mg/dL (ref 0–149)
VLDL Cholesterol Cal: 27 mg/dL (ref 5–40)

## 2019-05-24 LAB — CBC WITH DIFFERENTIAL/PLATELET
Basophils Absolute: 0 10*3/uL (ref 0.0–0.2)
Basos: 0 %
EOS (ABSOLUTE): 0 10*3/uL (ref 0.0–0.4)
Eos: 0 %
Hematocrit: 42.9 % (ref 37.5–51.0)
Hemoglobin: 14.2 g/dL (ref 13.0–17.7)
Immature Grans (Abs): 0 10*3/uL (ref 0.0–0.1)
Immature Granulocytes: 0 %
Lymphocytes Absolute: 2.8 10*3/uL (ref 0.7–3.1)
Lymphs: 30 %
MCH: 32.9 pg (ref 26.6–33.0)
MCHC: 33.1 g/dL (ref 31.5–35.7)
MCV: 99 fL — ABNORMAL HIGH (ref 79–97)
Monocytes Absolute: 0.7 10*3/uL (ref 0.1–0.9)
Monocytes: 8 %
Neutrophils Absolute: 5.7 10*3/uL (ref 1.4–7.0)
Neutrophils: 62 %
Platelets: 331 10*3/uL (ref 150–450)
RBC: 4.32 x10E6/uL (ref 4.14–5.80)
RDW: 11.4 % — ABNORMAL LOW (ref 11.6–15.4)
WBC: 9.3 10*3/uL (ref 3.4–10.8)

## 2019-05-24 LAB — PSA: Prostate Specific Ag, Serum: 4.7 ng/mL — ABNORMAL HIGH (ref 0.0–4.0)

## 2019-05-24 LAB — COMPREHENSIVE METABOLIC PANEL
ALT: 30 IU/L (ref 0–44)
AST: 20 IU/L (ref 0–40)
Albumin/Globulin Ratio: 1.7 (ref 1.2–2.2)
Albumin: 4.7 g/dL (ref 3.8–4.9)
Alkaline Phosphatase: 65 IU/L (ref 39–117)
BUN/Creatinine Ratio: 9 (ref 9–20)
BUN: 9 mg/dL (ref 6–24)
Bilirubin Total: 0.6 mg/dL (ref 0.0–1.2)
CO2: 23 mmol/L (ref 20–29)
Calcium: 10.4 mg/dL — ABNORMAL HIGH (ref 8.7–10.2)
Chloride: 101 mmol/L (ref 96–106)
Creatinine, Ser: 1.01 mg/dL (ref 0.76–1.27)
GFR calc Af Amer: 98 mL/min/{1.73_m2} (ref >59)
GFR calc non Af Amer: 85 mL/min/{1.73_m2} (ref >59)
Globulin, Total: 2.8 g/dL (ref 1.5–4.5)
Glucose: 93 mg/dL (ref 65–99)
Potassium: 4.6 mmol/L (ref 3.5–5.2)
Sodium: 139 mmol/L (ref 134–144)
Total Protein: 7.5 g/dL (ref 6.0–8.5)

## 2019-05-26 ENCOUNTER — Encounter: Payer: Self-pay | Admitting: Emergency Medicine

## 2019-05-28 ENCOUNTER — Other Ambulatory Visit: Payer: Self-pay

## 2019-05-28 ENCOUNTER — Ambulatory Visit (INDEPENDENT_AMBULATORY_CARE_PROVIDER_SITE_OTHER): Payer: BC Managed Care – PPO

## 2019-05-28 ENCOUNTER — Ambulatory Visit (INDEPENDENT_AMBULATORY_CARE_PROVIDER_SITE_OTHER): Payer: BC Managed Care – PPO | Admitting: Podiatry

## 2019-05-28 DIAGNOSIS — R609 Edema, unspecified: Secondary | ICD-10-CM

## 2019-05-28 DIAGNOSIS — L600 Ingrowing nail: Secondary | ICD-10-CM | POA: Diagnosis not present

## 2019-05-28 MED ORDER — CEPHALEXIN 500 MG PO CAPS: 500.0000 mg | ORAL_CAPSULE | Freq: Three times a day (TID) | ORAL | 0 refills | Status: DC

## 2019-05-28 MED ORDER — MUPIROCIN 2 % EX OINT: 1.0000 "application " | TOPICAL_OINTMENT | Freq: Two times a day (BID) | CUTANEOUS | 2 refills | Status: DC

## 2019-05-28 MED ORDER — OXYCODONE-ACETAMINOPHEN 10-325 MG PO TABS: 1.0000 | ORAL_TABLET | Freq: Four times a day (QID) | ORAL | 0 refills | Status: DC | PRN

## 2019-05-30 ENCOUNTER — Other Ambulatory Visit: Payer: Self-pay

## 2019-05-30 ENCOUNTER — Ambulatory Visit (INDEPENDENT_AMBULATORY_CARE_PROVIDER_SITE_OTHER): Payer: BC Managed Care – PPO | Admitting: Emergency Medicine

## 2019-05-30 ENCOUNTER — Encounter: Payer: Self-pay | Admitting: Emergency Medicine

## 2019-05-30 VITALS — BP 154/72 | HR 71 | Temp 98.5°F | Resp 16 | Wt 337.2 lb

## 2019-05-30 DIAGNOSIS — E785 Hyperlipidemia, unspecified: Secondary | ICD-10-CM

## 2019-05-30 DIAGNOSIS — R600 Localized edema: Secondary | ICD-10-CM | POA: Diagnosis not present

## 2019-05-30 DIAGNOSIS — I10 Essential (primary) hypertension: Secondary | ICD-10-CM | POA: Diagnosis not present

## 2019-05-30 DIAGNOSIS — G4733 Obstructive sleep apnea (adult) (pediatric): Secondary | ICD-10-CM | POA: Diagnosis not present

## 2019-05-30 MED ORDER — FUROSEMIDE 20 MG PO TABS: 20.0000 mg | ORAL_TABLET | Freq: Every day | ORAL | 1 refills | Status: DC

## 2019-05-30 NOTE — Patient Instructions (Addendum)
° ° ° °If you have lab work done today you will be contacted with your lab results within the next 2 weeks.  If you have not heard from us then please contact us. The fastest way to get your results is to register for My Chart. ° ° °IF you received an x-ray today, you will receive an invoice from [facility]. Please contact [facility] at [phone] with questions or concerns regarding your invoice.  ° °IF you received labwork today, you will receive an invoice from [facility]. Please contact [facility] at [phone] with questions or concerns regarding your invoice.  ° °Our billing staff will not be able to assist you with questions regarding bills from these companies. ° °You will be contacted with the lab results as soon as they are available. The fastest way to get your results is to activate your My Chart account. Instructions are located on the last page of this paperwork. If you have not heard from us regarding the results in 2 weeks, please contact this office. °  ° ° °Edema ° °Edema is when you have too much fluid in your body or under your skin. Edema may make your legs, feet, and ankles swell up. Swelling is also common in looser tissues, like around your eyes. This is a common condition. It gets more common as you get older. There are many possible causes of edema. Eating too much salt (sodium) and being on your feet or sitting for a long time can cause edema in your legs, feet, and ankles. Hot weather may make edema worse. °Edema is usually painless. Your skin may look swollen or shiny. °Follow these instructions at home: °· Keep the swollen body part raised (elevated) above the level of your heart when you are sitting or lying down. °· Do not sit still or stand for a long time. °· Do not wear tight clothes. Do not wear garters on your upper legs. °· Exercise your legs. This can help the swelling go down. °· Wear elastic bandages or support stockings as told by your doctor. °· Eat a  low-salt (low-sodium) diet to reduce fluid as told by your doctor. °· Depending on the cause of your swelling, you may need to limit how much fluid you drink (fluid restriction). °· Take over-the-counter and prescription medicines only as told by your doctor. °Contact a doctor if: °· Treatment is not working. °· You have heart, liver, or kidney disease and have symptoms of edema. °· You have sudden and unexplained weight gain. °Get help right away if: °· You have shortness of breath or chest pain. °· You cannot breathe when you lie down. °· You have pain, redness, or warmth in the swollen areas. °· You have heart, liver, or kidney disease and get edema all of a sudden. °· You have a fever and your symptoms get worse all of a sudden. °Summary °· Edema is when you have too much fluid in your body or under your skin. °· Edema may make your legs, feet, and ankles swell up. Swelling is also common in looser tissues, like around your eyes. °· Raise (elevate) the swollen body part above the level of your heart when you are sitting or lying down. °· Follow your doctor's instructions about diet and how much fluid you can drink (fluid restriction). °This information is not intended to replace advice given to you by your health care provider. Make sure you discuss any questions you have with your health care provider. °Document   Released: 05/23/2008 Document Revised: 12/23/2016 Document Reviewed: 12/23/2016 Elsevier Interactive Patient Education  2019 [employer].

## 2019-05-30 NOTE — Progress Notes (Signed)
Bobby Mcguire 53 y.o.   Chief Complaint  Patient presents with  . Foot Swelling    LEFT surgery on 05/17/2019    HISTORY OF PRESENT ILLNESS: This is a 53 y.o. male complaining of swelling of left foot status post podiatry surgery on 05/17/2019 with multiple nail removals.  Seen on follow-up 2 days ago by podiatrist and started on antibiotic.  Also has bilateral leg edema.  I started him on a 7-day course of 20 mg of furosemide with good results.  No history of congestive heart failure or renal failure.  Denies difficulty breathing or dyspnea on exertion.  No clinical symptoms of CHF.  Has a history of hypertension and severe sleep apnea.  Also morbidly obese.  No history of gout but inquiring about it.  HPI   Prior to Admission medications   Medication Sig Start Date End Date Taking? Authorizing Provider  amLODipine (NORVASC) 10 MG tablet TAKE 1 TABLET DAILY 04/16/19  Yes Marrian Salvage, FNP  cephALEXin (KEFLEX) 500 MG capsule Take 1 capsule (500 mg total) by mouth 3 (three) times daily. 05/28/19  Yes Trula Slade, DPM  hydrOXYzine (ATARAX/VISTARIL) 25 MG tablet TAKE 1 TABLET THREE TIMES A DAY AS NEEDED 02/06/19  Yes Marrian Salvage, FNP  ibuprofen (ADVIL,MOTRIN) 800 MG tablet ibuprofen 800 mg tablet  TAKE 2 TABLETS TWICE A DAY   Yes [provider]  losartan (COZAAR) 100 MG tablet Take 1 tablet (100 mg total) by mouth daily. 01/28/19  Yes Marrian Salvage, FNP  Multiple Vitamin (MULTIVITAMIN WITH MINERALS) TABS tablet Take 1 tablet by mouth daily.   Yes [provider]  mupirocin ointment (BACTROBAN) 2 % Apply 1 application topically 2 (two) times daily. 05/28/19  Yes Trula Slade, DPM  NEOMYCIN-POLYMYXIN-HYDROCORTISONE (CORTISPORIN) 1 % SOLN OTIC solution Apply 1-2 drops to toe BID after soaking 05/17/19  Yes Regal, Tamala Fothergill, DPM  oxyCODONE-acetaminophen (PERCOCET) 10-325 MG tablet Take 1 tablet by mouth every 6 (six) hours as needed for pain.  05/28/19  Yes Trula Slade, DPM  rosuvastatin (CRESTOR) 20 MG tablet Take 1 tablet (20 mg total) by mouth daily. 05/23/19  Yes Royden Bulman, Ines Bloomer, MD  sildenafil (VIAGRA) 100 MG tablet TAKE ONE-HALF (1/2) TO ONE TABLET DAILY AS NEEDED FOR ERECTILE DYSFUNCTION 04/03/19  Yes Marrian Salvage, FNP  furosemide (LASIX) 20 MG tablet Take 1 tablet (20 mg total) by mouth daily for 7 days. Patient not taking: Reported on 05/30/2019 05/23/19 05/30/19  [name], MD  Ibuprofen-Famotidine (DUEXIS) 800-26.6 MG TABS Duexis 800 mg-26.6 mg tablet  Take 1 tablet 3 times a day by oral route.    [provider]    Allergies  Allergen Reactions  . Other     Patient Active Problem List   Diagnosis Date Noted  . Morbid obesity with body mass index (BMI) of 40.0 to 49.9 ([place]) 02/13/2019  . Osteoarthritis of knee 01/09/2018  . Erectile dysfunction 09/19/2017  . Essential hypertension 06/22/2017  . Severe obstructive sleep apnea-hypopnea syndrome 05/07/2010    Past Medical History:  Diagnosis Date  . Hypertension   . Sleep apnea     No past surgical history on file.  Social History   Socioeconomic History  . Marital status: Single    Spouse name: Not on file  . Number of children: Not on file  . Years of education: Not on file  . Highest education level: Not on file  Occupational History  . Not on  file  Social Needs  . Financial resource strain: Not on file  . Food insecurity    Worry: Not on file    Inability: Not on file  . Transportation needs    Medical: Not on file    Non-medical: Not on file  Tobacco Use  . Smoking status: Never Smoker  . Smokeless tobacco: Never Used  Substance and Sexual Activity  . Alcohol use: Yes    Alcohol/week: 3.0 standard drinks    Types: 3 Glasses of wine per week  . Drug use: No  . Sexual activity: Not on file  Lifestyle  . Physical activity    Days per week: Not on file    Minutes per session: Not on file  . Stress:  Not on file  Relationships  . Social Herbalist on phone: Not on file    Gets together: Not on file    Attends religious service: Not on file    Active member of club or organization: Not on file    Attends meetings of clubs or organizations: Not on file    Relationship status: Not on file  . Intimate partner violence    Fear of current or ex partner: Not on file    Emotionally abused: Not on file    Physically abused: Not on file    Forced sexual activity: Not on file  Other Topics Concern  . Not on file  Social History Narrative  . Not on file    Family History  Problem Relation Age of Onset  . Cancer Mother      Review of Systems  Constitutional: Negative.  Negative for chills and fever.  HENT: Negative for congestion and sore throat.   Respiratory: Negative.  Negative for cough, hemoptysis and shortness of breath.   Cardiovascular: Positive for leg swelling. Negative for chest pain, palpitations, orthopnea and PND.  Gastrointestinal: Negative.  Negative for abdominal pain, diarrhea, nausea and vomiting.  Genitourinary: Negative.  Negative for dysuria.  Musculoskeletal: Negative.  Negative for back pain, myalgias and neck pain.  Skin: Negative.  Negative for rash.  Neurological: Negative.  Negative for dizziness and headaches.  Endo/Heme/Allergies: Negative.   All other systems reviewed and are negative.  Vitals:   05/30/19 1007  BP: (!) 154/72  Pulse: 71  Resp: 16  Temp: 98.5 F (36.9 C)  SpO2: 100%     Physical Exam Vitals signs reviewed.  Constitutional:      Appearance: Normal appearance. He is obese.  HENT:     Head: Normocephalic and atraumatic.  Eyes:     Extraocular Movements: Extraocular movements intact.     Conjunctiva/sclera: Conjunctivae normal.     Pupils: Pupils are equal, round, and reactive to light.  Neck:     Musculoskeletal: Normal range of motion and neck supple.  Cardiovascular:     Rate and Rhythm: Normal rate and  regular rhythm.     Heart sounds: Normal heart sounds.  Pulmonary:     Effort: Pulmonary effort is normal.     Breath sounds: Normal breath sounds.  Musculoskeletal:     Right lower leg: Edema present.     Left lower leg: Edema present.     Comments: Left foot: Positive swelling but no significant erythema.  Nontender to touch.  Warm. NVI.  Skin:    General: Skin is warm and dry.     Capillary Refill: Capillary refill takes less than 2 seconds.  Neurological:  General: No focal deficit present.     Mental Status: He is alert and oriented to person, place, and time.  Psychiatric:        Mood and Affect: Mood normal.        Behavior: Behavior normal.      ASSESSMENT & PLAN: Kara was seen today for foot swelling.  Diagnoses and all orders for this visit:  Bilateral leg edema -     furosemide (LASIX) 20 MG tablet; Take 1 tablet (20 mg total) by mouth daily for 30 days.  Essential hypertension -     furosemide (LASIX) 20 MG tablet; Take 1 tablet (20 mg total) by mouth daily for 30 days.  Dyslipidemia    Patient Instructions       If you have lab work done today you will be contacted with your lab results within the next 2 weeks.  If you have not heard from [place] then please contact us. The fastest way to get your results is to register for My Chart.   IF you received an x-ray today, you will receive an invoice from [facility]. Please contact [facility] at [phone] with questions or concerns regarding your invoice.   IF you received labwork today, you will receive an invoice from [facility]. Please contact [facility] at [phone] with questions or concerns regarding your invoice.   Our billing staff will not be able to assist you with questions regarding bills from these companies.  You will be contacted with the lab results as soon as they are available. The fastest way to get your results is to activate your My Chart account. Instructions are  located on the last page of this paperwork. If you have not heard from [place] regarding the results in 2 weeks, please contact this office.     Edema  Edema is when you have too much fluid in your body or under your skin. Edema may make your legs, feet, and ankles swell up. Swelling is also common in looser tissues, like around your eyes. This is a common condition. It gets more common as you get older. There are many possible causes of edema. Eating too much salt (sodium) and being on your feet or sitting for a long time can cause edema in your legs, feet, and ankles. Hot weather may make edema worse. Edema is usually painless. Your skin may look swollen or shiny. Follow these instructions at home:  Keep the swollen body part raised (elevated) above the level of your heart when you are sitting or lying down.  Do not sit still or stand for a long time.  Do not wear tight clothes. Do not wear garters on your upper legs.  Exercise your legs. This can help the swelling go down.  Wear elastic bandages or support stockings as told by your doctor.  Eat a low-salt (low-sodium) diet to reduce fluid as told by your doctor.  Depending on the cause of your swelling, you may need to limit how much fluid you drink (fluid restriction).  Take over-the-counter and prescription medicines only as told by your doctor. Contact a doctor if:  Treatment is not working.  You have heart, liver, or kidney disease and have symptoms of edema.  You have sudden and unexplained weight gain. Get help right away if:  You have shortness of breath or chest pain.  You cannot breathe when you lie down.  You have pain, redness, or warmth in the swollen areas.  You have heart, liver,  or kidney disease and get edema all of a sudden.  You have a fever and your symptoms get worse all of a sudden. Summary  Edema is when you have too much fluid in your body or under your skin.  Edema may make your legs, feet, and  ankles swell up. Swelling is also common in looser tissues, like around your eyes.  Raise (elevate) the swollen body part above the level of your heart when you are sitting or lying down.  Follow your doctor's instructions about diet and how much fluid you can drink (fluid restriction). This information is not intended to replace advice given to you by your health care provider. Make sure you discuss any questions you have with your health care provider. Document Released: 05/23/2008 Document Revised: 12/23/2016 Document Reviewed: 12/23/2016 Elsevier Interactive Patient Education  2019 [employer] Inc.       Agustina Caroli, MD [facility]

## 2019-06-02 NOTE — Progress Notes (Signed)
Subjective: 53 year old male presents the office for concerns of swelling to his left foot.  He previously had 4 toenails removed on May 17, 2019 Dr. Paulla Dolly.  Over the last week he has noted swelling to his foot.  In general he has had swelling to both of his legs he was recently on a fluid pill for this.  I saw him back in December for similar issue after he had the toenails removed on the right side.  He has been clean the toenails and has been soaking in vinegar soaks.  Denies any systemic complaints such as fevers, chills, nausea, vomiting. No acute changes since last appointment, and no other complaints at this time.   Objective: AAO x3, NAD DP/PT pulses palpable bilaterally, CRT less than 3 seconds Status post digits 2 through 5 total nail avulsion.  There is scabbed only area mild granulation tissue present.  There is no drainage or pus identified today.  There is mild swelling to the left foot there is no significant erythema or warmth.  The swelling is worse than left foot compared to the contralateral extremity. No open lesions or pre-ulcerative lesions.  No pain with calf compression, swelling, warmth, erythema  Assessment: Left foot swelling after undergoing total nail avulsions p  Plan: -All treatment options discussed with the patient including all alternatives, risks, complications.  -1 to switch to Epson salt soaks.  Antibiotic ointment and a bandage daily.  Prescribed mupirocin ointment.  Prescribed Keflex.  Elevation. -Monitor for any clinical signs or symptoms of infection and directed to call the office immediately should any occur or go to the ER. -Continue Lasix as directed by PCP.  -Patient encouraged to call the office with any questions, concerns, change in symptoms.   Trula Slade DPM

## 2019-06-05 ENCOUNTER — Other Ambulatory Visit: Payer: Self-pay | Admitting: Family

## 2019-06-05 ENCOUNTER — Telehealth: Payer: Self-pay | Admitting: Emergency Medicine

## 2019-06-05 DIAGNOSIS — N529 Male erectile dysfunction, unspecified: Secondary | ICD-10-CM

## 2019-06-05 NOTE — Telephone Encounter (Signed)
Medication Refill - Medication: sildenafil (VIAGRA) 100 MG tablet  Refill request for the above medication  Preferred Pharmacy (with phone number or street name):  [facility] - [address], [place] [phone] (Phone) [phone] (Fax)

## 2019-06-05 NOTE — Telephone Encounter (Signed)
Please advise. Pt requesting sildenafil 100 mg.

## 2019-06-06 ENCOUNTER — Other Ambulatory Visit: Payer: Self-pay | Admitting: Emergency Medicine

## 2019-06-06 ENCOUNTER — Other Ambulatory Visit: Payer: Self-pay

## 2019-06-06 ENCOUNTER — Ambulatory Visit: Payer: BC Managed Care – PPO

## 2019-06-06 DIAGNOSIS — R609 Edema, unspecified: Secondary | ICD-10-CM

## 2019-06-06 DIAGNOSIS — L600 Ingrowing nail: Secondary | ICD-10-CM

## 2019-06-06 NOTE — Telephone Encounter (Signed)
Done. Thanks.

## 2019-06-07 NOTE — Telephone Encounter (Signed)
Please find out who refilled this in my name. He is no longer under my care and has transferred back to [place].  Please print and share with Almyra Free-

## 2019-06-07 NOTE — Telephone Encounter (Signed)
Medication approved under Dr. Mitchel Honour with [facility]. Patient no longer under Laura's care and is patient at [facility].

## 2019-06-10 NOTE — Progress Notes (Signed)
Patient is here today for a follow up appointment, recent procedure perform on 05/16/19, total nail avulsions 2-5 digits left foot. He said that he feels like the nails look better and the swelling has gone down a lot since his last appointment.    Noted well healing nail beds on all 4 toes. There was some swelling still present in the foot and toes but this has improved significantly since last visit. Overall the nails appear to be healing well. No redness, no erythema, moderate swelling but improved. No purulent drainage.   Advise patient to continue with soaking and continue antibiotics until finished. Follow up if symptoms worsen or fail to improve.

## 2019-06-18 DIAGNOSIS — R972 Elevated prostate specific antigen [PSA]: Secondary | ICD-10-CM | POA: Diagnosis not present

## 2019-06-18 DIAGNOSIS — R350 Frequency of micturition: Secondary | ICD-10-CM | POA: Diagnosis not present

## 2019-06-29 DIAGNOSIS — G4733 Obstructive sleep apnea (adult) (pediatric): Secondary | ICD-10-CM | POA: Diagnosis not present

## 2019-07-11 ENCOUNTER — Other Ambulatory Visit: Payer: Self-pay | Admitting: Family

## 2019-07-15 ENCOUNTER — Other Ambulatory Visit: Payer: Self-pay | Admitting: Family

## 2019-07-16 DIAGNOSIS — C61 Malignant neoplasm of prostate: Secondary | ICD-10-CM | POA: Diagnosis not present

## 2019-07-16 DIAGNOSIS — R972 Elevated prostate specific antigen [PSA]: Secondary | ICD-10-CM | POA: Diagnosis not present

## 2019-07-22 ENCOUNTER — Ambulatory Visit (INDEPENDENT_AMBULATORY_CARE_PROVIDER_SITE_OTHER): Payer: BC Managed Care – PPO | Admitting: Emergency Medicine

## 2019-07-22 ENCOUNTER — Other Ambulatory Visit: Payer: Self-pay

## 2019-07-22 ENCOUNTER — Encounter: Payer: Self-pay | Admitting: Emergency Medicine

## 2019-07-22 ENCOUNTER — Ambulatory Visit (INDEPENDENT_AMBULATORY_CARE_PROVIDER_SITE_OTHER): Payer: BC Managed Care – PPO

## 2019-07-22 VITALS — BP 152/79 | HR 89 | Temp 99.2°F | Resp 16 | Ht 75.0 in | Wt 330.6 lb

## 2019-07-22 DIAGNOSIS — M7918 Myalgia, other site: Secondary | ICD-10-CM | POA: Diagnosis not present

## 2019-07-22 DIAGNOSIS — M546 Pain in thoracic spine: Secondary | ICD-10-CM | POA: Diagnosis not present

## 2019-07-22 DIAGNOSIS — M549 Dorsalgia, unspecified: Secondary | ICD-10-CM

## 2019-07-22 DIAGNOSIS — M62838 Other muscle spasm: Secondary | ICD-10-CM

## 2019-07-22 DIAGNOSIS — R600 Localized edema: Secondary | ICD-10-CM

## 2019-07-22 DIAGNOSIS — I10 Essential (primary) hypertension: Secondary | ICD-10-CM

## 2019-07-22 MED ORDER — FUROSEMIDE 20 MG PO TABS: 20.0000 mg | ORAL_TABLET | Freq: Every day | ORAL | 0 refills | Status: DC | PRN

## 2019-07-22 MED ORDER — CYCLOBENZAPRINE HCL 10 MG PO TABS: 10.0000 mg | ORAL_TABLET | Freq: Every day | ORAL | 0 refills | Status: DC

## 2019-07-22 MED ORDER — AMLODIPINE BESYLATE 10 MG PO TABS: 10.0000 mg | ORAL_TABLET | Freq: Every day | ORAL | 3 refills | Status: DC

## 2019-07-22 MED ORDER — MELOXICAM 15 MG PO TABS: 15.0000 mg | ORAL_TABLET | Freq: Every day | ORAL | 1 refills | Status: AC

## 2019-07-22 NOTE — Patient Instructions (Addendum)
If you have lab work done today you will be contacted with your lab results within the next 2 weeks.  If you have not heard from [place] then please contact us. The fastest way to get your results is to register for My Chart.   IF you received an x-ray today, you will receive an invoice from [facility]. Please contact [facility] at [phone] with questions or concerns regarding your invoice.   IF you received labwork today, you will receive an invoice from [place]. Please contact [facility] at [phone] with questions or concerns regarding your invoice.   Our billing staff will not be able to assist you with questions regarding bills from these companies.  You will be contacted with the lab results as soon as they are available. The fastest way to get your results is to activate your My Chart account. Instructions are located on the last page of this paperwork. If you have not heard from [place] regarding the results in 2 weeks, please contact this office.     Muscle Strain A muscle strain is an injury that happens when a muscle is stretched longer than normal. This can happen during a fall, sports, or lifting. This can tear some muscle fibers. Usually, recovery from muscle strain takes 1-2 weeks. Complete healing normally takes 5-6 weeks. This condition is first treated with PRICE therapy. This involves:  Protecting your muscle from being injured again.  Resting your injured muscle.  Icing your injured muscle.  Applying pressure (compression) to your injured muscle. This may be done with a splint or elastic bandage.  Raising (elevating) your injured muscle. Your doctor may also recommend medicine for pain. Follow these instructions at home: If you have a splint:  Wear the splint as told by your doctor. Take it off only as told by your doctor.  Loosen the splint if your fingers or toes tingle, get numb, or turn cold and blue.  Keep the splint clean.  If the  splint is not waterproof: ? Do not let it get wet. ? Cover it with a watertight covering when you take a bath or a shower. Managing pain, stiffness, and swelling   If directed, put ice on your injured area. ? If you have a removable splint, take it off as told by your doctor. ? Put ice in a plastic bag. ? Place a towel between your skin and the bag. ? Leave the ice on for 20 minutes, 2-3 times a day.  Move your fingers or toes often. This helps to avoid stiffness and lessen swelling.  Raise your injured area above the level of your heart while you are sitting or lying down.  Wear an elastic bandage as told by your doctor. Make sure it is not too tight. General instructions  Take over-the-counter and prescription medicines only as told by your doctor.  Limit your activity. Rest your injured muscle as told by your doctor. Your doctor may say that gentle movements are okay.  If physical therapy was prescribed, do exercises as told by your doctor.  Do not put pressure on any part of the splint until it is fully hardened. This may take many hours.  Do not use any products that contain nicotine or tobacco, such as cigarettes and e-cigarettes. These can delay bone healing. If you need help quitting, ask your doctor.  Warm up before you exercise. This helps to prevent more muscle strains.  Ask your doctor when it is safe to drive  if you have a splint.  Keep all follow-up visits as told by your doctor. This is important. Contact a doctor if:  You have more pain or swelling in your injured area. Get help right away if:  You have any of these problems in your injured area: ? You have numbness. ? You have tingling. ? You lose a lot of strength. Summary  A muscle strain is an injury that happens when a muscle is stretched longer than normal.  This condition is first treated with PRICE therapy. This includes protecting, resting, icing, adding pressure, and raising your  injury.  Limit your activity. Rest your injured muscle as told by your doctor. Your doctor may say that gentle movements are okay.  Warm up before you exercise. This helps to prevent more muscle strains. This information is not intended to replace advice given to you by your health care provider. Make sure you discuss any questions you have with your health care provider. Document Released: 09/13/2008 Document Revised: 01/31/2019 Document Reviewed: 01/11/2017 Elsevier Patient Education  2020 [employer].

## 2019-07-22 NOTE — Progress Notes (Signed)
Bobby Mcguire 53 y.o.   Chief Complaint  Patient presents with  . Back Pain    upper area x 1 1/2 weeks ago    HISTORY OF PRESENT ILLNESS: This is a 53 y.o. male complaining of left scapular pain that started 1-1/2 weeks ago.  Denies injury.  No associated symptoms.  Sharp constant pain worse with movement and better with rest.  No radiation.  HPI   Prior to Admission medications   Medication Sig Start Date End Date Taking? Authorizing Provider  amLODipine (NORVASC) 10 MG tablet TAKE 1 TABLET DAILY 04/16/19  Yes Marrian Salvage, FNP  hydrOXYzine (ATARAX/VISTARIL) 25 MG tablet TAKE 1 TABLET THREE TIMES A DAY AS NEEDED 02/06/19  Yes Marrian Salvage, FNP  ibuprofen (ADVIL,MOTRIN) 800 MG tablet ibuprofen 800 mg tablet  TAKE 2 TABLETS TWICE A DAY   Yes [provider]  losartan (COZAAR) 100 MG tablet Take 1 tablet (100 mg total) by mouth daily. 01/28/19  Yes Marrian Salvage, FNP  Multiple Vitamin (MULTIVITAMIN WITH MINERALS) TABS tablet Take 1 tablet by mouth daily.   Yes [provider]  rosuvastatin (CRESTOR) 20 MG tablet Take 1 tablet (20 mg total) by mouth daily. 05/23/19  Yes Kellee Sittner, Ines Bloomer, MD  sildenafil (VIAGRA) 100 MG tablet TAKE ONE-HALF (1/2) TO ONE TABLET DAILY AS NEEDED FOR ERECTILE DYSFUNCTION 06/06/19  Yes Elior Robinette, Ines Bloomer, MD  furosemide (LASIX) 20 MG tablet Take 1 tablet (20 mg total) by mouth daily for 30 days. 05/30/19 06/29/19  [name], MD  Ibuprofen-Famotidine (DUEXIS) 800-26.6 MG TABS Duexis 800 mg-26.6 mg tablet  Take 1 tablet 3 times a day by oral route.    [provider]  mupirocin ointment (BACTROBAN) 2 % Apply 1 application topically 2 (two) times daily. Patient not taking: Reported on 07/22/2019 05/28/19   Trula Slade, DPM  NEOMYCIN-POLYMYXIN-HYDROCORTISONE (CORTISPORIN) 1 % SOLN OTIC solution Apply 1-2 drops to toe BID after soaking Patient not taking: Reported on 07/22/2019 05/17/19   Wallene Huh, DPM  oxyCODONE-acetaminophen (PERCOCET) 10-325 MG tablet Take 1 tablet by mouth every 6 (six) hours as needed for pain. Patient not taking: Reported on 07/22/2019 05/28/19   Trula Slade, DPM    Allergies  Allergen Reactions  . Other     Patient Active Problem List   Diagnosis Date Noted  . Morbid obesity with body mass index (BMI) of 40.0 to 49.9 ([place]) 02/13/2019  . Osteoarthritis of knee 01/09/2018  . Erectile dysfunction 09/19/2017  . Essential hypertension 06/22/2017  . Severe obstructive sleep apnea-hypopnea syndrome 05/07/2010    Past Medical History:  Diagnosis Date  . Hypertension   . Sleep apnea     No past surgical history on file.  Social History   Socioeconomic History  . Marital status: Single    Spouse name: Not on file  . Number of children: Not on file  . Years of education: Not on file  . Highest education level: Not on file  Occupational History  . Not on file  Social Needs  . Financial resource strain: Not on file  . Food insecurity    Worry: Not on file    Inability: Not on file  . Transportation needs    Medical: Not on file    Non-medical: Not on file  Tobacco Use  . Smoking status: Never Smoker  . Smokeless tobacco: Never Used  Substance and Sexual Activity  . Alcohol use: Yes    Alcohol/week:  3.0 standard drinks    Types: 3 Glasses of wine per week  . Drug use: No  . Sexual activity: Not on file  Lifestyle  . Physical activity    Days per week: Not on file    Minutes per session: Not on file  . Stress: Not on file  Relationships  . Social Herbalist on phone: Not on file    Gets together: Not on file    Attends religious service: Not on file    Active member of club or organization: Not on file    Attends meetings of clubs or organizations: Not on file    Relationship status: Not on file  . Intimate partner violence    Fear of current or ex partner: Not on file    Emotionally abused: Not on file     Physically abused: Not on file    Forced sexual activity: Not on file  Other Topics Concern  . Not on file  Social History Narrative  . Not on file    Family History  Problem Relation Age of Onset  . Cancer Mother      Review of Systems  Constitutional: Negative.  Negative for chills and fever.  HENT: Negative.  Negative for congestion and sore throat.   Respiratory: Negative.  Negative for cough and shortness of breath.   Cardiovascular: Negative.  Negative for chest pain and palpitations.  Gastrointestinal: Negative.  Negative for abdominal pain, diarrhea, nausea and vomiting.  Genitourinary: Negative.  Negative for dysuria, flank pain and hematuria.  Musculoskeletal: Positive for back pain.  Skin: Negative.  Negative for rash.  Neurological: Negative.  Negative for dizziness and headaches.  Endo/Heme/Allergies: Negative.   All other systems reviewed and are negative.  Today's Vitals   07/22/19 1521  BP: (!) 152/79  Pulse: 89  Resp: 16  Temp: 99.2 F (37.3 C)  TempSrc: Oral  SpO2: 95%  Weight: (!) 330 lb 9.6 oz (150 kg)  Height: 6\' 3"  (1.905 m)   Body mass index is 41.32 kg/m.   Physical Exam Vitals signs reviewed.  Constitutional:      Appearance: Normal appearance.  HENT:     Head: Normocephalic and atraumatic.  Eyes:     Extraocular Movements: Extraocular movements intact.     Conjunctiva/sclera: Conjunctivae normal.     Pupils: Pupils are equal, round, and reactive to light.  Neck:     Musculoskeletal: Normal range of motion and neck supple.  Cardiovascular:     Rate and Rhythm: Normal rate and regular rhythm.     Heart sounds: Normal heart sounds.  Pulmonary:     Effort: Pulmonary effort is normal.     Breath sounds: Normal breath sounds.  Musculoskeletal: Normal range of motion.     Comments: Some tenderness to left scapular area  Skin:    General: Skin is warm and dry.     Capillary Refill: Capillary refill takes less than 2 seconds.   Neurological:     General: No focal deficit present.     Mental Status: He is alert and oriented to person, place, and time.  Psychiatric:        Mood and Affect: Mood normal.        Behavior: Behavior normal.    Dg Chest 2 View  Result Date: 07/22/2019 CLINICAL DATA:  Upper back pain. EXAM: CHEST - 2 VIEW COMPARISON:  Chest radiographs 08/05/2018. FINDINGS: Three views obtained. The heart size and mediastinal contours are  normal. The lungs are clear. There is no pleural effusion or pneumothorax. No acute osseous findings are identified. Stable thoracic spine degenerative changes. IMPRESSION: Stable chest.  No evidence of active cardiopulmonary process. Electronically Signed   By: Richardean Sale M.D.   On: 07/22/2019 16:12   Dg Thoracic Spine 2 View  Result Date: 07/22/2019 CLINICAL DATA:  Upper back pain. EXAM: THORACIC SPINE 2 VIEWS COMPARISON:  Chest radiographs 08/05/2018. FINDINGS: There are 12 thoracic type vertebral bodies. The alignment is near anatomic. The upper thoracic spine is not well visualized in the lateral projection, and no swimmer's view was obtained. There are stable mild degenerative changes throughout the thoracic spine. No evidence of acute fracture, paraspinal hematoma or widening of the interpedicular distance. IMPRESSION: Stable mild thoracic spine degenerative changes without evidence of acute osseous injury. Electronically Signed   By: Richardean Sale M.D.   On: 07/22/2019 16:11     ASSESSMENT & PLAN:  Shayden was seen today for back pain.  Diagnoses and all orders for this visit:  Upper back pain -     DG Thoracic Spine 2 View; Future -     DG Chest 2 View; Future  Essential hypertension -     amLODipine (NORVASC) 10 MG tablet; Take 1 tablet (10 mg total) by mouth daily.  Musculoskeletal pain -     meloxicam (MOBIC) 15 MG tablet; Take 1 tablet (15 mg total) by mouth daily for 10 days.  Muscle spasm -     cyclobenzaprine (FLEXERIL) 10 MG tablet; Take 1  tablet (10 mg total) by mouth at bedtime.  Bilateral leg edema Comments: Recurrent Orders: -     furosemide (LASIX) 20 MG tablet; Take 1 tablet (20 mg total) by mouth daily as needed.    Patient Instructions       If you have lab work done today you will be contacted with your lab results within the next 2 weeks.  If you have not heard from [place] then please contact us. The fastest way to get your results is to register for My Chart.   IF you received an x-ray today, you will receive an invoice from [facility]. Please contact [facility] at [phone] with questions or concerns regarding your invoice.   IF you received labwork today, you will receive an invoice from [place]. Please contact [facility] at [phone] with questions or concerns regarding your invoice.   Our billing staff will not be able to assist you with questions regarding bills from these companies.  You will be contacted with the lab results as soon as they are available. The fastest way to get your results is to activate your My Chart account. Instructions are located on the last page of this paperwork. If you have not heard from [place] regarding the results in 2 weeks, please contact this office.     Muscle Strain A muscle strain is an injury that happens when a muscle is stretched longer than normal. This can happen during a fall, sports, or lifting. This can tear some muscle fibers. Usually, recovery from muscle strain takes 1-2 weeks. Complete healing normally takes 5-6 weeks. This condition is first treated with PRICE therapy. This involves:  Protecting your muscle from being injured again.  Resting your injured muscle.  Icing your injured muscle.  Applying pressure (compression) to your injured muscle. This may be done with a splint or elastic bandage.  Raising (elevating) your injured muscle. Your doctor may also recommend medicine for pain. Follow  these instructions at home: If you  have a splint:  Wear the splint as told by your doctor. Take it off only as told by your doctor.  Loosen the splint if your fingers or toes tingle, get numb, or turn cold and blue.  Keep the splint clean.  If the splint is not waterproof: ? Do not let it get wet. ? Cover it with a watertight covering when you take a bath or a shower. Managing pain, stiffness, and swelling   If directed, put ice on your injured area. ? If you have a removable splint, take it off as told by your doctor. ? Put ice in a plastic bag. ? Place a towel between your skin and the bag. ? Leave the ice on for 20 minutes, 2-3 times a day.  Move your fingers or toes often. This helps to avoid stiffness and lessen swelling.  Raise your injured area above the level of your heart while you are sitting or lying down.  Wear an elastic bandage as told by your doctor. Make sure it is not too tight. General instructions  Take over-the-counter and prescription medicines only as told by your doctor.  Limit your activity. Rest your injured muscle as told by your doctor. Your doctor may say that gentle movements are okay.  If physical therapy was prescribed, do exercises as told by your doctor.  Do not put pressure on any part of the splint until it is fully hardened. This may take many hours.  Do not use any products that contain nicotine or tobacco, such as cigarettes and e-cigarettes. These can delay bone healing. If you need help quitting, ask your doctor.  Warm up before you exercise. This helps to prevent more muscle strains.  Ask your doctor when it is safe to drive if you have a splint.  Keep all follow-up visits as told by your doctor. This is important. Contact a doctor if:  You have more pain or swelling in your injured area. Get help right away if:  You have any of these problems in your injured area: ? You have numbness. ? You have tingling. ? You lose a lot of strength. Summary  A muscle  strain is an injury that happens when a muscle is stretched longer than normal.  This condition is first treated with PRICE therapy. This includes protecting, resting, icing, adding pressure, and raising your injury.  Limit your activity. Rest your injured muscle as told by your doctor. Your doctor may say that gentle movements are okay.  Warm up before you exercise. This helps to prevent more muscle strains. This information is not intended to replace advice given to you by your health care provider. Make sure you discuss any questions you have with your health care provider. Document Released: 09/13/2008 Document Revised: 01/31/2019 Document Reviewed: 01/11/2017 Elsevier Patient Education  2020 [employer]      Agustina Caroli, MD [facility]

## 2019-07-23 DIAGNOSIS — C61 Malignant neoplasm of prostate: Secondary | ICD-10-CM | POA: Diagnosis not present

## 2019-07-24 ENCOUNTER — Encounter: Payer: Self-pay | Admitting: *Deleted

## 2019-07-29 ENCOUNTER — Other Ambulatory Visit: Payer: Self-pay | Admitting: Emergency Medicine

## 2019-07-29 MED ORDER — LOSARTAN POTASSIUM 100 MG PO TABS: 100.0000 mg | ORAL_TABLET | Freq: Every day | ORAL | 0 refills | Status: DC

## 2019-07-29 NOTE — Telephone Encounter (Signed)
Copied from [place] [phone]. Topic: Quick Communication - Rx Refill/Question >> Jul 29, 2019  4:10 PM Nils Flack wrote: Medication: losartan (COZAAR) 100 MG tablet  Has the patient contacted their pharmacy? No. (Agent: If no, request that the patient contact the pharmacy for the refill.) (Agent: If yes, when and what did the pharmacy advise?)  Preferred Pharmacy (with phone number or street name): [facility]  Agent: Please be advised that RX refills may take up to 3 business days. We ask that you follow-up with your pharmacy.

## 2019-07-30 DIAGNOSIS — G4733 Obstructive sleep apnea (adult) (pediatric): Secondary | ICD-10-CM | POA: Diagnosis not present

## 2019-08-05 ENCOUNTER — Encounter: Payer: Self-pay | Admitting: Radiation Oncology

## 2019-08-05 NOTE — Progress Notes (Signed)
GU Location of Tumor / Histology: prostatic adenocarcinoma  If Prostate Cancer, Gleason Score is (4 + 3) and PSA is (4.7) on 05/2019. Prostate volume: 43 cc. 4/12 cores involved.  Bobby Mcguire was referred by Dr. Mitchel Honour to Dr. Karsten Ro in June 2020 for further evaluation of an elevated PSA noted on routine physical.  Biopsies of prostate (if applicable) revealed:    Past/Anticipated interventions by urology, if any: prostate biopsy, Lupron (awaiting insurance authorization), referral to Dr. Tammi Klippel to discuss radiation therapy options since patient isn't interested in surgery.  Past/Anticipated interventions by medical oncology, if any: no  Weight changes, if any: no  Bowel/Bladder complaints, if any: IPSS 10. SHIM 18. Reports blood in semen. Denies dysuria or hematuria. Reports urinary frequency related to Lasix and fluid volume intake.   Nausea/Vomiting, if any: no  Pain issues, if any:  Arthritic pain in bilateral knees more so LEFT which he take an ibuprofen on occasion to manage.   SAFETY ISSUES:  Prior radiation? no  Pacemaker/ICD? no  Possible current pregnancy? no, male patient  Is the patient on methotrexate? no  Current Complaints / other details:  53 year old male. Married with 2 daughter and 1 son plus a stepchild. Mother with hx of colon ca.

## 2019-08-06 ENCOUNTER — Ambulatory Visit
Admission: RE | Admit: 2019-08-06 | Discharge: 2019-08-06 | Disposition: A | Discharge status: Home / Self Care | Payer: BLUE CROSS/BLUE SHIELD | Source: Ambulatory Visit | Attending: Radiation Oncology | Admitting: Radiation Oncology

## 2019-08-06 ENCOUNTER — Other Ambulatory Visit: Payer: Self-pay

## 2019-08-06 ENCOUNTER — Encounter: Payer: Self-pay | Admitting: Radiation Oncology

## 2019-08-06 VITALS — Ht 75.0 in | Wt 330.0 lb

## 2019-08-06 DIAGNOSIS — C61 Malignant neoplasm of prostate: Secondary | ICD-10-CM

## 2019-08-06 DIAGNOSIS — R972 Elevated prostate specific antigen [PSA]: Secondary | ICD-10-CM | POA: Diagnosis not present

## 2019-08-06 HISTORY — DX: Malignant neoplasm of prostate: C61

## 2019-08-06 NOTE — Progress Notes (Signed)
Radiation Oncology         [phone]) [phone] ________________________________  Initial outpatient Consultation - Conducted via MyChart due to current [id number] concerns for limiting patient exposure  Name: Bobby Mcguire MRN: [id number]  Date: 08/06/2019  DOB: 1966/10/11  [id number], Bobby Bloomer, MD  Kathie Rhodes, MD   REFERRING PHYSICIAN: Kathie Rhodes, MD  DIAGNOSIS: 53 y.o. gentleman with Stage T1c adenocarcinoma of the prostate with Gleason score of 4+3, and PSA of 4.7.    ICD-10-CM   1. Malignant neoplasm of prostate ([place])  C61     HISTORY OF PRESENT ILLNESS: Bobby Mcguire is a 53 y.o. male with a diagnosis of prostate cancer. He was noted to have an elevated PSA of 4.7 by his primary care physician, Dr. Mitchel Honour.  Accordingly, he was referred for evaluation in urology by Dr. Karsten Ro on 06/18/2019,  digital rectal examination was performed at that time revealing no abnormalities although the entire prostate was not palpable due to the patient's body habitus.  The patient proceeded to transrectal ultrasound with 12 biopsies of the prostate on 07/16/2019.  The prostate volume measured 43 cc.  Out of 12 core biopsies, 4 were positive.  The maximum Gleason score was 4+3, and this was seen in left mid lateral and left apex lateral. Small foci of Gleason 3+3 were seen in the left apex and right apex.  Of note, the patient has a past medical history significant for COPD, type II DM and hypertension.  The patient reviewed the biopsy results with his urologist and was adamant that he was not interested in prostatectomy.  They discussed starting short-term androgen deprivation therapy but unfortunately, his insurance required preauthorization so this has not been started as of yet.  He has kindly been referred today for discussion of potential radiation treatment options.  PREVIOUS RADIATION THERAPY: No  PAST MEDICAL HISTORY:  Past Medical History:  Diagnosis Date   Hypertension    Prostate  cancer ([place])    Sleep apnea       PAST SURGICAL HISTORY: Past Surgical History:  Procedure Laterality Date   PROSTATE BIOPSY      FAMILY HISTORY:  Family History  Problem Relation Age of Onset   Colon cancer Mother    Breast cancer Neg Hx    Prostate cancer Neg Hx     SOCIAL HISTORY: He continues working full-time in [employer] for [employer]. Social History   Socioeconomic History   Marital status: Married    Spouse name: Not on file   Number of children: 4   Years of education: Not on file   Highest education level: Not on file  Occupational History   Not on file  Social Needs   Financial resource strain: Not on file   Food insecurity    Worry: Not on file    Inability: Not on file   Transportation needs    Medical: Not on file    Non-medical: Not on file  Tobacco Use   Smoking status: Never Smoker   Smokeless tobacco: Never Used  Substance and Sexual Activity   Alcohol use: Yes    Alcohol/week: 3.0 standard drinks    Types: 3 Glasses of wine per week    Comment: reports he drinks a beer on occasion   Drug use: No   Sexual activity: Yes  Lifestyle   Physical activity    Days per week: Not on file    Minutes per session: Not on file   Stress: Not  on file  Relationships   Social connections    Talks on phone: Not on file    Gets together: Not on file    Attends religious service: Not on file    Active member of club or organization: Not on file    Attends meetings of clubs or organizations: Not on file    Relationship status: Not on file   Intimate partner violence    Fear of current or ex partner: Not on file    Emotionally abused: Not on file    Physically abused: Not on file    Forced sexual activity: Not on file  Other Topics Concern   Not on file  Social History Narrative   3 biological and 1 stepchild    ALLERGIES: No known allergies  MEDICATIONS:  Current Outpatient Medications  Medication Sig Dispense  Refill   amLODipine (NORVASC) 10 MG tablet Take 1 tablet (10 mg total) by mouth daily. 90 tablet 3   cyclobenzaprine (FLEXERIL) 10 MG tablet Take 1 tablet (10 mg total) by mouth at bedtime. 30 tablet 0   furosemide (LASIX) 20 MG tablet Take 1 tablet (20 mg total) by mouth daily as needed. 30 tablet 0   hydrOXYzine (ATARAX/VISTARIL) 25 MG tablet TAKE 1 TABLET THREE TIMES A DAY AS NEEDED 90 tablet 1   ibuprofen (ADVIL,MOTRIN) 800 MG tablet ibuprofen 800 mg tablet  TAKE 2 TABLETS TWICE A DAY     Ibuprofen-Famotidine (DUEXIS) 800-26.6 MG TABS Duexis 800 mg-26.6 mg tablet  Take 1 tablet 3 times a day by oral route.     losartan (COZAAR) 100 MG tablet Take 1 tablet (100 mg total) by mouth daily. 90 tablet 0   Multiple Vitamin (MULTIVITAMIN WITH MINERALS) TABS tablet Take 1 tablet by mouth daily.     mupirocin ointment (BACTROBAN) 2 % Apply 1 application topically 2 (two) times daily. (Patient not taking: Reported on 07/22/2019) 30 g 2   NEOMYCIN-POLYMYXIN-HYDROCORTISONE (CORTISPORIN) 1 % SOLN OTIC solution Apply 1-2 drops to toe BID after soaking (Patient not taking: Reported on 07/22/2019) 10 mL 1   oxyCODONE-acetaminophen (PERCOCET) 10-325 MG tablet Take 1 tablet by mouth every 6 (six) hours as needed for pain. (Patient not taking: Reported on 07/22/2019) 10 tablet 0   rosuvastatin (CRESTOR) 20 MG tablet Take 1 tablet (20 mg total) by mouth daily. 90 tablet 3   sildenafil (VIAGRA) 100 MG tablet TAKE ONE-HALF (1/2) TO ONE TABLET DAILY AS NEEDED FOR ERECTILE DYSFUNCTION 30 tablet 5   No current facility-administered medications for this encounter.     REVIEW OF SYSTEMS:  On review of systems, the patient reports that he is doing well overall. He denies any chest pain, shortness of breath, cough, fevers, chills, night sweats, unintended weight changes. He denies any bowel disturbances, and denies abdominal pain, nausea or vomiting. He denies any new musculoskeletal or joint aches or pains. His  IPSS was 10, indicating moderate urinary symptoms. His SHIM was 18, indicating he has mild erectile dysfunction. A complete review of systems is obtained and is otherwise negative.    PHYSICAL EXAM:  Wt Readings from Last 3 Encounters:  08/06/19 (!) 330 lb (149.7 kg)  07/22/19 (!) 330 lb 9.6 oz (150 kg)  05/30/19 (!) 337 lb 3.2 oz (153 kg)   Temp Readings from Last 3 Encounters:  07/22/19 99.2 F (37.3 C) (Oral)  05/30/19 98.5 F (36.9 C) (Oral)  05/23/19 98.9 F (37.2 C) (Oral)   BP Readings from Last 3 Encounters:  07/22/19 (!) 152/79  05/30/19 (!) 154/72  05/23/19 (!) 142/64   Pulse Readings from Last 3 Encounters:  07/22/19 89  05/30/19 71  05/23/19 90   Pain Assessment Pain Score: 0-No pain/10  In general this is a well appearing African American gentleman in no acute distress. He's alert and oriented x4 and appropriate throughout the examination. Cardiopulmonary assessment is negative for acute distress and he exhibits normal effort.    KPS = 90  100 - Normal; no complaints; no evidence of disease. 90   - Able to carry on normal activity; minor signs or symptoms of disease. 80   - Normal activity with effort; some signs or symptoms of disease. 34   - Cares for self; unable to carry on normal activity or to do active work. 60   - Requires occasional assistance, but is able to care for most of his personal needs. 50   - Requires considerable assistance and frequent medical care. 66   - Disabled; requires special care and assistance. 45   - Severely disabled; hospital admission is indicated although death not imminent. 48   - Very sick; hospital admission necessary; active supportive treatment necessary. 10   - Moribund; fatal processes progressing rapidly. 0     - Dead  Karnofsky DA, Abelmann Diagonal, Craver LS and Burchenal [facility] [phone]) The use of the nitrogen mustards in the palliative treatment of carcinoma: with particular reference to bronchogenic carcinoma Cancer 1  634-56  LABORATORY DATA:  Lab Results  Component Value Date   WBC 9.3 05/23/2019   HGB 14.2 05/23/2019   HCT 42.9 05/23/2019   MCV 99 (H) 05/23/2019   PLT 331 05/23/2019   Lab Results  Component Value Date   NA 139 05/23/2019   K 4.6 05/23/2019   CL 101 05/23/2019   CO2 23 05/23/2019   Lab Results  Component Value Date   ALT 30 05/23/2019   AST 20 05/23/2019   ALKPHOS 65 05/23/2019   BILITOT 0.6 05/23/2019     RADIOGRAPHY: Dg Chest 2 View  Result Date: 07/22/2019 CLINICAL DATA:  Upper back pain. EXAM: CHEST - 2 VIEW COMPARISON:  Chest radiographs 08/05/2018. FINDINGS: Three views obtained. The heart size and mediastinal contours are normal. The lungs are clear. There is no pleural effusion or pneumothorax. No acute osseous findings are identified. Stable thoracic spine degenerative changes. IMPRESSION: Stable chest.  No evidence of active cardiopulmonary process. Electronically Signed   By: Richardean Sale M.D.   On: 07/22/2019 16:12   Dg Thoracic Spine 2 View  Result Date: 07/22/2019 CLINICAL DATA:  Upper back pain. EXAM: THORACIC SPINE 2 VIEWS COMPARISON:  Chest radiographs 08/05/2018. FINDINGS: There are 12 thoracic type vertebral bodies. The alignment is near anatomic. The upper thoracic spine is not well visualized in the lateral projection, and no swimmer's view was obtained. There are stable mild degenerative changes throughout the thoracic spine. No evidence of acute fracture, paraspinal hematoma or widening of the interpedicular distance. IMPRESSION: Stable mild thoracic spine degenerative changes without evidence of acute osseous injury. Electronically Signed   By: Richardean Sale M.D.   On: 07/22/2019 16:11      IMPRESSION/PLAN: 1. 53 y.o. gentleman with Stage T1c adenocarcinoma of the prostate with Gleason Score of 4+3, and PSA of 4.7. This visit was conducted via MyChart to spare the patient unnecessary potential exposure in the healthcare setting during the current  COVID-19 pandemic. We discussed the patient's workup and outlined the nature of prostate cancer  in this setting. The patient's T stage, Gleason's score, and PSA put him into the unfavorable intermediate risk group. Accordingly, he is eligible for a variety of potential treatment options including prostatectomy or ST-ADT in combination with either brachytherapy or 5.5 weeks of external radiation.  He reports that he is not interested in prostatectomy due to the significant downtime for recovery.  We discussed the available radiation techniques, and focused on the details and logistics of delivery. We discussed and outlined the risks, benefits, short and long-term effects associated with radiotherapy and compared and contrasted these with prostatectomy. We discussed the role of SpaceOAR in reducing the rectal toxicity associated with radiotherapy but discussed that it is possible that [place] may not approve the use of this product. We also detailed the role of ADT in the treatment of unfavorable intermediate risk prostate cancer and outlined the associated side effects that could be expected with this therapy.  He was encouraged to ask questions that were answered to his stated satisfaction.  At the end of the conversation the patient is interested in moving forward with 5.5 weeks of external beam therapy in combination with ST-ADT. He has not received his first Lupron injection. We will share our discussion with Dr. Karsten Ro and move forward with coordinating the start of ADT now, followed by placement of fiducial markers +/- SpaceOAR gel to reduce rectal toxicity from radiotherapy, prior to simulation/treatment planning.  It was explained that we would anticipate starting his treatment approximately 8 weeks after the start of ADT to allow for the radiosensitizing effects of this medication.  He appears to have a good understanding of his disease and our recommendations for treatment which are of curative intent.  He  is comfortable and in agreement with the stated plan.  Given current concerns for patient exposure during the COVID-19 pandemic, this encounter was conducted via video-enabled MyChart visit. The patient has given verbal consent for this type of encounter. The time spent during this encounter was 55 minutes. The attendants for this meeting include Tyler Pita MD, Ashlyn Bruning PA-C, [place], and patient Miron. During the encounter, Tyler Pita MD, Ashlyn Bruning PA-C, and scribe, Wilburn Mylar were located at [place].  Patient Robel was located at home.   [name], PA-C    Tyler Pita, MD  [facility] Direct Dial: [phone]   Fax: [phone] [email or web address]   Skype   [email or web address]   This document serves as a record of services personally performed by Tyler Pita, MD and Freeman Caldron, PA-C. It was created on their behalf by Wilburn Mylar, a trained medical scribe. The creation of this record is based on the scribe's personal observations and the provider's statements to them. This document has been checked and approved by the attending provider.

## 2019-08-07 ENCOUNTER — Telehealth: Payer: Self-pay | Admitting: Radiation Oncology

## 2019-08-07 NOTE — Telephone Encounter (Signed)
Mailed blank advance directive packet to patient as he requested. Included contact numbers for Harriett Sine and Webb [place], [place] social workers.

## 2019-08-13 ENCOUNTER — Telehealth: Payer: Self-pay | Admitting: Medical Oncology

## 2019-08-13 NOTE — Telephone Encounter (Signed)
Spoke with patient to introduce myself as the nurse navigator and discuss my role. I was unable to meet him 8/18 when he consulted with Dr. Tammi Klippel. He has chosen ST- ADT and 8 weeks of radiation. He is scheduled for  ADT 8/27 at [facility] and he confirms this appointment. I reviewed side effects of ADT and informed him he will be  scheduled for CT simulation and radiation in approx 8 weeks. He had questions about taking time off work. I asked him to bring FMLA forms and our office will complete them. I gave him my office number and asked him to call me with questions or concerns. He voiced understanding.

## 2019-08-15 DIAGNOSIS — C61 Malignant neoplasm of prostate: Secondary | ICD-10-CM | POA: Diagnosis not present

## 2019-08-16 ENCOUNTER — Telehealth: Payer: Self-pay | Admitting: Medical Oncology

## 2019-08-16 ENCOUNTER — Encounter: Payer: Self-pay | Admitting: Medical Oncology

## 2019-08-16 NOTE — Telephone Encounter (Signed)
Bobby Mcguire called stating he has FMLA forms that need to be completed.  He took forms to [facility] yesterday but was instructed Dr. Tammi Klippel will need to complete. He receive Lupron yesterday and after reading side effects, he would like for FMLA to start yesterday. I discussed the side effects of Lupron and discussed that he should be able to work. He is aware that radiation will not begin for approx 8 weeks. If possible, he would like to take 3 months off during this process. He brought forms and I submitted them. I informed him that he will contacted once they are completed. He voiced understanding.

## 2019-08-20 ENCOUNTER — Encounter: Payer: Self-pay | Admitting: Urology

## 2019-08-20 NOTE — Progress Notes (Signed)
Patient started ADT 08/15/19 so I have sent an inbox request to Romie Jumper to move forward with coordinating fiducial markers and SpaceOAR gel with Dr. Karsten Ro in early-mid October followed by CT [facility] in preparation to begin his radiation  treatments the week of Oct. 26th.  [name], MMS, PA-C [place] [facility] [place]: [phone]  Fax: [phone]

## 2019-08-30 DIAGNOSIS — G4733 Obstructive sleep apnea (adult) (pediatric): Secondary | ICD-10-CM | POA: Diagnosis not present

## 2019-09-23 ENCOUNTER — Encounter: Payer: Self-pay | Admitting: Emergency Medicine

## 2019-09-23 ENCOUNTER — Encounter: Payer: Self-pay | Admitting: Radiation Oncology

## 2019-09-24 ENCOUNTER — Other Ambulatory Visit: Payer: Self-pay | Admitting: Urology

## 2019-09-24 DIAGNOSIS — C61 Malignant neoplasm of prostate: Secondary | ICD-10-CM

## 2019-09-25 ENCOUNTER — Other Ambulatory Visit: Payer: Self-pay | Admitting: Urology

## 2019-09-26 ENCOUNTER — Other Ambulatory Visit (HOSPITAL_COMMUNITY): Payer: Self-pay | Admitting: Urology

## 2019-09-26 DIAGNOSIS — C61 Malignant neoplasm of prostate: Secondary | ICD-10-CM

## 2019-09-26 NOTE — Telephone Encounter (Signed)
See 09/26/2019 message.  System would not allow direct response to patient's message due to use of asterisks.

## 2019-09-29 DIAGNOSIS — G4733 Obstructive sleep apnea (adult) (pediatric): Secondary | ICD-10-CM | POA: Diagnosis not present

## 2019-10-01 ENCOUNTER — Encounter: Payer: Self-pay | Admitting: Medical Oncology

## 2019-10-01 ENCOUNTER — Telehealth: Payer: Self-pay | Admitting: Medical Oncology

## 2019-10-01 ENCOUNTER — Encounter: Payer: Self-pay | Admitting: Radiation Oncology

## 2019-10-01 ENCOUNTER — Encounter: Payer: Self-pay | Admitting: Urology

## 2019-10-01 NOTE — Progress Notes (Signed)
Spoke with Thompson Caul regarding FMLA. She will complete and call patient.

## 2019-10-01 NOTE — Progress Notes (Signed)
Patient was scheduled for gold seed/fiducial marker placement but has refused to have this procedure.  I have confirmed with Dr. Tammi Klippel that he is fine with moving forward with CT simulation/treatment planning without the fiducial markers/spaceOAR gel.  Romie Jumper will contact the patient and we will proceed with CT simulation as planned on 10/11/2019.  He will need urethral contrast at the time of CT Sim.  [name], MMS, PA-C [place] [facility] [place]: [phone]  Fax: [phone]

## 2019-10-01 NOTE — Telephone Encounter (Signed)
Patient called stating he received a call from the surgical center to set up date for seed implant. He is concerned because he thought he was doing radiation not surgery. I explained he is getting fiducial markers ( 3 gold seeds) and SpaceOar. He states he does not remember discussing this during his consult. I explained the purpose of the seeds and Space Oar gel. He voiced understanding and will call the center back to confirm appointment. He would like me to follow up on his FMLA forms that he dropped off 08/16/19. He has not be contacted and is not sure if they have been submitted. I will follow up. He is complaining about hot flashes and has not been sleeping well which are related to ADT. He confirmed CT simulation 10/23. I asked him to call me back with questions or concerns.

## 2019-10-07 ENCOUNTER — Encounter (HOSPITAL_BASED_OUTPATIENT_CLINIC_OR_DEPARTMENT_OTHER): Admission: RE | Payer: Self-pay | Source: Home / Self Care

## 2019-10-07 ENCOUNTER — Ambulatory Visit (HOSPITAL_COMMUNITY): Payer: BC Managed Care – PPO

## 2019-10-07 ENCOUNTER — Ambulatory Visit (HOSPITAL_BASED_OUTPATIENT_CLINIC_OR_DEPARTMENT_OTHER): Admission: RE | Admit: 2019-10-07 | Payer: BC Managed Care – PPO | Source: Home / Self Care | Admitting: Urology

## 2019-10-07 ENCOUNTER — Encounter (HOSPITAL_COMMUNITY): Payer: Self-pay

## 2019-10-07 SURGERY — INSERTION, GOLD SEEDS
Anesthesia: General

## 2019-10-08 ENCOUNTER — Encounter: Payer: Self-pay | Admitting: Emergency Medicine

## 2019-10-08 ENCOUNTER — Other Ambulatory Visit: Payer: Self-pay | Admitting: Emergency Medicine

## 2019-10-09 ENCOUNTER — Other Ambulatory Visit: Payer: Self-pay | Admitting: *Deleted

## 2019-10-09 DIAGNOSIS — E785 Hyperlipidemia, unspecified: Secondary | ICD-10-CM

## 2019-10-09 MED ORDER — ROSUVASTATIN CALCIUM 20 MG PO TABS: 20.0000 mg | ORAL_TABLET | Freq: Every day | ORAL | 1 refills | Status: DC

## 2019-10-10 ENCOUNTER — Telehealth: Payer: Self-pay | Admitting: Medical Oncology

## 2019-10-10 ENCOUNTER — Telehealth: Payer: Self-pay | Admitting: *Deleted

## 2019-10-10 NOTE — Telephone Encounter (Signed)
Called patient to remind of sim appt. for 10-11-19 - arrival time- 12:45 pm @ Dr. Johny Shears Office, spoke with patient and Bobby Mcguire is aware of this appt.

## 2019-10-10 NOTE — Telephone Encounter (Signed)
Called patient to inform FMLA paperwork was completed and faxed to his employer and a copy mailed to him. He is scheduled for CT simulation tomorrow and he confirmed appointment. He had questions about the simulation and all were answered. He was very appreciative of my call and I asked him to call me if I can help him in any way.

## 2019-10-11 ENCOUNTER — Other Ambulatory Visit: Payer: Self-pay

## 2019-10-11 ENCOUNTER — Ambulatory Visit
Admission: RE | Admit: 2019-10-11 | Discharge: 2019-10-11 | Disposition: A | Discharge status: Home / Self Care | Payer: BC Managed Care – PPO | Source: Ambulatory Visit | Attending: Radiation Oncology | Admitting: Radiation Oncology

## 2019-10-11 DIAGNOSIS — C61 Malignant neoplasm of prostate: Secondary | ICD-10-CM | POA: Insufficient documentation

## 2019-10-11 MED ORDER — LOSARTAN POTASSIUM 100 MG PO TABS: 100.0000 mg | ORAL_TABLET | Freq: Every day | ORAL | 0 refills | Status: DC

## 2019-10-14 ENCOUNTER — Encounter: Payer: Self-pay | Admitting: Medical Oncology

## 2019-10-14 DIAGNOSIS — C61 Malignant neoplasm of prostate: Secondary | ICD-10-CM | POA: Diagnosis not present

## 2019-10-14 NOTE — Progress Notes (Signed)
  Radiation Oncology         [phone] ________________________________  Name: Bobby Mcguire MRN: [id number]  Date: 10/11/2019  DOB: 1966-04-25  SIMULATION AND TREATMENT PLANNING NOTE    ICD-10-CM   1. Malignant neoplasm of prostate ([place])  C61     DIAGNOSIS:  53 y.o. gentleman with Stage T1c adenocarcinoma of the prostate with Gleason score of 4+3, and PSA of 4.7.  NARRATIVE:  The patient was brought to the [facility].  Identity was confirmed.  All relevant records and images related to the planned course of therapy were reviewed.  The patient freely provided informed written consent to proceed with treatment after reviewing the details related to the planned course of therapy. The consent form was witnessed and verified by the simulation staff.  Then, the patient was set-up in a stable reproducible supine position for radiation therapy.  A vacuum lock pillow device was custom fabricated to position his legs in a reproducible immobilized position.  Then, I performed a urethrogram under sterile conditions to identify the prostatic apex.  CT images were obtained.  Surface markings were placed.  The CT images were loaded into the planning software.  Then the prostate target and avoidance structures including the rectum, bladder, bowel and hips were contoured.  Treatment planning then occurred.  The radiation prescription was entered and confirmed.  A total of one complex treatment devices was fabricated. I have requested : Intensity Modulated Radiotherapy (IMRT) is medically necessary for this case for the following reason:  Rectal sparing.Marland Kitchen  PLAN:  The patient will receive 70 Gy in 28 fractions.  ________________________________  [name], M.D.

## 2019-10-22 ENCOUNTER — Other Ambulatory Visit: Payer: Self-pay

## 2019-10-22 ENCOUNTER — Encounter: Payer: Self-pay | Admitting: Medical Oncology

## 2019-10-22 ENCOUNTER — Ambulatory Visit
Admission: RE | Admit: 2019-10-22 | Discharge: 2019-10-22 | Disposition: A | Discharge status: Home / Self Care | Payer: BC Managed Care – PPO | Source: Ambulatory Visit | Attending: Radiation Oncology | Admitting: Radiation Oncology

## 2019-10-22 DIAGNOSIS — C61 Malignant neoplasm of prostate: Secondary | ICD-10-CM | POA: Diagnosis not present

## 2019-10-23 ENCOUNTER — Ambulatory Visit
Admission: RE | Admit: 2019-10-23 | Discharge: 2019-10-23 | Disposition: A | Discharge status: Home / Self Care | Payer: BC Managed Care – PPO | Source: Ambulatory Visit | Attending: Radiation Oncology | Admitting: Radiation Oncology

## 2019-10-23 ENCOUNTER — Other Ambulatory Visit: Payer: Self-pay

## 2019-10-23 DIAGNOSIS — C61 Malignant neoplasm of prostate: Secondary | ICD-10-CM | POA: Diagnosis not present

## 2019-10-24 ENCOUNTER — Ambulatory Visit
Admission: RE | Admit: 2019-10-24 | Discharge: 2019-10-24 | Disposition: A | Discharge status: Home / Self Care | Payer: BC Managed Care – PPO | Source: Ambulatory Visit | Attending: Radiation Oncology | Admitting: Radiation Oncology

## 2019-10-24 ENCOUNTER — Other Ambulatory Visit: Payer: Self-pay

## 2019-10-24 DIAGNOSIS — C61 Malignant neoplasm of prostate: Secondary | ICD-10-CM | POA: Diagnosis not present

## 2019-10-25 ENCOUNTER — Ambulatory Visit
Admission: RE | Admit: 2019-10-25 | Discharge: 2019-10-25 | Disposition: A | Discharge status: Home / Self Care | Payer: BC Managed Care – PPO | Source: Ambulatory Visit | Attending: Radiation Oncology | Admitting: Radiation Oncology

## 2019-10-25 ENCOUNTER — Other Ambulatory Visit: Payer: Self-pay

## 2019-10-25 DIAGNOSIS — C61 Malignant neoplasm of prostate: Secondary | ICD-10-CM | POA: Diagnosis not present

## 2019-10-28 ENCOUNTER — Other Ambulatory Visit: Payer: Self-pay

## 2019-10-28 ENCOUNTER — Ambulatory Visit
Admission: RE | Admit: 2019-10-28 | Discharge: 2019-10-28 | Disposition: A | Discharge status: Home / Self Care | Payer: BC Managed Care – PPO | Source: Ambulatory Visit | Attending: Radiation Oncology | Admitting: Radiation Oncology

## 2019-10-28 DIAGNOSIS — C61 Malignant neoplasm of prostate: Secondary | ICD-10-CM | POA: Diagnosis not present

## 2019-10-29 ENCOUNTER — Other Ambulatory Visit: Payer: Self-pay

## 2019-10-29 ENCOUNTER — Ambulatory Visit
Admission: RE | Admit: 2019-10-29 | Discharge: 2019-10-29 | Disposition: A | Discharge status: Home / Self Care | Payer: BC Managed Care – PPO | Source: Ambulatory Visit | Attending: Radiation Oncology | Admitting: Radiation Oncology

## 2019-10-29 DIAGNOSIS — C61 Malignant neoplasm of prostate: Secondary | ICD-10-CM | POA: Diagnosis not present

## 2019-10-30 ENCOUNTER — Ambulatory Visit
Admission: RE | Admit: 2019-10-30 | Discharge: 2019-10-30 | Disposition: A | Discharge status: Home / Self Care | Payer: BC Managed Care – PPO | Source: Ambulatory Visit | Attending: Radiation Oncology | Admitting: Radiation Oncology

## 2019-10-30 ENCOUNTER — Other Ambulatory Visit: Payer: Self-pay

## 2019-10-30 DIAGNOSIS — C61 Malignant neoplasm of prostate: Secondary | ICD-10-CM | POA: Diagnosis not present

## 2019-10-30 DIAGNOSIS — G4733 Obstructive sleep apnea (adult) (pediatric): Secondary | ICD-10-CM | POA: Diagnosis not present

## 2019-10-31 ENCOUNTER — Other Ambulatory Visit: Payer: Self-pay

## 2019-10-31 ENCOUNTER — Ambulatory Visit
Admission: RE | Admit: 2019-10-31 | Discharge: 2019-10-31 | Disposition: A | Discharge status: Home / Self Care | Payer: BC Managed Care – PPO | Source: Ambulatory Visit | Attending: Radiation Oncology | Admitting: Radiation Oncology

## 2019-10-31 DIAGNOSIS — C61 Malignant neoplasm of prostate: Secondary | ICD-10-CM | POA: Diagnosis not present

## 2019-11-01 ENCOUNTER — Ambulatory Visit
Admission: RE | Admit: 2019-11-01 | Discharge: 2019-11-01 | Disposition: A | Discharge status: Home / Self Care | Payer: BC Managed Care – PPO | Source: Ambulatory Visit | Attending: Radiation Oncology | Admitting: Radiation Oncology

## 2019-11-01 ENCOUNTER — Other Ambulatory Visit: Payer: Self-pay

## 2019-11-01 DIAGNOSIS — C61 Malignant neoplasm of prostate: Secondary | ICD-10-CM | POA: Diagnosis not present

## 2019-11-04 ENCOUNTER — Other Ambulatory Visit: Payer: Self-pay

## 2019-11-04 ENCOUNTER — Ambulatory Visit
Admission: RE | Admit: 2019-11-04 | Discharge: 2019-11-04 | Disposition: A | Discharge status: Home / Self Care | Payer: BC Managed Care – PPO | Source: Ambulatory Visit | Attending: Radiation Oncology | Admitting: Radiation Oncology

## 2019-11-04 DIAGNOSIS — C61 Malignant neoplasm of prostate: Secondary | ICD-10-CM | POA: Diagnosis not present

## 2019-11-05 ENCOUNTER — Ambulatory Visit
Admission: RE | Admit: 2019-11-05 | Discharge: 2019-11-05 | Disposition: A | Discharge status: Home / Self Care | Payer: BC Managed Care – PPO | Source: Ambulatory Visit | Attending: Radiation Oncology | Admitting: Radiation Oncology

## 2019-11-05 ENCOUNTER — Other Ambulatory Visit: Payer: Self-pay

## 2019-11-05 DIAGNOSIS — C61 Malignant neoplasm of prostate: Secondary | ICD-10-CM | POA: Diagnosis not present

## 2019-11-06 ENCOUNTER — Ambulatory Visit
Admission: RE | Admit: 2019-11-06 | Discharge: 2019-11-06 | Disposition: A | Discharge status: Home / Self Care | Payer: BC Managed Care – PPO | Source: Ambulatory Visit | Attending: Radiation Oncology | Admitting: Radiation Oncology

## 2019-11-06 ENCOUNTER — Other Ambulatory Visit: Payer: Self-pay

## 2019-11-06 DIAGNOSIS — G4733 Obstructive sleep apnea (adult) (pediatric): Secondary | ICD-10-CM | POA: Diagnosis not present

## 2019-11-06 DIAGNOSIS — C61 Malignant neoplasm of prostate: Secondary | ICD-10-CM | POA: Diagnosis not present

## 2019-11-07 ENCOUNTER — Other Ambulatory Visit: Payer: Self-pay

## 2019-11-07 ENCOUNTER — Ambulatory Visit
Admission: RE | Admit: 2019-11-07 | Discharge: 2019-11-07 | Disposition: A | Discharge status: Home / Self Care | Payer: BC Managed Care – PPO | Source: Ambulatory Visit | Attending: Radiation Oncology | Admitting: Radiation Oncology

## 2019-11-07 DIAGNOSIS — C61 Malignant neoplasm of prostate: Secondary | ICD-10-CM | POA: Diagnosis not present

## 2019-11-08 ENCOUNTER — Ambulatory Visit
Admission: RE | Admit: 2019-11-08 | Discharge: 2019-11-08 | Disposition: A | Discharge status: Home / Self Care | Payer: BC Managed Care – PPO | Source: Ambulatory Visit | Attending: Radiation Oncology | Admitting: Radiation Oncology

## 2019-11-08 ENCOUNTER — Other Ambulatory Visit: Payer: Self-pay

## 2019-11-08 DIAGNOSIS — C61 Malignant neoplasm of prostate: Secondary | ICD-10-CM | POA: Diagnosis not present

## 2019-11-10 ENCOUNTER — Ambulatory Visit
Admission: RE | Admit: 2019-11-10 | Discharge: 2019-11-10 | Disposition: A | Discharge status: Home / Self Care | Payer: BC Managed Care – PPO | Source: Ambulatory Visit | Attending: Radiation Oncology | Admitting: Radiation Oncology

## 2019-11-10 ENCOUNTER — Other Ambulatory Visit: Payer: Self-pay

## 2019-11-10 DIAGNOSIS — C61 Malignant neoplasm of prostate: Secondary | ICD-10-CM | POA: Diagnosis not present

## 2019-11-11 ENCOUNTER — Other Ambulatory Visit: Payer: Self-pay

## 2019-11-11 ENCOUNTER — Ambulatory Visit
Admission: RE | Admit: 2019-11-11 | Discharge: 2019-11-11 | Disposition: A | Discharge status: Home / Self Care | Payer: BC Managed Care – PPO | Source: Ambulatory Visit | Attending: Radiation Oncology | Admitting: Radiation Oncology

## 2019-11-11 DIAGNOSIS — C61 Malignant neoplasm of prostate: Secondary | ICD-10-CM | POA: Diagnosis not present

## 2019-11-12 ENCOUNTER — Ambulatory Visit
Admission: RE | Admit: 2019-11-12 | Discharge: 2019-11-12 | Disposition: A | Discharge status: Home / Self Care | Payer: BC Managed Care – PPO | Source: Ambulatory Visit | Attending: Radiation Oncology | Admitting: Radiation Oncology

## 2019-11-12 ENCOUNTER — Other Ambulatory Visit: Payer: Self-pay

## 2019-11-12 DIAGNOSIS — C61 Malignant neoplasm of prostate: Secondary | ICD-10-CM | POA: Diagnosis not present

## 2019-11-13 ENCOUNTER — Other Ambulatory Visit: Payer: Self-pay

## 2019-11-13 ENCOUNTER — Ambulatory Visit
Admission: RE | Admit: 2019-11-13 | Discharge: 2019-11-13 | Disposition: A | Discharge status: Home / Self Care | Payer: BC Managed Care – PPO | Source: Ambulatory Visit | Attending: Radiation Oncology | Admitting: Radiation Oncology

## 2019-11-13 DIAGNOSIS — C61 Malignant neoplasm of prostate: Secondary | ICD-10-CM | POA: Diagnosis not present

## 2019-11-18 ENCOUNTER — Ambulatory Visit
Admission: RE | Admit: 2019-11-18 | Discharge: 2019-11-18 | Disposition: A | Discharge status: Home / Self Care | Payer: BC Managed Care – PPO | Source: Ambulatory Visit | Attending: Radiation Oncology | Admitting: Radiation Oncology

## 2019-11-18 ENCOUNTER — Other Ambulatory Visit: Payer: Self-pay

## 2019-11-18 DIAGNOSIS — C61 Malignant neoplasm of prostate: Secondary | ICD-10-CM | POA: Diagnosis not present

## 2019-11-19 ENCOUNTER — Ambulatory Visit (INDEPENDENT_AMBULATORY_CARE_PROVIDER_SITE_OTHER): Payer: BC Managed Care – PPO | Admitting: Emergency Medicine

## 2019-11-19 ENCOUNTER — Ambulatory Visit
Admission: RE | Admit: 2019-11-19 | Discharge: 2019-11-19 | Disposition: A | Discharge status: Home / Self Care | Payer: BC Managed Care – PPO | Source: Ambulatory Visit | Attending: Radiation Oncology | Admitting: Radiation Oncology

## 2019-11-19 ENCOUNTER — Encounter: Payer: Self-pay | Admitting: Emergency Medicine

## 2019-11-19 ENCOUNTER — Other Ambulatory Visit: Payer: Self-pay

## 2019-11-19 VITALS — BP 158/74 | HR 83 | Temp 97.2°F | Resp 16 | Ht 75.0 in | Wt 324.6 lb

## 2019-11-19 DIAGNOSIS — M170 Bilateral primary osteoarthritis of knee: Secondary | ICD-10-CM

## 2019-11-19 DIAGNOSIS — C61 Malignant neoplasm of prostate: Secondary | ICD-10-CM | POA: Insufficient documentation

## 2019-11-19 DIAGNOSIS — I10 Essential (primary) hypertension: Secondary | ICD-10-CM

## 2019-11-19 NOTE — Progress Notes (Signed)
Bobby Mcguire 53 y.o.   Chief Complaint  Patient presents with   Hypertension    follow up 6 months   Joint Swelling    Left x  2 months    HISTORY OF PRESENT ILLNESS: This is a 53 y.o. male here for follow-up: 1.  Hypertension: On amlodipine and losartan. 2.  Dyslipidemia: On Crestor 20 mg daily. 3.  Prostate cancer: Getting radiation treatment.  Doing well. 4.  Osteoarthritis of both knees. 5.  Morbid obesity 6.  Obstructive sleep apnea Today complaining of left knee swelling for 2 months.  Inquiring about aspiration.  HPI   Prior to Admission medications   Medication Sig Start Date End Date Taking? Authorizing Provider  ibuprofen (ADVIL,MOTRIN) 800 MG tablet ibuprofen 800 mg tablet  TAKE 2 TABLETS TWICE A DAY   Yes [provider]  losartan (COZAAR) 100 MG tablet Take 1 tablet (100 mg total) by mouth daily. 10/11/19  Yes Ranya Fiddler, Ines Bloomer, MD  rosuvastatin (CRESTOR) 20 MG tablet Take 1 tablet (20 mg total) by mouth daily. 10/09/19  Yes Taura Lamarre, Ines Bloomer, MD  sildenafil (VIAGRA) 100 MG tablet TAKE ONE-HALF (1/2) TO ONE TABLET DAILY AS NEEDED FOR ERECTILE DYSFUNCTION 06/06/19  Yes Karthikeya Funke, Ines Bloomer, MD  amLODipine (NORVASC) 10 MG tablet Take 1 tablet (10 mg total) by mouth daily. 07/22/19 10/20/19  [name], MD    Allergies  Allergen Reactions   No Known Allergies     Patient Active Problem List   Diagnosis Date Noted   Malignant neoplasm of prostate ([place]) 08/06/2019   Morbid obesity with body mass index (BMI) of 40.0 to 49.9 ([place]) 02/13/2019   Osteoarthritis of knee 01/09/2018   Erectile dysfunction 09/19/2017   Essential hypertension 06/22/2017   Severe obstructive sleep apnea-hypopnea syndrome 05/07/2010    Past Medical History:  Diagnosis Date   Hypertension    Prostate cancer ([place])    Sleep apnea     Past Surgical History:  Procedure Laterality Date   PROSTATE BIOPSY      Social History   Socioeconomic  History   Marital status: Married    Spouse name: Not on file   Number of children: 4   Years of education: Not on file   Highest education level: Not on file  Occupational History   Not on file  Social Needs   Financial resource strain: Not on file   Food insecurity    Worry: Not on file    Inability: Not on file   Transportation needs    Medical: Not on file    Non-medical: Not on file  Tobacco Use   Smoking status: Never Smoker   Smokeless tobacco: Never Used  Substance and Sexual Activity   Alcohol use: Yes    Alcohol/week: 3.0 standard drinks    Types: 3 Glasses of wine per week    Comment: reports he drinks a beer on occasion   Drug use: No   Sexual activity: Yes  Lifestyle   Physical activity    Days per week: Not on file    Minutes per session: Not on file   Stress: Not on file  Relationships   Social connections    Talks on phone: Not on file    Gets together: Not on file    Attends religious service: Not on file    Active member of club or organization: Not on file    Attends meetings of clubs or organizations: Not on file  Relationship status: Not on file   Intimate partner violence    Fear of current or ex partner: Not on file    Emotionally abused: Not on file    Physically abused: Not on file    Forced sexual activity: Not on file  Other Topics Concern   Not on file  Social History Narrative   3 biological and 1 stepchild    Family History  Problem Relation Age of Onset   Colon cancer Mother    Breast cancer Neg Hx    Prostate cancer Neg Hx      Review of Systems  Constitutional: Negative.  Negative for chills and fever.  HENT: Negative.  Negative for congestion and sore throat.   Respiratory: Negative.  Negative for cough and shortness of breath.   Cardiovascular: Negative.  Negative for chest pain and palpitations.  Gastrointestinal: Negative.  Negative for abdominal pain, diarrhea, nausea and vomiting.    Genitourinary: Negative.  Negative for dysuria and hematuria.  Musculoskeletal: Negative for myalgias.  Skin: Negative.  Negative for rash.  Neurological: Negative.  Negative for dizziness and headaches.  All other systems reviewed and are negative.  Today's Vitals   11/19/19 0812  BP: (!) 158/74  Pulse: 83  Resp: 16  Temp: (!) 97.2 F (36.2 C)  TempSrc: Oral  SpO2: 99%  Weight: (!) 324 lb 9.6 oz (147.2 kg)  Height: 6\' 3"  (1.905 m)   Body mass index is 40.57 kg/m.   Physical Exam Vitals signs reviewed.  Constitutional:      Appearance: Normal appearance.  HENT:     Head: Normocephalic.  Eyes:     Extraocular Movements: Extraocular movements intact.     Conjunctiva/sclera: Conjunctivae normal.     Pupils: Pupils are equal, round, and reactive to light.  Neck:     Musculoskeletal: Normal range of motion and neck supple.  Cardiovascular:     Rate and Rhythm: Normal rate and regular rhythm.     Pulses: Normal pulses.     Heart sounds: Normal heart sounds.  Pulmonary:     Effort: Pulmonary effort is normal.     Breath sounds: Normal breath sounds.  Abdominal:     Palpations: Abdomen is soft.     Tenderness: There is no abdominal tenderness.  Musculoskeletal: Normal range of motion.  Skin:    General: Skin is warm and dry.     Capillary Refill: Capillary refill takes less than 2 seconds.  Neurological:     General: No focal deficit present.     Mental Status: He is alert and oriented to person, place, and time.  Psychiatric:        Mood and Affect: Mood normal.        Behavior: Behavior normal.    A total of 25 minutes was spent in the room with the patient, greater than 50% of which was in counseling/coordination of care regarding chronic medical problems, medications, management and treatment, review of most recent lab work, diet and nutrition, prognosis, and need for follow-up.   ASSESSMENT & PLAN: Clinically stable.  No medical concerns identified during  this visit.  Continue present medications.  No changes.  Follow-up in 6 months.  Wesson was seen today for hypertension and joint swelling.  Diagnoses and all orders for this visit:  Essential hypertension  Malignant neoplasm of prostate ([place])  Morbid obesity with body mass index (BMI) of 40.0 to 49.9 ([facility])  Osteoarthritis of both knees, unspecified osteoarthritis type  Patient Instructions       If you have lab work done today you will be contacted with your lab results within the next 2 weeks.  If you have not heard from [place] then please contact us. The fastest way to get your results is to register for My Chart.   IF you received an x-ray today, you will receive an invoice from [facility]. Please contact [facility] at [phone] with questions or concerns regarding your invoice.   IF you received labwork today, you will receive an invoice from [facility]. Please contact [facility] at [phone] with questions or concerns regarding your invoice.   Our billing staff will not be able to assist you with questions regarding bills from these companies.  You will be contacted with the lab results as soon as they are available. The fastest way to get your results is to activate your My Chart account. Instructions are located on the last page of this paperwork. If you have not heard from [place] regarding the results in 2 weeks, please contact this office.     Hypertension, Adult High blood pressure (hypertension) is when the force of blood pumping through the arteries is too strong. The arteries are the blood vessels that carry blood from the heart throughout the body. Hypertension forces the heart to work harder to pump blood and may cause arteries to become narrow or stiff. Untreated or uncontrolled hypertension can cause a heart attack, heart failure, a stroke, kidney disease, and other problems. A blood pressure reading consists of a higher number over a lower  number. Ideally, your blood pressure should be below 120/80. The first ("top") number is called the systolic pressure. It is a measure of the pressure in your arteries as your heart beats. The second ("bottom") number is called the diastolic pressure. It is a measure of the pressure in your arteries as the heart relaxes. What are the causes? The exact cause of this condition is not known. There are some conditions that result in or are related to high blood pressure. What increases the risk? Some risk factors for high blood pressure are under your control. The following factors may make you more likely to develop this condition:  Smoking.  Having type 2 diabetes mellitus, high cholesterol, or both.  Not getting enough exercise or physical activity.  Being overweight.  Having too much fat, sugar, calories, or salt (sodium) in your diet.  Drinking too much alcohol. Some risk factors for high blood pressure may be difficult or impossible to change. Some of these factors include:  Having chronic kidney disease.  Having a family history of high blood pressure.  Age. Risk increases with age.  Race. You may be at higher risk if you are African American.  Gender. Men are at higher risk than women before age 38. After age 68, women are at higher risk than men.  Having obstructive sleep apnea.  Stress. What are the signs or symptoms? High blood pressure may not cause symptoms. Very high blood pressure (hypertensive crisis) may cause:  Headache.  Anxiety.  Shortness of breath.  Nosebleed.  Nausea and vomiting.  Vision changes.  Severe chest pain.  Seizures. How is this diagnosed? This condition is diagnosed by measuring your blood pressure while you are seated, with your arm resting on a flat surface, your legs uncrossed, and your feet flat on the floor. The cuff of the blood pressure monitor will be placed directly against the skin of your upper arm at  the level of your  heart. It should be measured at least twice using the same arm. Certain conditions can cause a difference in blood pressure between your right and left arms. Certain factors can cause blood pressure readings to be lower or higher than normal for a short period of time:  When your blood pressure is higher when you are in a health care provider's office than when you are at home, this is called white coat hypertension. Most people with this condition do not need medicines.  When your blood pressure is higher at home than when you are in a health care provider's office, this is called masked hypertension. Most people with this condition may need medicines to control blood pressure. If you have a high blood pressure reading during one visit or you have normal blood pressure with other risk factors, you may be asked to:  Return on a different day to have your blood pressure checked again.  Monitor your blood pressure at home for 1 week or longer. If you are diagnosed with hypertension, you may have other blood or imaging tests to help your health care provider understand your overall risk for other conditions. How is this treated? This condition is treated by making healthy lifestyle changes, such as eating healthy foods, exercising more, and reducing your alcohol intake. Your health care provider may prescribe medicine if lifestyle changes are not enough to get your blood pressure under control, and if:  Your systolic blood pressure is above 130.  Your diastolic blood pressure is above 80. Your personal target blood pressure may vary depending on your medical conditions, your age, and other factors. Follow these instructions at home: Eating and drinking   Eat a diet that is high in fiber and potassium, and low in sodium, added sugar, and fat. An example eating plan is called the DASH (Dietary Approaches to Stop Hypertension) diet. To eat this way: ? Eat plenty of fresh fruits and vegetables. Try  to fill one half of your plate at each meal with fruits and vegetables. ? Eat whole grains, such as whole-wheat pasta, brown rice, or whole-grain bread. Fill about one fourth of your plate with whole grains. ? Eat or drink low-fat dairy products, such as skim milk or low-fat yogurt. ? Avoid fatty cuts of meat, processed or cured meats, and poultry with skin. Fill about one fourth of your plate with lean proteins, such as fish, chicken without skin, beans, eggs, or tofu. ? Avoid pre-made and processed foods. These tend to be higher in sodium, added sugar, and fat.  Reduce your daily sodium intake. Most people with hypertension should eat less than 1,500 mg of sodium a day.  Do not drink alcohol if: ? Your health care provider tells you not to drink. ? You are pregnant, may be pregnant, or are planning to become pregnant.  If you drink alcohol: ? Limit how much you use to:  0-1 drink a day for women.  0-2 drinks a day for men. ? Be aware of how much alcohol is in your drink. In the [place] one drink equals one 12 oz bottle of beer (355 mL), one 5 oz glass of wine (148 mL), or one 1 oz glass of hard liquor (44 mL). Lifestyle   Work with your health care provider to maintain a healthy body weight or to lose weight. Ask what an ideal weight is for you.  Get at least 30 minutes of exercise most days of the week. Activities  may include walking, swimming, or biking.  Include exercise to strengthen your muscles (resistance exercise), such as Pilates or lifting weights, as part of your weekly exercise routine. Try to do these types of exercises for 30 minutes at least 3 days a week.  Do not use any products that contain nicotine or tobacco, such as cigarettes, e-cigarettes, and chewing tobacco. If you need help quitting, ask your health care provider.  Monitor your blood pressure at home as told by your health care provider.  Keep all follow-up visits as told by your health care provider. This  is important. Medicines  Take over-the-counter and prescription medicines only as told by your health care provider. Follow directions carefully. Blood pressure medicines must be taken as prescribed.  Do not skip doses of blood pressure medicine. Doing this puts you at risk for problems and can make the medicine less effective.  Ask your health care provider about side effects or reactions to medicines that you should watch for. Contact a health care provider if you:  Think you are having a reaction to a medicine you are taking.  Have headaches that keep coming back (recurring).  Feel dizzy.  Have swelling in your ankles.  Have trouble with your vision. Get help right away if you:  Develop a severe headache or confusion.  Have unusual weakness or numbness.  Feel faint.  Have severe pain in your chest or abdomen.  Vomit repeatedly.  Have trouble breathing. Summary  Hypertension is when the force of blood pumping through your arteries is too strong. If this condition is not controlled, it may put you at risk for serious complications.  Your personal target blood pressure may vary depending on your medical conditions, your age, and other factors. For most people, a normal blood pressure is less than 120/80.  Hypertension is treated with lifestyle changes, medicines, or a combination of both. Lifestyle changes include losing weight, eating a healthy, low-sodium diet, exercising more, and limiting alcohol. This information is not intended to replace advice given to you by your health care provider. Make sure you discuss any questions you have with your health care provider. Document Released: 12/05/2005 Document Revised: 08/15/2018 Document Reviewed: 08/15/2018 Elsevier Patient Education  2020 [employer]      Agustina Caroli, MD [facility]

## 2019-11-19 NOTE — Patient Instructions (Addendum)
   If you have lab work done today you will be contacted with your lab results within the next 2 weeks.  If you have not heard from us then please contact us. The fastest way to get your results is to register for My Chart.   IF you received an x-ray today, you will receive an invoice from [facility]. Please contact [facility] at [phone] with questions or concerns regarding your invoice.   IF you received labwork today, you will receive an invoice from [facility]. Please contact [facility] at [phone] with questions or concerns regarding your invoice.   Our billing staff will not be able to assist you with questions regarding bills from these companies.  You will be contacted with the lab results as soon as they are available. The fastest way to get your results is to activate your My Chart account. Instructions are located on the last page of this paperwork. If you have not heard from us regarding the results in 2 weeks, please contact this office.     Hypertension, Adult High blood pressure (hypertension) is when the force of blood pumping through the arteries is too strong. The arteries are the blood vessels that carry blood from the heart throughout the body. Hypertension forces the heart to work harder to pump blood and may cause arteries to become narrow or stiff. Untreated or uncontrolled hypertension can cause a heart attack, heart failure, a stroke, kidney disease, and other problems. A blood pressure reading consists of a higher number over a lower number. Ideally, your blood pressure should be below 120/80. The first ("top") number is called the systolic pressure. It is a measure of the pressure in your arteries as your heart beats. The second ("bottom") number is called the diastolic pressure. It is a measure of the pressure in your arteries as the heart relaxes. What are the causes? The exact cause of this condition is not known. There are some conditions  that result in or are related to high blood pressure. What increases the risk? Some risk factors for high blood pressure are under your control. The following factors may make you more likely to develop this condition:  Smoking.  Having type 2 diabetes mellitus, high cholesterol, or both.  Not getting enough exercise or physical activity.  Being overweight.  Having too much fat, sugar, calories, or salt (sodium) in your diet.  Drinking too much alcohol. Some risk factors for high blood pressure may be difficult or impossible to change. Some of these factors include:  Having chronic kidney disease.  Having a family history of high blood pressure.  Age. Risk increases with age.  Race. You may be at higher risk if you are African American.  Gender. Men are at higher risk than women before age 45. After age 65, women are at higher risk than men.  Having obstructive sleep apnea.  Stress. What are the signs or symptoms? High blood pressure may not cause symptoms. Very high blood pressure (hypertensive crisis) may cause:  Headache.  Anxiety.  Shortness of breath.  Nosebleed.  Nausea and vomiting.  Vision changes.  Severe chest pain.  Seizures. How is this diagnosed? This condition is diagnosed by measuring your blood pressure while you are seated, with your arm resting on a flat surface, your legs uncrossed, and your feet flat on the floor. The cuff of the blood pressure monitor will be placed directly against the skin of your upper arm at the level of your heart.   It should be measured at least twice using the same arm. Certain conditions can cause a difference in blood pressure between your right and left arms. Certain factors can cause blood pressure readings to be lower or higher than normal for a short period of time:  When your blood pressure is higher when you are in a health care provider's office than when you are at home, this is called white coat hypertension.  Most people with this condition do not need medicines.  When your blood pressure is higher at home than when you are in a health care provider's office, this is called masked hypertension. Most people with this condition may need medicines to control blood pressure. If you have a high blood pressure reading during one visit or you have normal blood pressure with other risk factors, you may be asked to:  Return on a different day to have your blood pressure checked again.  Monitor your blood pressure at home for 1 week or longer. If you are diagnosed with hypertension, you may have other blood or imaging tests to help your health care provider understand your overall risk for other conditions. How is this treated? This condition is treated by making healthy lifestyle changes, such as eating healthy foods, exercising more, and reducing your alcohol intake. Your health care provider may prescribe medicine if lifestyle changes are not enough to get your blood pressure under control, and if:  Your systolic blood pressure is above 130.  Your diastolic blood pressure is above 80. Your personal target blood pressure may vary depending on your medical conditions, your age, and other factors. Follow these instructions at home: Eating and drinking   Eat a diet that is high in fiber and potassium, and low in sodium, added sugar, and fat. An example eating plan is called the DASH (Dietary Approaches to Stop Hypertension) diet. To eat this way: ? Eat plenty of fresh fruits and vegetables. Try to fill one half of your plate at each meal with fruits and vegetables. ? Eat whole grains, such as whole-wheat pasta, brown rice, or whole-grain bread. Fill about one fourth of your plate with whole grains. ? Eat or drink low-fat dairy products, such as skim milk or low-fat yogurt. ? Avoid fatty cuts of meat, processed or cured meats, and poultry with skin. Fill about one fourth of your plate with lean proteins, such  as fish, chicken without skin, beans, eggs, or tofu. ? Avoid pre-made and processed foods. These tend to be higher in sodium, added sugar, and fat.  Reduce your daily sodium intake. Most people with hypertension should eat less than 1,500 mg of sodium a day.  Do not drink alcohol if: ? Your health care provider tells you not to drink. ? You are pregnant, may be pregnant, or are planning to become pregnant.  If you drink alcohol: ? Limit how much you use to:  0-1 drink a day for women.  0-2 drinks a day for men. ? Be aware of how much alcohol is in your drink. In the [place] one drink equals one 12 oz bottle of beer (355 mL), one 5 oz glass of wine (148 mL), or one 1 oz glass of hard liquor (44 mL). Lifestyle   Work with your health care provider to maintain a healthy body weight or to lose weight. Ask what an ideal weight is for you.  Get at least 30 minutes of exercise most days of the week. Activities may include walking, swimming, or   biking.  Include exercise to strengthen your muscles (resistance exercise), such as Pilates or lifting weights, as part of your weekly exercise routine. Try to do these types of exercises for 30 minutes at least 3 days a week.  Do not use any products that contain nicotine or tobacco, such as cigarettes, e-cigarettes, and chewing tobacco. If you need help quitting, ask your health care provider.  Monitor your blood pressure at home as told by your health care provider.  Keep all follow-up visits as told by your health care provider. This is important. Medicines  Take over-the-counter and prescription medicines only as told by your health care provider. Follow directions carefully. Blood pressure medicines must be taken as prescribed.  Do not skip doses of blood pressure medicine. Doing this puts you at risk for problems and can make the medicine less effective.  Ask your health care provider about side effects or reactions to medicines that you  should watch for. Contact a health care provider if you:  Think you are having a reaction to a medicine you are taking.  Have headaches that keep coming back (recurring).  Feel dizzy.  Have swelling in your ankles.  Have trouble with your vision. Get help right away if you:  Develop a severe headache or confusion.  Have unusual weakness or numbness.  Feel faint.  Have severe pain in your chest or abdomen.  Vomit repeatedly.  Have trouble breathing. Summary  Hypertension is when the force of blood pumping through your arteries is too strong. If this condition is not controlled, it may put you at risk for serious complications.  Your personal target blood pressure may vary depending on your medical conditions, your age, and other factors. For most people, a normal blood pressure is less than 120/80.  Hypertension is treated with lifestyle changes, medicines, or a combination of both. Lifestyle changes include losing weight, eating a healthy, low-sodium diet, exercising more, and limiting alcohol. This information is not intended to replace advice given to you by your health care provider. Make sure you discuss any questions you have with your health care provider. Document Released: 12/05/2005 Document Revised: 08/15/2018 Document Reviewed: 08/15/2018 Elsevier Patient Education  2020 Elsevier Inc.  

## 2019-11-20 ENCOUNTER — Ambulatory Visit
Admission: RE | Admit: 2019-11-20 | Discharge: 2019-11-20 | Disposition: A | Discharge status: Home / Self Care | Payer: BC Managed Care – PPO | Source: Ambulatory Visit | Attending: Radiation Oncology | Admitting: Radiation Oncology

## 2019-11-20 ENCOUNTER — Other Ambulatory Visit: Payer: Self-pay

## 2019-11-20 DIAGNOSIS — C61 Malignant neoplasm of prostate: Secondary | ICD-10-CM | POA: Diagnosis not present

## 2019-11-21 ENCOUNTER — Other Ambulatory Visit: Payer: Self-pay

## 2019-11-21 ENCOUNTER — Ambulatory Visit
Admission: RE | Admit: 2019-11-21 | Discharge: 2019-11-21 | Disposition: A | Discharge status: Home / Self Care | Payer: BC Managed Care – PPO | Source: Ambulatory Visit | Attending: Radiation Oncology | Admitting: Radiation Oncology

## 2019-11-21 ENCOUNTER — Ambulatory Visit: Payer: Self-pay

## 2019-11-21 ENCOUNTER — Encounter: Payer: Self-pay | Admitting: Orthopaedic Surgery

## 2019-11-21 ENCOUNTER — Ambulatory Visit (INDEPENDENT_AMBULATORY_CARE_PROVIDER_SITE_OTHER): Payer: BC Managed Care – PPO | Admitting: Orthopaedic Surgery

## 2019-11-21 VITALS — Ht 75.0 in | Wt 324.0 lb

## 2019-11-21 DIAGNOSIS — M25562 Pain in left knee: Secondary | ICD-10-CM

## 2019-11-21 DIAGNOSIS — M170 Bilateral primary osteoarthritis of knee: Secondary | ICD-10-CM

## 2019-11-21 DIAGNOSIS — C61 Malignant neoplasm of prostate: Secondary | ICD-10-CM | POA: Diagnosis not present

## 2019-11-21 DIAGNOSIS — M25561 Pain in right knee: Secondary | ICD-10-CM

## 2019-11-21 DIAGNOSIS — G8929 Other chronic pain: Secondary | ICD-10-CM

## 2019-11-21 MED ORDER — LIDOCAINE HCL 1 % IJ SOLN
2.0000 mL | INTRAMUSCULAR | Status: AC | PRN
  Administered 2019-11-21: 2 mL

## 2019-11-21 MED ORDER — METHYLPREDNISOLONE ACETATE 40 MG/ML IJ SUSP
80.0000 mg | INTRAMUSCULAR | Status: AC | PRN
  Administered 2019-11-21: 80 mg via INTRA_ARTICULAR

## 2019-11-21 MED ORDER — BUPIVACAINE HCL 0.5 % IJ SOLN
2.0000 mL | INTRAMUSCULAR | Status: AC | PRN
  Administered 2019-11-21: 2 mL via INTRA_ARTICULAR

## 2019-11-21 NOTE — Progress Notes (Signed)
Office Visit Note   Patient: Bobby Mcguire           Date of Birth: 1966/10/14           MRN: [id number] Visit Date: 11/21/2019              Requested by: [name], MD [place],  [place] [address] PCP: [name], MD   Assessment & Plan: Visit Diagnoses:  1. Chronic pain of both knees   2. Primary osteoarthritis of both knees   3. Morbid (severe) obesity due to excess calories ([place])     Plan: Bilateral end-stage osteoarthritis associated with morbid obesity.  Long discussion regarding weight loss and exercises.  We will aspirate both knees and inject cortisone.  Follow-up as needed.  Discussed use of over-the-counter medicines for anti-inflammatory discomfort  Follow-Up Instructions: Return if symptoms worsen or fail to improve.   Orders:  Orders Placed This Encounter  Procedures   Large Joint Inj: bilateral knee   XR KNEE 3 VIEW LEFT   XR KNEE 3 VIEW RIGHT   No orders of the defined types were placed in this encounter.     Procedures: Large Joint Inj: bilateral knee on 11/21/2019 10:49 AM Indications: diagnostic evaluation Details: 25 G 1.5 in needle, anteromedial approach  Arthrogram: No  Medications (Right): 2 mL lidocaine 1 %; 2 mL bupivacaine 0.5 %; 80 mg methylPREDNISolone acetate 40 MG/ML Aspirate (Right): 120 mL clear Medications (Left): 2 mL lidocaine 1 %; 2 mL bupivacaine 0.5 %; 80 mg methylPREDNISolone acetate 40 MG/ML Aspirate (Left): 35 mL clear Outcome: tolerated well, no immediate complications Consent was given by the patient. Immediately prior to procedure a time out was called to verify the correct patient, procedure, equipment, support staff and site/side marked as required. Patient was prepped and draped in the usual sterile fashion.       Clinical Data: No additional findings.   Subjective: Chief Complaint  Patient presents with   Left Knee - Pain  Patient presents today for bilateral knee pain. He  was last evaluated in August of 2018. He said that his knee has gotten swollen again. The pain is located more medially. He said that both knees are painful in the same area and both swollen. The left side is worse than the right. He takes Ibuprofen as needed.  Relates that he still is overweight with a BMI of over 40.  Has not been able to exercise related to Covid.  No new injury or trauma.  HPI  Review of Systems   Objective: Vital Signs: Ht 6\' 3"  (1.905 m)    Wt (!) 324 lb (147 kg)    BMI 40.50 kg/m   Physical Exam Constitutional:      Appearance: He is well-developed.  Eyes:     Pupils: Pupils are equal, round, and reactive to light.  Pulmonary:     Effort: Pulmonary effort is normal.  Skin:    General: Skin is warm and dry.  Neurological:     Mental Status: He is alert and oriented to person, place, and time.  Psychiatric:        Behavior: Behavior normal.     Ortho Exam awake alert and oriented x3.  Comfortable sitting  Increased varus of both knees with weightbearing.  Positive effusion more on the right than the left knee.  Full extension and flex to over 105 degrees without instability.  Predominately medial joint pain bilaterally.  No popliteal pain.  No calf pain.  Some venous stasis changes distally.  Straight leg raise negative.  Painless range of motion both hips.  Positive patella crepitation but without pain  Specialty Comments:  No specialty comments available.  Imaging: Xr Knee 3 View Left  Result Date: 11/21/2019 Films of the left knee were obtained in 3 projections standing.  There are end-stage osteoarthritic changes in all 3 compartments but predominate medially where there is bone-on-bone.  There are peripheral osteophytes and subchondral sclerosis more medial than lateral.  There are considerable degenerative changes about the patellofemoral joint as well.  There is some ectopic calcification in the prepatellar region more on the right than the left.  No  acute change.  No ectopic calcification  Xr Knee 3 View Right  Result Date: 11/21/2019 Films of the right knee were obtained in 3 projections standing.  Similar to the left knee there are tricompartmental degenerative changes that are advanced.  There is nearly bone-on-bone in the medial compartment with peripheral osteophytes subchondral sclerosis.  Increased varus bilaterally.  Degenerative changes also at the lateral compartment with peripheral osteophytes.  Films are consistent with end-stage osteoarthritis    PMFS History: Patient Active Problem List   Diagnosis Date Noted   Malignant neoplasm of prostate ([place]) 08/06/2019   Morbid (severe) obesity due to excess calories ([place]) 02/13/2019   Osteoarthritis of knee 01/09/2018   Erectile dysfunction 09/19/2017   Essential hypertension 06/22/2017   Severe obstructive sleep apnea-hypopnea syndrome 05/07/2010   Past Medical History:  Diagnosis Date   Hypertension    Prostate cancer ([place])    Sleep apnea     Family History  Problem Relation Age of Onset   Colon cancer Mother    Breast cancer Neg Hx    Prostate cancer Neg Hx     Past Surgical History:  Procedure Laterality Date   PROSTATE BIOPSY     Social History   Occupational History   Not on file  Tobacco Use   Smoking status: Never Smoker   Smokeless tobacco: Never Used  Substance and Sexual Activity   Alcohol use: Yes    Alcohol/week: 3.0 standard drinks    Types: 3 Glasses of wine per week    Comment: reports he drinks a beer on occasion   Drug use: No   Sexual activity: Yes

## 2019-11-22 ENCOUNTER — Ambulatory Visit
Admission: RE | Admit: 2019-11-22 | Discharge: 2019-11-22 | Disposition: A | Discharge status: Home / Self Care | Payer: BC Managed Care – PPO | Source: Ambulatory Visit | Attending: Radiation Oncology | Admitting: Radiation Oncology

## 2019-11-22 DIAGNOSIS — C61 Malignant neoplasm of prostate: Secondary | ICD-10-CM | POA: Diagnosis not present

## 2019-11-25 ENCOUNTER — Other Ambulatory Visit: Payer: Self-pay

## 2019-11-25 ENCOUNTER — Ambulatory Visit
Admission: RE | Admit: 2019-11-25 | Discharge: 2019-11-25 | Disposition: A | Discharge status: Home / Self Care | Payer: BC Managed Care – PPO | Source: Ambulatory Visit | Attending: Radiation Oncology | Admitting: Radiation Oncology

## 2019-11-25 DIAGNOSIS — C61 Malignant neoplasm of prostate: Secondary | ICD-10-CM | POA: Diagnosis not present

## 2019-11-26 ENCOUNTER — Ambulatory Visit
Admission: RE | Admit: 2019-11-26 | Discharge: 2019-11-26 | Disposition: A | Discharge status: Home / Self Care | Payer: BC Managed Care – PPO | Source: Ambulatory Visit | Attending: Radiation Oncology | Admitting: Radiation Oncology

## 2019-11-26 ENCOUNTER — Other Ambulatory Visit: Payer: Self-pay

## 2019-11-26 DIAGNOSIS — C61 Malignant neoplasm of prostate: Secondary | ICD-10-CM | POA: Diagnosis not present

## 2019-11-27 ENCOUNTER — Ambulatory Visit
Admission: RE | Admit: 2019-11-27 | Discharge: 2019-11-27 | Disposition: A | Discharge status: Home / Self Care | Payer: BC Managed Care – PPO | Source: Ambulatory Visit | Attending: Radiation Oncology | Admitting: Radiation Oncology

## 2019-11-27 ENCOUNTER — Other Ambulatory Visit: Payer: Self-pay

## 2019-11-27 DIAGNOSIS — C61 Malignant neoplasm of prostate: Secondary | ICD-10-CM | POA: Diagnosis not present

## 2019-11-28 ENCOUNTER — Ambulatory Visit
Admission: RE | Admit: 2019-11-28 | Discharge: 2019-11-28 | Disposition: A | Discharge status: Home / Self Care | Payer: BC Managed Care – PPO | Source: Ambulatory Visit | Attending: Radiation Oncology | Admitting: Radiation Oncology

## 2019-11-28 ENCOUNTER — Other Ambulatory Visit: Payer: Self-pay

## 2019-11-28 DIAGNOSIS — C61 Malignant neoplasm of prostate: Secondary | ICD-10-CM | POA: Diagnosis not present

## 2019-11-29 ENCOUNTER — Encounter: Payer: Self-pay | Admitting: Medical Oncology

## 2019-11-29 ENCOUNTER — Ambulatory Visit
Admission: RE | Admit: 2019-11-29 | Discharge: 2019-11-29 | Disposition: A | Discharge status: Home / Self Care | Payer: BC Managed Care – PPO | Source: Ambulatory Visit | Attending: Radiation Oncology | Admitting: Radiation Oncology

## 2019-11-29 ENCOUNTER — Encounter: Payer: Self-pay | Admitting: Radiation Oncology

## 2019-11-29 ENCOUNTER — Other Ambulatory Visit: Payer: Self-pay

## 2019-11-29 DIAGNOSIS — C61 Malignant neoplasm of prostate: Secondary | ICD-10-CM | POA: Diagnosis not present

## 2019-12-02 ENCOUNTER — Ambulatory Visit: Payer: BC Managed Care – PPO

## 2019-12-08 NOTE — Progress Notes (Signed)
  Radiation Oncology         [phone] ________________________________  Name: Bobby Mcguire MRN: [id number]  Date: 11/29/2019  DOB: [birth year]  End of Treatment Note  Diagnosis:   53 y.o. gentleman with Stage T1c adenocarcinoma of the prostate with Gleason score of 4+3, and PSA of 4.7     Indication for treatment:  Curative, Definitive Radiotherapy       Radiation treatment dates:   10/22/19-11/29/19  Site/dose:   The prostate was treated to 70 Gy in 28 fractions of 2.5 Gy  Beams/energy:   The patient was treated with IMRT using volumetric arc therapy delivering 6 MV X-rays to clockwise and counterclockwise circumferential arcs with a 90 degree collimator offset to avoid dose scalloping.  Image guidance was performed with daily cone beam CT prior to each fraction to align to gold markers in the prostate and assure proper bladder and rectal fill volumes.  Immobilization was achieved with BodyFix custom mold.  Narrative: The patient tolerated radiation treatment relatively well.   The patient experienced some minor urinary irritation and modest fatigue.    Plan: The patient has completed radiation treatment. He will return to radiation oncology clinic for routine followup in one month. I advised him to call or return sooner if he has any questions or concerns related to his recovery or treatment. ________________________________  Sheral Apley. Tammi Klippel, M.D.

## 2019-12-14 ENCOUNTER — Encounter: Payer: Self-pay | Admitting: Emergency Medicine

## 2019-12-17 ENCOUNTER — Telehealth: Payer: Self-pay | Admitting: *Deleted

## 2019-12-17 NOTE — Telephone Encounter (Signed)
On 12/17/19 faxed medical records [employer] , it was consult note , two of [place] notes, sim & planning note, end of tx note,

## 2019-12-18 NOTE — Telephone Encounter (Signed)
Yes, redraw level. Thanks.

## 2019-12-19 ENCOUNTER — Other Ambulatory Visit: Payer: Self-pay

## 2019-12-19 DIAGNOSIS — Z1321 Encounter for screening for nutritional disorder: Secondary | ICD-10-CM

## 2019-12-31 ENCOUNTER — Other Ambulatory Visit: Payer: Self-pay

## 2019-12-31 ENCOUNTER — Encounter: Payer: Self-pay | Admitting: Registered Nurse

## 2019-12-31 ENCOUNTER — Telehealth (INDEPENDENT_AMBULATORY_CARE_PROVIDER_SITE_OTHER): Payer: BC Managed Care – PPO | Admitting: Registered Nurse

## 2019-12-31 VITALS — Ht 74.0 in | Wt 326.0 lb

## 2019-12-31 DIAGNOSIS — J019 Acute sinusitis, unspecified: Secondary | ICD-10-CM | POA: Diagnosis not present

## 2019-12-31 DIAGNOSIS — J988 Other specified respiratory disorders: Secondary | ICD-10-CM

## 2019-12-31 MED ORDER — AZITHROMYCIN 250 MG PO TABS: ORAL_TABLET | ORAL | 0 refills | Status: DC

## 2019-12-31 MED ORDER — PROMETHAZINE-CODEINE 6.25-10 MG/5ML PO SOLN: 5.0000 mL | Freq: Every day | ORAL | 1 refills | Status: DC

## 2019-12-31 MED ORDER — FLUTICASONE PROPIONATE 50 MCG/ACT NA SUSP: 2.0000 | Freq: Every day | NASAL | 6 refills | Status: DC

## 2019-12-31 MED ORDER — DEXTROMETHORPHAN HBR 15 MG/5ML PO SYRP: 10.0000 mL | ORAL_SOLUTION | Freq: Four times a day (QID) | ORAL | 0 refills | Status: DC | PRN

## 2019-12-31 MED ORDER — AFRIN NASAL SPRAY 0.05 % NA SOLN: 1.0000 | Freq: Two times a day (BID) | NASAL | 0 refills | Status: DC

## 2019-12-31 NOTE — Progress Notes (Signed)
Telemedicine Encounter- SOAP NOTE Established Patient  This telephone encounter was conducted with the patient's (or proxy's) verbal consent via audio telecommunications: yes  Patient was instructed to have this encounter in a suitably private space; and to only have persons present to whom they give permission to participate. In addition, patient identity was confirmed by use of name plus two identifiers (DOB and address).  I discussed the limitations, risks, security and privacy concerns of performing an evaluation and management service by telephone and the availability of in person appointments. I also discussed with the patient that there may be a patient responsible charge related to this service. The patient expressed understanding and agreed to proceed.  I spent a total of 15 minutes talking with the patient or their proxy.  Chief Complaint  Patient presents with  . Cough    per pt on 12/03/2019 blew his nose and bloody mucus, 3 weeks ago coughing nonproductive with chest congestion, took Mucinex and cough drops, COVID test tomorrow    Subjective   Bobby Mcguire is a 54 y.o. established patient. Telephone visit today for ongoing respiratory symptoms  HPI Onset around 1 mo ago. Bloody mucus from nose. Had about 3 weeks of coughing, chest congestion. Had taken mucinex with some effect. Cough drops for relief with minimal effect. Has COVID test scheduled for tomorrow. At this time seems to be sinus pressure and pain, rhinorrhea, some coughing. Seems to be mostly upper respiratory at this time, but cannot eliminate lower respiratory symptoms.  No fever, fatigue, chills. Some headache. No sensory changes. Has been in isolation. Recently finished radiation therapy for malignant neoplasm of prostate.  Patient Active Problem List   Diagnosis Date Noted  . Malignant neoplasm of prostate ([place]) 08/06/2019  . Morbid (severe) obesity due to excess calories ([place]) 02/13/2019  .  Osteoarthritis of knee 01/09/2018  . Erectile dysfunction 09/19/2017  . Essential hypertension 06/22/2017  . Severe obstructive sleep apnea-hypopnea syndrome 05/07/2010    Past Medical History:  Diagnosis Date  . Hypertension   . Prostate cancer ([place])   . Sleep apnea     Current Outpatient Medications  Medication Sig Dispense Refill  . ibuprofen (ADVIL,MOTRIN) 800 MG tablet ibuprofen 800 mg tablet  TAKE 2 TABLETS TWICE A DAY    . losartan (COZAAR) 100 MG tablet Take 1 tablet (100 mg total) by mouth daily. 90 tablet 0  . rosuvastatin (CRESTOR) 20 MG tablet Take 1 tablet (20 mg total) by mouth daily. 90 tablet 1  . sildenafil (VIAGRA) 100 MG tablet TAKE ONE-HALF (1/2) TO ONE TABLET DAILY AS NEEDED FOR ERECTILE DYSFUNCTION 30 tablet 5  . amLODipine (NORVASC) 10 MG tablet Take 1 tablet (10 mg total) by mouth daily. 90 tablet 3  . azithromycin (ZITHROMAX) 250 MG tablet Take 2 tablets on first day, then take 1 tablet daily. Finish entire supply 6 tablet 0  . dextromethorphan 15 MG/5ML syrup Take 10 mLs (30 mg total) by mouth 4 (four) times daily as needed for cough. 120 mL 0  . fluticasone (FLONASE) 50 MCG/ACT nasal spray Place 2 sprays into both nostrils daily. 16 g 6  . oxymetazoline (AFRIN NASAL SPRAY) 0.05 % nasal spray Place 1 spray into both nostrils 2 (two) times daily. 30 mL 0  . Promethazine-Codeine 6.25-10 MG/5ML SOLN Take 5 mLs by mouth at bedtime. 280 mL 1   No current facility-administered medications for this visit.    Allergies  Allergen Reactions  . No Known Allergies  Social History   Socioeconomic History  . Marital status: Married    Spouse name: Not on file  . Number of children: 4  . Years of education: Not on file  . Highest education level: Not on file  Occupational History  . Not on file  Tobacco Use  . Smoking status: Never Smoker  . Smokeless tobacco: Never Used  Substance and Sexual Activity  . Alcohol use: Yes    Alcohol/week: 3.0 standard  drinks    Types: 3 Glasses of wine per week    Comment: reports he drinks a beer on occasion  . Drug use: No  . Sexual activity: Yes  Other Topics Concern  . Not on file  Social History Narrative   3 biological and 1 stepchild   Social Determinants of Health   Financial Resource Strain:   . Difficulty of Paying Living Expenses: Not on file  Food Insecurity:   . Worried About Charity fundraiser in the Last Year: Not on file  . Ran Out of Food in the Last Year: Not on file  Transportation Needs:   . Lack of Transportation (Medical): Not on file  . Lack of Transportation (Non-Medical): Not on file  Physical Activity:   . Days of Exercise per Week: Not on file  . Minutes of Exercise per Session: Not on file  Stress:   . Feeling of Stress : Not on file  Social Connections:   . Frequency of Communication with Friends and Family: Not on file  . Frequency of Social Gatherings with Friends and Family: Not on file  . Attends Religious Services: Not on file  . Active Member of Clubs or Organizations: Not on file  . Attends Archivist Meetings: Not on file  . Marital Status: Not on file  Intimate Partner Violence:   . Fear of Current or Ex-Partner: Not on file  . Emotionally Abused: Not on file  . Physically Abused: Not on file  . Sexually Abused: Not on file    ROS Per hpi   Objective   Vitals as reported by the patient: Today's Vitals   12/31/19 0937  Weight: (!) 326 lb (147.9 kg)  Height: 6\' 2"  (1.88 m)    [name] was seen today for cough.  Diagnoses and all orders for this visit:  Acute sinusitis, recurrence not specified, unspecified location -     azithromycin (ZITHROMAX) 250 MG tablet; Take 2 tablets on first day, then take 1 tablet daily. Finish entire supply -     dextromethorphan 15 MG/5ML syrup; Take 10 mLs (30 mg total) by mouth 4 (four) times daily as needed for cough. -     Promethazine-Codeine 6.25-10 MG/5ML SOLN; Take 5 mLs by mouth at  bedtime. -     oxymetazoline (AFRIN NASAL SPRAY) 0.05 % nasal spray; Place 1 spray into both nostrils 2 (two) times daily. -     fluticasone (FLONASE) 50 MCG/ACT nasal spray; Place 2 sprays into both nostrils daily.  Respiratory infection -     azithromycin (ZITHROMAX) 250 MG tablet; Take 2 tablets on first day, then take 1 tablet daily. Finish entire supply -     dextromethorphan 15 MG/5ML syrup; Take 10 mLs (30 mg total) by mouth 4 (four) times daily as needed for cough. -     Promethazine-Codeine 6.25-10 MG/5ML SOLN; Take 5 mLs by mouth at bedtime. -     oxymetazoline (AFRIN NASAL SPRAY) 0.05 % nasal spray; Place 1 spray into both  nostrils 2 (two) times daily. -     fluticasone (FLONASE) 50 MCG/ACT nasal spray; Place 2 sprays into both nostrils daily.   PLAN  Given the duration of this patient's symptoms and his increased risk for infection, will start treatment with z pack.   Will also provide dextromethorphan for daytime cough relief, prometh with codeine for nighttime relief  Fluticasone and afrin for intermittent use for congestion relief.  We will stay up to date on his COVID testing  He should follow up if symptoms worsen or fail to improve  Red flag symptoms reviewed  Patient encouraged to call clinic with any questions, comments, or concerns.   I discussed the assessment and treatment plan with the patient. The patient was provided an opportunity to ask questions and all were answered. The patient agreed with the plan and demonstrated an understanding of the instructions.   The patient was advised to call back or seek an in-person evaluation if the symptoms worsen or if the condition fails to improve as anticipated.  I provided 15 minutes of non-face-to-face time during this encounter.  Maximiano Coss, NP  Primary Care at [facility]

## 2020-01-01 ENCOUNTER — Encounter (HOSPITAL_COMMUNITY): Payer: Self-pay | Admitting: Emergency Medicine

## 2020-01-01 ENCOUNTER — Emergency Department (HOSPITAL_COMMUNITY)
Admission: EM | Admit: 2020-01-01 | Discharge: 2020-01-01 | Disposition: A | Discharge status: Home / Self Care | Payer: BC Managed Care – PPO | Attending: Emergency Medicine | Admitting: Emergency Medicine

## 2020-01-01 ENCOUNTER — Other Ambulatory Visit: Payer: Self-pay

## 2020-01-01 ENCOUNTER — Emergency Department (HOSPITAL_COMMUNITY): Payer: BC Managed Care – PPO

## 2020-01-01 ENCOUNTER — Other Ambulatory Visit: Payer: BC Managed Care – PPO

## 2020-01-01 DIAGNOSIS — Z20822 Contact with and (suspected) exposure to covid-19: Secondary | ICD-10-CM | POA: Diagnosis not present

## 2020-01-01 DIAGNOSIS — J392 Other diseases of pharynx: Secondary | ICD-10-CM | POA: Insufficient documentation

## 2020-01-01 DIAGNOSIS — R05 Cough: Secondary | ICD-10-CM | POA: Diagnosis not present

## 2020-01-01 DIAGNOSIS — I10 Essential (primary) hypertension: Secondary | ICD-10-CM | POA: Insufficient documentation

## 2020-01-01 DIAGNOSIS — R0602 Shortness of breath: Secondary | ICD-10-CM | POA: Diagnosis not present

## 2020-01-01 DIAGNOSIS — Z79899 Other long term (current) drug therapy: Secondary | ICD-10-CM | POA: Insufficient documentation

## 2020-01-01 LAB — SARS CORONAVIRUS 2 (TAT 6-24 HRS): SARS Coronavirus 2: NEGATIVE

## 2020-01-01 NOTE — Discharge Instructions (Addendum)
Return here as needed.  Follow-up with your primary doctor. °

## 2020-01-01 NOTE — ED Triage Notes (Signed)
Pt's wife had covid 4-5 weeks ago.  Pt states that he began having a dry, unproductive cough about 2 weeks ago.  Pt states that he has began to have trouble swallowing.  States that he did a telephone visit with his doctor and his doctor prescribed him an antibiotic.  Pt states he thinks he needs an x-ray of his chest and his throat.  Pt states that he doesn't have any pain.  Pt states that it feels like he can swallow water but not his saliva.  Pt appears to maintaining his own secretions.

## 2020-01-01 NOTE — ED Notes (Signed)
Harrell Gave, Butner at bedside.

## 2020-01-01 NOTE — ED Provider Notes (Signed)
[facility] DEPT Provider Note   CSN: [id number] Arrival date & time: 01/01/20  0540     History Chief Complaint  Patient presents with  . Sore Throat    Bobby Mcguire is a 54 y.o. male.  HPI Patient presents to the emergency department with dry throat and dry nose.  The patient states that about 5 to 6 weeks ago his wife had Covid and he had symptoms that were more significant at that time.  The patient states that he was here to get checked because he is worried about having Covid.  The patient states that at this point he is not having many symptoms other than the dry throat.  Patient states his symptoms in December lasted about 5 days.  The patient denies chest pain, shortness of breath, headache,blurred vision, neck pain, fever, cough, weakness, numbness, dizziness, anorexia, edema, abdominal pain, nausea, vomiting, diarrhea, rash, back pain, dysuria, hematemesis, bloody stool, near syncope, or syncope.    Past Medical History:  Diagnosis Date  . Hypertension   . Prostate cancer ([place])   . Prostate cancer ([place])   . Sleep apnea     Patient Active Problem List   Diagnosis Date Noted  . Malignant neoplasm of prostate ([place]) 08/06/2019  . Morbid (severe) obesity due to excess calories ([place]) 02/13/2019  . Osteoarthritis of knee 01/09/2018  . Erectile dysfunction 09/19/2017  . Essential hypertension 06/22/2017  . Severe obstructive sleep apnea-hypopnea syndrome 05/07/2010    Past Surgical History:  Procedure Laterality Date  . PROSTATE BIOPSY         Family History  Problem Relation Age of Onset  . Colon cancer Mother   . Breast cancer Neg Hx   . Prostate cancer Neg Hx     Social History   Tobacco Use  . Smoking status: Never Smoker  . Smokeless tobacco: Never Used  Substance Use Topics  . Alcohol use: Yes    Alcohol/week: 3.0 standard drinks    Types: 3 Glasses of wine per week    Comment: reports he drinks a beer on occasion    . Drug use: No    Home Medications Prior to Admission medications   Medication Sig Start Date End Date Taking? Authorizing Provider  amLODipine (NORVASC) 10 MG tablet Take 1 tablet (10 mg total) by mouth daily. 07/22/19 10/20/19  [name], MD  azithromycin (ZITHROMAX) 250 MG tablet Take 2 tablets on first day, then take 1 tablet daily. Finish entire supply 12/31/19   Maximiano Coss, NP  dextromethorphan 15 MG/5ML syrup Take 10 mLs (30 mg total) by mouth 4 (four) times daily as needed for cough. 12/31/19   Maximiano Coss, NP  fluticasone (FLONASE) 50 MCG/ACT nasal spray Place 2 sprays into both nostrils daily. 12/31/19   Maximiano Coss, NP  ibuprofen (ADVIL,MOTRIN) 800 MG tablet ibuprofen 800 mg tablet  TAKE 2 TABLETS TWICE A DAY    [provider]  losartan (COZAAR) 100 MG tablet Take 1 tablet (100 mg total) by mouth daily. 10/11/19   [name], MD  oxymetazoline [facility] NASAL SPRAY) 0.05 % nasal spray Place 1 spray into both nostrils 2 (two) times daily. 12/31/19   Maximiano Coss, NP  Promethazine-Codeine 6.25-10 MG/5ML SOLN Take 5 mLs by mouth at bedtime. 12/31/19   Maximiano Coss, NP  rosuvastatin (CRESTOR) 20 MG tablet Take 1 tablet (20 mg total) by mouth daily. 10/09/19   [name], MD  sildenafil (VIAGRA) 100 MG tablet TAKE  ONE-HALF (1/2) TO ONE TABLET DAILY AS NEEDED FOR ERECTILE DYSFUNCTION 06/06/19   [name], MD    Allergies    No known allergies  Review of Systems   Review of Systems All other systems negative except as documented in the HPI. All pertinent positives and negatives as reviewed in the HPI. Physical Exam Updated Vital Signs BP (!) 153/86 (BP Location: Left Arm)   Pulse 100   Temp 98.2 F (36.8 C) (Oral)   Resp 18   SpO2 100%   Physical Exam Vitals and nursing note reviewed.  Constitutional:      General: He is not in acute distress.    Appearance: He is well-developed.  HENT:     Head:  Normocephalic and atraumatic.  Eyes:     Pupils: Pupils are equal, round, and reactive to light.  Cardiovascular:     Rate and Rhythm: Normal rate and regular rhythm.     Heart sounds: Normal heart sounds. No murmur. No friction rub. No gallop.   Pulmonary:     Effort: Pulmonary effort is normal. No respiratory distress.     Breath sounds: Normal breath sounds. No wheezing.  Abdominal:     General: Bowel sounds are normal. There is no distension.     Palpations: Abdomen is soft.     Tenderness: There is no abdominal tenderness.  Musculoskeletal:     Cervical back: Normal range of motion and neck supple.  Skin:    General: Skin is warm and dry.     Capillary Refill: Capillary refill takes less than 2 seconds.     Findings: No erythema or rash.  Neurological:     Mental Status: He is alert and oriented to person, place, and time.     Motor: No abnormal muscle tone.     Coordination: Coordination normal.  Psychiatric:        Behavior: Behavior normal.     ED Results / Procedures / Treatments   Labs (all labs ordered are listed, but only abnormal results are displayed) Labs Reviewed  SARS CORONAVIRUS 2 (TAT 6-24 HRS)    EKG None  Radiology DG Chest 2 View  Result Date: 01/01/2020 CLINICAL DATA:  Cough and shortness of breath, diagnosed with COVID 2 weeks ago. EXAM: CHEST - 2 VIEW COMPARISON:  07/22/2019. FINDINGS: Trachea is midline. Heart size normal. Lungs are clear. No pleural fluid. IMPRESSION: Normal exam. Electronically Signed   By: Lorin Picket M.D.   On: 01/01/2020 08:09    Procedures Procedures (including critical care time)  Medications Ordered in ED Medications - No data to display  ED Course  I have reviewed the triage vital signs and the nursing notes.  Pertinent labs & imaging results that were available during my care of the patient were reviewed by me and considered in my medical decision making (see chart for details).    MDM  Rules/Calculators/A&P                      I do believe the patient may for Covid back in December but with his minimal symptoms at this time I feel that this is less likely.  Patient is advised to follow-up with his doctor.  Told to return here as needed.  Patient agrees the plan thus far and all questions have been answered thus far. Final Clinical Impression(s) / ED Diagnoses Final diagnoses:  None    Rx / DC Orders ED Discharge Orders  None       Sebastian, [place], PA-C 01/01/20 [id number]    Maudie Flakes, MD 01/02/20 [phone]

## 2020-01-02 ENCOUNTER — Ambulatory Visit
Admission: RE | Admit: 2020-01-02 | Discharge: 2020-01-02 | Disposition: A | Discharge status: Home / Self Care | Payer: BC Managed Care – PPO | Source: Ambulatory Visit | Attending: Urology | Admitting: Urology

## 2020-01-02 ENCOUNTER — Other Ambulatory Visit: Payer: Self-pay

## 2020-01-02 DIAGNOSIS — C61 Malignant neoplasm of prostate: Secondary | ICD-10-CM

## 2020-01-02 NOTE — Progress Notes (Signed)
[facility]         [phone] ________________________________  Name: Bobby Mcguire MRN: [id number]  Date: 01/02/2020  DOB: 1966-11-21  Post Treatment Note  CC: Bobby Pollen, MD  Bobby Mcguire, Bobby Mcguire  Diagnosis:   54 y.o. gentleman with Stage T1c adenocarcinoma of the prostate with Gleason score of 4+3, and PSA of 4.7     Interval Since Last Radiation:  5 weeks  10/22/19-11/29/19:  The prostate was treated to 70 Gy in 28 fractions of 2.5 Gy; concurrent with ST-ADT-started 08/15/19  Narrative:  I spoke with the patient to conduct his routine scheduled 1 month follow up visit via telephone to spare the patient unnecessary potential exposure in the healthcare setting during the current COVID-19 pandemic.  The patient was notified in advance and gave permission to proceed with this visit format. He tolerated radiation treatment relatively well.   The patient experienced some minor urinary irritation with increased frequency, urgency, weak stream, intermittency, and nocturia 4-6x/night as well as modest fatigue.                               On review of systems, the patient states that he is doing very well in general.  He has noticed gradual improvement in the LUTS but continues with increased frequency, urgency, intermittency, weaker flow of stream and nocturia but feels that he is emptying his bladder well the majority of the time.  He specifically denies dysuria, gross hematuria or incontinence.  He reports a healthy appetite and is maintaining his weight.  He has tolerated the ADT well but has suffered with some pretty intense hot flashes periodically as well as modest fatigue- feels that this is gradually improving at this point.  He denies abdominal pain, nausea, vomiting, diarrhea or constipation.  Overall, he is quite pleased with his progress to date.  ALLERGIES:  is allergic to no known allergies.  Meds: Current Outpatient Medications  Medication Sig Dispense  Refill  . azithromycin (ZITHROMAX) 250 MG tablet Take 2 tablets on first day, then take 1 tablet daily. Finish entire supply 6 tablet 0  . dextromethorphan 15 MG/5ML syrup Take 10 mLs (30 mg total) by mouth 4 (four) times daily as needed for cough. 120 mL 0  . fluticasone (FLONASE) 50 MCG/ACT nasal spray Place 2 sprays into both nostrils daily. 16 g 6  . ibuprofen (ADVIL,MOTRIN) 800 MG tablet ibuprofen 800 mg tablet  TAKE 2 TABLETS TWICE A DAY    . losartan (COZAAR) 100 MG tablet Take 1 tablet (100 mg total) by mouth daily. 90 tablet 0  . oxymetazoline (AFRIN NASAL SPRAY) 0.05 % nasal spray Place 1 spray into both nostrils 2 (two) times daily. 30 mL 0  . Promethazine-Codeine 6.25-10 MG/5ML SOLN Take 5 mLs by mouth at bedtime. 280 mL 1  . rosuvastatin (CRESTOR) 20 MG tablet Take 1 tablet (20 mg total) by mouth daily. 90 tablet 1  . amLODipine (NORVASC) 10 MG tablet Take 1 tablet (10 mg total) by mouth daily. 90 tablet 3  . sildenafil (VIAGRA) 100 MG tablet TAKE ONE-HALF (1/2) TO ONE TABLET DAILY AS NEEDED FOR ERECTILE DYSFUNCTION (Patient not taking: Reported on 01/02/2020) 30 tablet 5   No current facility-administered medications for this encounter.    Physical Findings:  vitals were not taken for this visit.   /Unable to assess due to telephone follow up visit format.   Lab Findings: Lab  Results  Component Value Date   WBC 9.3 05/23/2019   HGB 14.2 05/23/2019   HCT 42.9 05/23/2019   MCV 99 (H) 05/23/2019   PLT 331 05/23/2019     Radiographic Findings: DG Chest 2 View  Result Date: 01/01/2020 CLINICAL DATA:  Cough and shortness of breath, diagnosed with COVID 2 weeks ago. EXAM: CHEST - 2 VIEW COMPARISON:  07/22/2019. FINDINGS: Trachea is midline. Heart size normal. Lungs are clear. No pleural fluid. IMPRESSION: Normal exam. Electronically Signed   By: Lorin Picket M.D.   On: 01/01/2020 08:09    Impression/Plan: 1. 54 y.o. gentleman with Stage T1c adenocarcinoma of the  prostate with Gleason score of 4+3, and PSA of 4.7. He will continue to follow up with urology for ongoing PSA determinations but does not currently have a follow-up appointment scheduled with Dr. Karsten Ro to his knowledge.  I advised him to call [facility] to schedule a follow-up visit in March 2021 if he has not heard from Dr. Simone Curia nurse in the next 1 to 2 weeks. He will not need further ADT as he got a 25-month Lupron injection in August 2020 and is not considered to have high risk disease.  He understands what to expect with regards to PSA monitoring going forward. I will look forward to following his response to treatment via correspondence with urology, and would be happy to continue to participate in his care if clinically indicated. I talked to the patient about what to expect in the future, including his risk for erectile dysfunction and rectal bleeding. I encouraged him to call or return to the office if he has any questions regarding his previous radiation or possible radiation side effects. He was comfortable with this plan and will follow up as needed.    Bobby Johns, PA-C

## 2020-01-04 ENCOUNTER — Other Ambulatory Visit: Payer: Self-pay | Admitting: Emergency Medicine

## 2020-01-06 ENCOUNTER — Other Ambulatory Visit: Payer: Self-pay

## 2020-01-06 ENCOUNTER — Ambulatory Visit (INDEPENDENT_AMBULATORY_CARE_PROVIDER_SITE_OTHER): Payer: BC Managed Care – PPO | Admitting: Emergency Medicine

## 2020-01-06 DIAGNOSIS — Z1321 Encounter for screening for nutritional disorder: Secondary | ICD-10-CM | POA: Diagnosis not present

## 2020-01-07 ENCOUNTER — Ambulatory Visit (INDEPENDENT_AMBULATORY_CARE_PROVIDER_SITE_OTHER): Payer: BC Managed Care – PPO | Admitting: Registered Nurse

## 2020-01-07 ENCOUNTER — Emergency Department (HOSPITAL_COMMUNITY)
Admission: EM | Admit: 2020-01-07 | Discharge: 2020-01-07 | Disposition: A | Discharge status: Home / Self Care | Payer: BC Managed Care – PPO | Attending: Emergency Medicine | Admitting: Emergency Medicine

## 2020-01-07 ENCOUNTER — Other Ambulatory Visit: Payer: Self-pay

## 2020-01-07 ENCOUNTER — Encounter (HOSPITAL_COMMUNITY): Payer: Self-pay | Admitting: Emergency Medicine

## 2020-01-07 ENCOUNTER — Encounter: Payer: Self-pay | Admitting: Registered Nurse

## 2020-01-07 VITALS — BP 157/77 | HR 93 | Temp 97.8°F | Ht 74.0 in | Wt 333.0 lb

## 2020-01-07 DIAGNOSIS — Z23 Encounter for immunization: Secondary | ICD-10-CM

## 2020-01-07 DIAGNOSIS — R198 Other specified symptoms and signs involving the digestive system and abdomen: Secondary | ICD-10-CM

## 2020-01-07 DIAGNOSIS — Z79899 Other long term (current) drug therapy: Secondary | ICD-10-CM | POA: Insufficient documentation

## 2020-01-07 DIAGNOSIS — R0989 Other specified symptoms and signs involving the circulatory and respiratory systems: Secondary | ICD-10-CM

## 2020-01-07 DIAGNOSIS — E785 Hyperlipidemia, unspecified: Secondary | ICD-10-CM

## 2020-01-07 DIAGNOSIS — R6889 Other general symptoms and signs: Secondary | ICD-10-CM

## 2020-01-07 DIAGNOSIS — Z8546 Personal history of malignant neoplasm of prostate: Secondary | ICD-10-CM | POA: Diagnosis not present

## 2020-01-07 DIAGNOSIS — I10 Essential (primary) hypertension: Secondary | ICD-10-CM | POA: Diagnosis not present

## 2020-01-07 DIAGNOSIS — Z20822 Contact with and (suspected) exposure to covid-19: Secondary | ICD-10-CM | POA: Diagnosis not present

## 2020-01-07 DIAGNOSIS — R1310 Dysphagia, unspecified: Secondary | ICD-10-CM | POA: Diagnosis not present

## 2020-01-07 DIAGNOSIS — F5104 Psychophysiologic insomnia: Secondary | ICD-10-CM

## 2020-01-07 DIAGNOSIS — R0602 Shortness of breath: Secondary | ICD-10-CM | POA: Diagnosis not present

## 2020-01-07 DIAGNOSIS — J111 Influenza due to unidentified influenza virus with other respiratory manifestations: Secondary | ICD-10-CM | POA: Insufficient documentation

## 2020-01-07 LAB — VITAMIN D 25 HYDROXY (VIT D DEFICIENCY, FRACTURES): Vit D, 25-Hydroxy: 38.6 ng/mL (ref 30.0–100.0)

## 2020-01-07 MED ORDER — TRAZODONE HCL 50 MG PO TABS: 25.0000 mg | ORAL_TABLET | Freq: Every evening | ORAL | 3 refills | Status: DC | PRN

## 2020-01-07 MED ORDER — OMEPRAZOLE 20 MG PO CPDR: 20.0000 mg | DELAYED_RELEASE_CAPSULE | Freq: Every day | ORAL | 3 refills | Status: DC

## 2020-01-07 NOTE — ED Triage Notes (Signed)
Pt reports that his wife had Covid in December and he has been getting SOB with exertion and having dry throat for couple weeks. Reports came in here last week and Covid was negative. Wife told him that could be too soon to be tested so he wants a covid test.

## 2020-01-07 NOTE — Progress Notes (Signed)
Established Patient Office Visit  Subjective:  Patient ID: Bobby Mcguire, male    DOB: 1966/02/20  Age: 54 y.o. MRN: [id number]  CC:  Chief Complaint  Patient presents with  . Follow-up    throat still a little dry only when swallowing his saliva. went to ER this morning got another covid test today ,but the one last week was negative. also would like to talk about shingles.    HPI Bobby Mcguire presents for follow up to last week VV. Feeling much better after course of abx.  However, woke up this morning with very dry mouth and difficulty swallowing. Went to [facility] ED, was assessed, discharged stable, instructed to follow up with PCP Here today with concern for globus sensation. Worried he had COVID in past, negative test last week, deemed by [facility] ED to not have symptoms concerning for COVID. Pt denies these symptoms and those typical of influenza.  Globus sensation has been present for today. No change. No dysphagia when swallowing food or water - only when swallowing saliva. No hx of GERD to his knowledge. No change to diet or allergies that he is aware of. No hx of thyroid disorders.  Due for shingrix, will give first injection today.  Past Medical History:  Diagnosis Date  . Hypertension   . Prostate cancer ([place])   . Prostate cancer ([place])   . Sleep apnea     Past Surgical History:  Procedure Laterality Date  . PROSTATE BIOPSY      Family History  Problem Relation Age of Onset  . Colon cancer Mother   . Breast cancer Neg Hx   . Prostate cancer Neg Hx     Social History   Socioeconomic History  . Marital status: Married    Spouse name: Not on file  . Number of children: 4  . Years of education: Not on file  . Highest education level: Not on file  Occupational History  . Not on file  Tobacco Use  . Smoking status: Never Smoker  . Smokeless tobacco: Never Used  Substance and Sexual Activity  . Alcohol use: Yes    Alcohol/week: 3.0 standard drinks    Types: 3  Glasses of wine per week    Comment: reports he drinks a beer on occasion  . Drug use: No  . Sexual activity: Yes  Other Topics Concern  . Not on file  Social History Narrative   3 biological and 1 stepchild   Social Determinants of Health   Financial Resource Strain:   . Difficulty of Paying Living Expenses: Not on file  Food Insecurity:   . Worried About Charity fundraiser in the Last Year: Not on file  . Ran Out of Food in the Last Year: Not on file  Transportation Needs:   . Lack of Transportation (Medical): Not on file  . Lack of Transportation (Non-Medical): Not on file  Physical Activity:   . Days of Exercise per Week: Not on file  . Minutes of Exercise per Session: Not on file  Stress:   . Feeling of Stress : Not on file  Social Connections:   . Frequency of Communication with Friends and Family: Not on file  . Frequency of Social Gatherings with Friends and Family: Not on file  . Attends Religious Services: Not on file  . Active Member of Clubs or Organizations: Not on file  . Attends Archivist Meetings: Not on file  . Marital Status: Not  on file  Intimate Partner Violence:   . Fear of Current or Ex-Partner: Not on file  . Emotionally Abused: Not on file  . Physically Abused: Not on file  . Sexually Abused: Not on file    Outpatient Medications Prior to Visit  Medication Sig Dispense Refill  . fluticasone (FLONASE) 50 MCG/ACT nasal spray Place 2 sprays into both nostrils daily. 16 g 6  . ibuprofen (ADVIL,MOTRIN) 800 MG tablet ibuprofen 800 mg tablet  TAKE 2 TABLETS TWICE A DAY    . losartan (COZAAR) 100 MG tablet TAKE 1 TABLET DAILY 90 tablet 1  . oxymetazoline (AFRIN NASAL SPRAY) 0.05 % nasal spray Place 1 spray into both nostrils 2 (two) times daily. 30 mL 0  . rosuvastatin (CRESTOR) 20 MG tablet Take 1 tablet (20 mg total) by mouth daily. 90 tablet 1  . sildenafil (VIAGRA) 100 MG tablet TAKE ONE-HALF (1/2) TO ONE TABLET DAILY AS NEEDED FOR  ERECTILE DYSFUNCTION 30 tablet 5  . amLODipine (NORVASC) 10 MG tablet Take 1 tablet (10 mg total) by mouth daily. 90 tablet 3  . azithromycin (ZITHROMAX) 250 MG tablet Take 2 tablets on first day, then take 1 tablet daily. Finish entire supply (Patient not taking: Reported on 01/07/2020) 6 tablet 0  . dextromethorphan 15 MG/5ML syrup Take 10 mLs (30 mg total) by mouth 4 (four) times daily as needed for cough. (Patient not taking: Reported on 01/07/2020) 120 mL 0  . Promethazine-Codeine 6.25-10 MG/5ML SOLN Take 5 mLs by mouth at bedtime. (Patient not taking: Reported on 01/07/2020) 280 mL 1   No facility-administered medications prior to visit.    Allergies  Allergen Reactions  . No Known Allergies     ROS Review of Systems  Constitutional: Negative.   HENT: Positive for postnasal drip and trouble swallowing. Negative for congestion, drooling, ear discharge, ear pain, sinus pressure, sinus pain, sneezing, sore throat and voice change.   Eyes: Negative.   Respiratory: Negative.   Cardiovascular: Negative.   Gastrointestinal: Negative.   Endocrine: Negative.   Genitourinary: Negative.   Musculoskeletal: Negative.   Skin: Negative.   Allergic/Immunologic: Negative.   Neurological: Negative.   Hematological: Negative.   Psychiatric/Behavioral: Negative.   All other systems reviewed and are negative.     Objective:    Physical Exam  Constitutional: He appears well-developed and well-nourished. No distress.  HENT:  Mouth/Throat: Uvula is midline, oropharynx is clear and moist and mucous membranes are normal. Mucous membranes are not pale and not cyanotic. Normal dentition. No dental abscesses or uvula swelling. No oropharyngeal exudate, posterior oropharyngeal edema, posterior oropharyngeal erythema or tonsillar abscesses.  Eyes: Pupils are equal, round, and reactive to light. Conjunctivae and EOM are normal. Right eye exhibits no discharge. Left eye exhibits no discharge. No scleral  icterus.  Neck: Normal carotid pulses present. No tracheal tenderness present. No tracheal deviation present. No thyroid mass and no thyromegaly present.  Cardiovascular: Normal rate and regular rhythm.  Pulmonary/Chest: Effort normal. No respiratory distress.  Abdominal: Soft. Bowel sounds are normal. He exhibits no distension and no mass. There is no abdominal tenderness. There is no rebound and no guarding.  Musculoskeletal:     Cervical back: Normal range of motion and neck supple. No edema or erythema. No muscular tenderness. Normal range of motion.  Lymphadenopathy:    He has no cervical adenopathy.  Skin: Skin is warm and dry. No rash noted. He is not diaphoretic. No erythema. No pallor.  Psychiatric: He has a normal  mood and affect. His behavior is normal. Judgment and thought content normal.  Nursing note and vitals reviewed.   BP (!) 157/77   Pulse 93   Temp 97.8 F (36.6 C) (Temporal)   Ht 6\' 2"  (1.88 m)   Wt (!) 333 lb (151 kg)   SpO2 100%   BMI 42.75 kg/m  Wt Readings from Last 3 Encounters:  01/07/20 (!) 333 lb (151 kg)  12/31/19 (!) 326 lb (147.9 kg)  11/21/19 (!) 324 lb (147 kg)     There are no preventive care reminders to display for this patient.  There are no preventive care reminders to display for this patient.  Lab Results  Component Value Date   TSH 1.23 04/17/2016   Lab Results  Component Value Date   WBC 9.3 05/23/2019   HGB 14.2 05/23/2019   HCT 42.9 05/23/2019   MCV 99 (H) 05/23/2019   PLT 331 05/23/2019   Lab Results  Component Value Date   NA 139 05/23/2019   K 4.6 05/23/2019   CO2 23 05/23/2019   GLUCOSE 93 05/23/2019   BUN 9 05/23/2019   CREATININE 1.01 05/23/2019   BILITOT 0.6 05/23/2019   ALKPHOS 65 05/23/2019   AST 20 05/23/2019   ALT 30 05/23/2019   PROT 7.5 05/23/2019   ALBUMIN 4.7 05/23/2019   CALCIUM 10.4 (H) 05/23/2019   ANIONGAP 11 08/05/2018   GFR 89.54 01/07/2019   Lab Results  Component Value Date   CHOL 186  05/23/2019   Lab Results  Component Value Date   HDL 44 05/23/2019   Lab Results  Component Value Date   LDLCALC 115 (H) 05/23/2019   Lab Results  Component Value Date   TRIG 137 05/23/2019   Lab Results  Component Value Date   CHOLHDL 4.2 05/23/2019   Lab Results  Component Value Date   HGBA1C 5.0 11/05/2018      Assessment & Plan:   Problem List Items Addressed This Visit      Cardiovascular and Mediastinum   Essential hypertension   Relevant Orders   Comprehensive metabolic panel   Lipid panel    Other Visit Diagnoses    Dysphagia, unspecified type    -  Primary   Relevant Orders   TSH   Globus sensation       Relevant Medications   omeprazole (PRILOSEC) 20 MG capsule   Dyslipidemia       Relevant Orders   Comprehensive metabolic panel   Lipid panel   Need for shingles vaccine       Relevant Orders   Varicella-zoster vaccine IM (Shingrix) (Completed)   Psychophysiological insomnia       Relevant Medications   traZODone (DESYREL) 50 MG tablet      Meds ordered this encounter  Medications  . omeprazole (PRILOSEC) 20 MG capsule    Sig: Take 1 capsule (20 mg total) by mouth daily.    Dispense:  30 capsule    Refill:  3    Order Specific Question:   Supervising Provider    Answer:   Delia Chimes A [id number]  . traZODone (DESYREL) 50 MG tablet    Sig: Take 0.5-1 tablets (25-50 mg total) by mouth at bedtime as needed for sleep.    Dispense:  90 tablet    Refill:  3    Order Specific Question:   Supervising Provider    Answer:   Forrest Moron [id number]    Follow-up: Return in about 2 months (  around 03/06/2020) for Shingles Vaccine 2nd shot  nurse visit in 2-6 mos.   PLAN  This is likely some residual postnasal drip  May be GERD - will try omeprazole daily for at least one week   If symptoms persist, will refer to ENT  Shingrix first dose given. Pt understands to return in 2-38mo  Requesting refill on hydroxyzine for sleep, will switch  him to trazodone. Pt understands and agrees to plan  Will draw labs, including TSH for thyroid, and follow up as warranted  Patient encouraged to call clinic with any questions, comments, or concerns.  I spent 25 minutes with this patient, more than 50% of which was spent counseling/educating. Maximiano Coss, NP

## 2020-01-07 NOTE — Patient Instructions (Addendum)
Bobby Mcguire - great to see you today.  Having difficulty swallowing can be troublesome. Luckily, we can start with pulling on a few threads here  -  I want to check your thyroid levels. Your thyroid gland didn't feel inflamed, but sometimes this is not reliable on exam.  This feeling may be related to GERD (gastroesophageal reflux disease, or heartburn). Oftentimes, if this happens at night especially, you can wake with a sensation of a lump in your throat. We can address this by trying you on Omeprazole 20mg  taken first thing in the morning with a big glass of water.  Additionally, we may be looking at some residual postnasal drip from your recent respiratory infection. This would likely resolve in the coming days as your condition continues to improve. Please use the fluticasone at night and a saline spray in the mornings if your nasal passages feel dried out. Additionally, continue your excellent habits with your CPAP and adding a humidifier (especially in these dry winter months) wouldn't be a bad idea.  If your symptoms aren't improving or are worsening by the end of the week, give [place] a call. Our next step will be a referral over to ENT.  If you have lab work done today you will be contacted with your lab results within the next 2 weeks.  If you have not heard from [place] then please contact us. The fastest way to get your results is to register for My Chart.   IF you received an x-ray today, you will receive an invoice from [facility]. Please contact [facility] at [phone] with questions or concerns regarding your invoice.   IF you received labwork today, you will receive an invoice from [facility]. Please contact [facility] at [phone] with questions or concerns regarding your invoice.   Our billing staff will not be able to assist you with questions regarding bills from these companies.  You will be contacted with the lab results as soon as they are available. The  fastest way to get your results is to activate your My Chart account. Instructions are located on the last page of this paperwork. If you have not heard from [place] regarding the results in 2 weeks, please contact this office.    Zoster Vaccine, Recombinant injection What is this medicine? ZOSTER VACCINE (ZOS ter vak SEEN) is used to prevent shingles in adults 54 years old and over. This vaccine is not used to treat shingles or nerve pain from shingles. This medicine may be used for other purposes; ask your health care provider or pharmacist if you have questions. COMMON BRAND NAME(S): [facility] What should I tell my health care provider before I take this medicine? They need to know if you have any of these conditions:  blood disorders or disease  cancer like leukemia or lymphoma  immune system problems or therapy  an unusual or allergic reaction to vaccines, other medications, foods, dyes, or preservatives  pregnant or trying to get pregnant  breast-feeding How should I use this medicine? This vaccine is for injection in a muscle. It is given by a health care professional. Talk to your pediatrician regarding the use of this medicine in children. This medicine is not approved for use in children. Overdosage: If you think you have taken too much of this medicine contact a poison control center or emergency room at once. NOTE: This medicine is only for you. Do not share this medicine with others. What if I miss a dose? Keep appointments for  follow-up (booster) doses as directed. It is important not to miss your dose. Call your doctor or health care professional if you are unable to keep an appointment. What may interact with this medicine?  medicines that suppress your immune system  medicines to treat cancer  steroid medicines like prednisone or cortisone This list may not describe all possible interactions. Give your health care provider a list of all the medicines, herbs,  non-prescription drugs, or dietary supplements you use. Also tell them if you smoke, drink alcohol, or use illegal drugs. Some items may interact with your medicine. What should I watch for while using this medicine? Visit your doctor for regular check ups. This vaccine, like all vaccines, may not fully protect everyone. What side effects may I notice from receiving this medicine? Side effects that you should report to your doctor or health care professional as soon as possible:  allergic reactions like skin rash, itching or hives, swelling of the face, lips, or tongue  breathing problems Side effects that usually do not require medical attention (report these to your doctor or health care professional if they continue or are bothersome):  chills  headache  fever  nausea, vomiting  redness, warmth, pain, swelling or itching at site where injected  tiredness This list may not describe all possible side effects. Call your doctor for medical advice about side effects. You may report side effects to FDA at 1-800-FDA-1088. Where should I keep my medicine? This vaccine is only given in a clinic, pharmacy, doctor's office, or other health care setting and will not be stored at home. NOTE: This sheet is a summary. It may not cover all possible information. If you have questions about this medicine, talk to your doctor, pharmacist, or health care provider.  2020 Elsevier/Gold Standard (2017-07-17 13:20:30)

## 2020-01-07 NOTE — ED Provider Notes (Signed)
[facility] DEPT Provider Note   CSN: [id number] Arrival date & time: 01/07/20  [id number]     History Chief Complaint  Patient presents with  . Shortness of Breath  . dry mouth    Bobby Mcguire is a 54 y.o. male.  The history is provided by the patient.  Shortness of Breath Severity:  Mild Onset quality:  Gradual Timing:  Intermittent Progression:  Waxing and waning Chronicity:  New Context: URI   Relieved by:  Nothing Worsened by:  Nothing Ineffective treatments: afrin. Associated symptoms: no abdominal pain, no chest pain, no cough, no ear pain, no fever, no rash, no sore throat, no sputum production and no vomiting   Risk factors: hx of cancer        Past Medical History:  Diagnosis Date  . Hypertension   . Prostate cancer ([place])   . Prostate cancer ([place])   . Sleep apnea     Patient Active Problem List   Diagnosis Date Noted  . Malignant neoplasm of prostate ([place]) 08/06/2019  . Morbid (severe) obesity due to excess calories ([place]) 02/13/2019  . Osteoarthritis of knee 01/09/2018  . Erectile dysfunction 09/19/2017  . Essential hypertension 06/22/2017  . Severe obstructive sleep apnea-hypopnea syndrome 05/07/2010    Past Surgical History:  Procedure Laterality Date  . PROSTATE BIOPSY         Family History  Problem Relation Age of Onset  . Colon cancer Mother   . Breast cancer Neg Hx   . Prostate cancer Neg Hx     Social History   Tobacco Use  . Smoking status: Never Smoker  . Smokeless tobacco: Never Used  Substance Use Topics  . Alcohol use: Yes    Alcohol/week: 3.0 standard drinks    Types: 3 Glasses of wine per week    Comment: reports he drinks a beer on occasion  . Drug use: No    Home Medications Prior to Admission medications   Medication Sig Start Date End Date Taking? Authorizing Provider  amLODipine (NORVASC) 10 MG tablet Take 1 tablet (10 mg total) by mouth daily. 07/22/19 10/20/19  [name], MD  azithromycin (ZITHROMAX) 250 MG tablet Take 2 tablets on first day, then take 1 tablet daily. Finish entire supply 12/31/19   Maximiano Coss, NP  dextromethorphan 15 MG/5ML syrup Take 10 mLs (30 mg total) by mouth 4 (four) times daily as needed for cough. 12/31/19   Maximiano Coss, NP  fluticasone (FLONASE) 50 MCG/ACT nasal spray Place 2 sprays into both nostrils daily. 12/31/19   Maximiano Coss, NP  ibuprofen (ADVIL,MOTRIN) 800 MG tablet ibuprofen 800 mg tablet  TAKE 2 TABLETS TWICE A DAY    [provider]  losartan (COZAAR) 100 MG tablet TAKE 1 TABLET DAILY 01/05/20   [name], MD  oxymetazoline (AFRIN NASAL SPRAY) 0.05 % nasal spray Place 1 spray into both nostrils 2 (two) times daily. 12/31/19   Maximiano Coss, NP  Promethazine-Codeine 6.25-10 MG/5ML SOLN Take 5 mLs by mouth at bedtime. 12/31/19   Maximiano Coss, NP  rosuvastatin (CRESTOR) 20 MG tablet Take 1 tablet (20 mg total) by mouth daily. 10/09/19   [name], MD  sildenafil (VIAGRA) 100 MG tablet TAKE ONE-HALF (1/2) TO ONE TABLET DAILY AS NEEDED FOR ERECTILE DYSFUNCTION Patient not taking: Reported on 01/02/2020 06/06/19   [name], MD    Allergies    No known allergies  Review of Systems   Review of Systems  Constitutional: Negative for chills and fever.  HENT: Positive for congestion. Negative for ear pain and sore throat.        Dry mouth and throat  Eyes: Negative for pain and visual disturbance.  Respiratory: Positive for shortness of breath. Negative for cough and sputum production.   Cardiovascular: Negative for chest pain and palpitations.  Gastrointestinal: Negative for abdominal pain and vomiting.  Genitourinary: Negative for dysuria and hematuria.  Musculoskeletal: Negative for arthralgias and back pain.  Skin: Negative for color change and rash.  Neurological: Negative for seizures and syncope.  All other systems reviewed and are negative.   Physical  Exam Updated Vital Signs  ED Triage Vitals  Enc Vitals Group     BP 01/07/20 0724 (!) 166/92     Pulse Rate 01/07/20 0724 96     Resp 01/07/20 0724 17     Temp 01/07/20 0724 99.2 F (37.3 C)     Temp Source 01/07/20 0724 Oral     SpO2 01/07/20 0724 100 %     Weight --      Height --      Head Circumference --      Peak Flow --      Pain Score 01/07/20 0723 0     Pain Loc --      Pain Edu? --      Excl. in [place]? --     Physical Exam Vitals and nursing note reviewed.  Constitutional:      General: He is not in acute distress.    Appearance: He is well-developed. He is not ill-appearing.  HENT:     Head: Normocephalic and atraumatic.     Mouth/Throat:     Mouth: Mucous membranes are moist.     Pharynx: Oropharynx is clear. No oropharyngeal exudate.  Eyes:     Conjunctiva/sclera: Conjunctivae normal.     Pupils: Pupils are equal, round, and reactive to light.  Cardiovascular:     Rate and Rhythm: Normal rate and regular rhythm.     Pulses: Normal pulses.     Heart sounds: Normal heart sounds. No murmur.  Pulmonary:     Effort: Pulmonary effort is normal. No respiratory distress.     Breath sounds: Normal breath sounds. No decreased breath sounds.  Abdominal:     Palpations: Abdomen is soft.     Tenderness: There is no abdominal tenderness.  Musculoskeletal:        General: Normal range of motion.     Cervical back: Normal range of motion and neck supple.  Skin:    General: Skin is warm and dry.     Capillary Refill: Capillary refill takes less than 2 seconds.  Neurological:     General: No focal deficit present.     Mental Status: He is alert.     ED Results / Procedures / Treatments   Labs (all labs ordered are listed, but only abnormal results are displayed) Labs Reviewed  NOVEL CORONAVIRUS, NAA (HOSP ORDER, SEND-OUT TO REF LAB; TAT 18-24 HRS)    EKG None  Radiology No results found.  Procedures Procedures (including critical care  time)  Medications Ordered in ED Medications - No data to display  ED Course  I have reviewed the triage vital signs and the nursing notes.  Pertinent labs & imaging results that were available during my care of the patient were reviewed by me and considered in my medical decision making (see chart for details).    MDM Rules/Calculators/A&P  Viyan D Mckeag is a 54 year old male with history of sleep apnea, hypertension, prostate cancer in remission who presents to the ED with shortness of breath, dry mouth.  Patient concerned that he may have coronavirus.  Overall unremarkable vitals.  Normal work of breathing.  Normal room air oxygenation and normal oxygenation with ambulation.  Sleeps with a CPAP at night.  Symptoms worse in the morning.  Suspect that he might just feel dry from using CPAP.  He has been using Afrin.  Overall he does not have shortness of breath or chest pain.  Doubt cardiac process.  No concern for PE.  Overall patient appears well.  Just finished a course of antibiotics as well.  Given reassurance.  Will recheck for coronavirus.  Overall recommend close follow-up with primary care doctor.  Recommend follow-up with primary care doctor and discharged in ED in good condition.  This chart was dictated using voice recognition software.  Despite best efforts to proofread,  errors can occur which can change the documentation meaning.     Final Clinical Impression(s) / ED Diagnoses Final diagnoses:  Flu-like symptoms    Rx / DC Orders ED Discharge Orders    None       Lennice Sites, DO 01/07/20 [id number]

## 2020-01-08 ENCOUNTER — Encounter: Payer: Self-pay | Admitting: Registered Nurse

## 2020-01-08 ENCOUNTER — Telehealth: Payer: Self-pay | Admitting: *Deleted

## 2020-01-08 DIAGNOSIS — E781 Pure hyperglyceridemia: Secondary | ICD-10-CM

## 2020-01-08 DIAGNOSIS — R7401 Elevation of levels of liver transaminase levels: Secondary | ICD-10-CM

## 2020-01-08 DIAGNOSIS — R198 Other specified symptoms and signs involving the digestive system and abdomen: Secondary | ICD-10-CM

## 2020-01-08 DIAGNOSIS — G4733 Obstructive sleep apnea (adult) (pediatric): Secondary | ICD-10-CM | POA: Diagnosis not present

## 2020-01-08 DIAGNOSIS — R0989 Other specified symptoms and signs involving the circulatory and respiratory systems: Secondary | ICD-10-CM

## 2020-01-08 LAB — COMPREHENSIVE METABOLIC PANEL
ALT: 63 IU/L — ABNORMAL HIGH (ref 0–44)
AST: 32 IU/L (ref 0–40)
Albumin/Globulin Ratio: 1.5 (ref 1.2–2.2)
Albumin: 4.6 g/dL (ref 3.8–4.9)
Alkaline Phosphatase: 75 IU/L (ref 39–117)
BUN/Creatinine Ratio: 13 (ref 9–20)
BUN: 12 mg/dL (ref 6–24)
Bilirubin Total: 0.5 mg/dL (ref 0.0–1.2)
CO2: 22 mmol/L (ref 20–29)
Calcium: 10.4 mg/dL — ABNORMAL HIGH (ref 8.7–10.2)
Chloride: 104 mmol/L (ref 96–106)
Creatinine, Ser: 0.9 mg/dL (ref 0.76–1.27)
GFR calc Af Amer: 112 mL/min/{1.73_m2} (ref >59)
GFR calc non Af Amer: 97 mL/min/{1.73_m2} (ref >59)
Globulin, Total: 3 g/dL (ref 1.5–4.5)
Glucose: 112 mg/dL — ABNORMAL HIGH (ref 65–99)
Potassium: 4 mmol/L (ref 3.5–5.2)
Sodium: 142 mmol/L (ref 134–144)
Total Protein: 7.6 g/dL (ref 6.0–8.5)

## 2020-01-08 LAB — LIPID PANEL
Chol/HDL Ratio: 2.8 ratio (ref 0.0–5.0)
Cholesterol, Total: 134 mg/dL (ref 100–199)
HDL: 48 mg/dL (ref >39)
LDL Chol Calc (NIH): 53 mg/dL (ref 0–99)
Triglycerides: 206 mg/dL — ABNORMAL HIGH (ref 0–149)
VLDL Cholesterol Cal: 33 mg/dL (ref 5–40)

## 2020-01-08 LAB — NOVEL CORONAVIRUS, NAA (HOSP ORDER, SEND-OUT TO REF LAB; TAT 18-24 HRS): SARS-CoV-2, NAA: NOT DETECTED

## 2020-01-08 LAB — TSH: TSH: 0.852 u[IU]/mL (ref 0.450–4.500)

## 2020-01-08 NOTE — Progress Notes (Signed)
Results returned Overall no major concerns,  Elevated triglycerides and mild elevation in ALT Will plan to repeat in 3-4 weeks ENT ref sent  Kathrin Ruddy, NP

## 2020-01-08 NOTE — Telephone Encounter (Signed)
On 01-08-20 faxed office note 01-02-20 to [facility].

## 2020-01-14 DIAGNOSIS — R1314 Dysphagia, pharyngoesophageal phase: Secondary | ICD-10-CM | POA: Diagnosis not present

## 2020-01-14 DIAGNOSIS — K219 Gastro-esophageal reflux disease without esophagitis: Secondary | ICD-10-CM | POA: Diagnosis not present

## 2020-01-14 DIAGNOSIS — G4733 Obstructive sleep apnea (adult) (pediatric): Secondary | ICD-10-CM | POA: Diagnosis not present

## 2020-01-16 ENCOUNTER — Other Ambulatory Visit: Payer: Self-pay

## 2020-01-16 ENCOUNTER — Encounter (INDEPENDENT_AMBULATORY_CARE_PROVIDER_SITE_OTHER): Payer: Self-pay | Admitting: Otolaryngology

## 2020-01-16 ENCOUNTER — Ambulatory Visit (INDEPENDENT_AMBULATORY_CARE_PROVIDER_SITE_OTHER): Payer: BC Managed Care – PPO | Admitting: Otolaryngology

## 2020-01-16 VITALS — Temp 97.9°F

## 2020-01-16 DIAGNOSIS — K219 Gastro-esophageal reflux disease without esophagitis: Secondary | ICD-10-CM | POA: Diagnosis not present

## 2020-01-16 DIAGNOSIS — J310 Chronic rhinitis: Secondary | ICD-10-CM

## 2020-01-16 NOTE — Progress Notes (Signed)
HPI: Bobby Mcguire is a 54 y.o. male who presents is referred by Dr. Orland Mustard for evaluation of globus type symptoms.  Patient stated that this initially began back in mid December.  Apparently his wife got Covid and had pneumonia.  Around that same period of time he had some nasal congestion and lost his taste for just a couple of days but did not have any substantial fever.  He isolated himself but never got tested for Covid.  He recently had a Covid test on January 13 in January 19 that were both negative.  He complains of trouble swallowing thick mucus that hangs up in his throat and points to the area of the larynx and cricoid cartilage region.  He really does not have difficulty eating food or swallowing more of just mucus getting stuck in his throat.  He was treated with omeprazole that he was taking once in the morning but still having similar problems. He has history of obstructive sleep apnea and uses CPAP. He has been prescribed Flonase but does not use this regularly but does occasionally use decongestant sprays at night if he has much nasal congestion but does not use this regularly.. He has had no weight loss.  No hoarseness. He does not smoke  Past Medical History:  Diagnosis Date  . Hypertension   . Prostate cancer ([place])   . Prostate cancer ([place])   . Sleep apnea    Past Surgical History:  Procedure Laterality Date  . PROSTATE BIOPSY     Social History   Socioeconomic History  . Marital status: Married    Spouse name: Not on file  . Number of children: 4  . Years of education: Not on file  . Highest education level: Not on file  Occupational History  . Not on file  Tobacco Use  . Smoking status: Never Smoker  . Smokeless tobacco: Never Used  Substance and Sexual Activity  . Alcohol use: Yes    Alcohol/week: 3.0 standard drinks    Types: 3 Glasses of wine per week    Comment: reports he drinks a beer on occasion  . Drug use: No  . Sexual activity: Yes  Other Topics  Concern  . Not on file  Social History Narrative   3 biological and 1 stepchild   Social Determinants of Health   Financial Resource Strain:   . Difficulty of Paying Living Expenses: Not on file  Food Insecurity:   . Worried About Charity fundraiser in the Last Year: Not on file  . Ran Out of Food in the Last Year: Not on file  Transportation Needs:   . Lack of Transportation (Medical): Not on file  . Lack of Transportation (Non-Medical): Not on file  Physical Activity:   . Days of Exercise per Week: Not on file  . Minutes of Exercise per Session: Not on file  Stress:   . Feeling of Stress : Not on file  Social Connections:   . Frequency of Communication with Friends and Family: Not on file  . Frequency of Social Gatherings with Friends and Family: Not on file  . Attends Religious Services: Not on file  . Active Member of Clubs or Organizations: Not on file  . Attends Archivist Meetings: Not on file  . Marital Status: Not on file   Family History  Problem Relation Age of Onset  . Colon cancer Mother   . Breast cancer Neg Hx   . Prostate cancer Neg  Hx    Allergies  Allergen Reactions  . No Known Allergies    Prior to Admission medications   Medication Sig Start Date End Date Taking? Authorizing Provider  azithromycin (ZITHROMAX) 250 MG tablet Take 2 tablets on first day, then take 1 tablet daily. Finish entire supply 12/31/19  Yes Maximiano Coss, NP  dextromethorphan 15 MG/5ML syrup Take 10 mLs (30 mg total) by mouth 4 (four) times daily as needed for cough. 12/31/19  Yes Maximiano Coss, NP  fluticasone (FLONASE) 50 MCG/ACT nasal spray Place 2 sprays into both nostrils daily. 12/31/19  Yes Maximiano Coss, NP  ibuprofen (ADVIL,MOTRIN) 800 MG tablet ibuprofen 800 mg tablet  TAKE 2 TABLETS TWICE A DAY   Yes [provider]  losartan (COZAAR) 100 MG tablet TAKE 1 TABLET DAILY 01/05/20  Yes Sagardia, Ines Bloomer, MD  omeprazole (PRILOSEC) 20 MG capsule Take  1 capsule (20 mg total) by mouth daily. 01/07/20  Yes Maximiano Coss, NP  oxymetazoline (AFRIN NASAL SPRAY) 0.05 % nasal spray Place 1 spray into both nostrils 2 (two) times daily. 12/31/19  Yes Maximiano Coss, NP  Promethazine-Codeine 6.25-10 MG/5ML SOLN Take 5 mLs by mouth at bedtime. 12/31/19  Yes Maximiano Coss, NP  rosuvastatin (CRESTOR) 20 MG tablet Take 1 tablet (20 mg total) by mouth daily. 10/09/19  Yes K[place]mling, Ines Bloomer, MD  sildenafil (VIAGRA) 100 MG tablet TAKE ONE-HALF (1/2) TO ONE TABLET DAILY AS NEEDED FOR ERECTILE DYSFUNCTION 06/06/19  Yes Sagardia, Ines Bloomer, MD  traZODone (DESYREL) 50 MG tablet Take 0.5-1 tablets (25-50 mg total) by mouth at bedtime as needed for sleep. 01/07/20  Yes Maximiano Coss, NP  amLODipine (NORVASC) 10 MG tablet Take 1 tablet (10 mg total) by mouth daily. 07/22/19 10/20/19  [name], MD     Positive ROS: Otherwise negative  All other systems have been reviewed and were otherwise negative with the exception of those mentioned in the HPI and as above.  Physical Exam: Constitutional: Alert, well-appearing, no acute distress.  No airway problems. Ears: External ears without lesions or tenderness. Ear canals are clear bilaterally with intact, clear TMs.  Nasal: External nose without lesions. Septum relatively midline.  Mild rhinitis.. Clear nasal passages.  No polyps.  Both middle meatus regions were clear with no evidence of active infection. Oral: Lips and gums without lesions. Tongue and palate mucosa without lesions. Posterior oropharynx clear.  Small symmetric appearing tonsils. Fiberoptic laryngoscopy was performed to the left nostril.  Nasopharynx was clear.  Base of tongue was clear.  Epiglottis was normal.  Vocal cords were clear bilaterally.  He had moderate edema of the arytenoid mucosa but no lesions or polyps.  The laryngoscope was passed through the upper esophageal sphincter without difficulty in the upper cervical esophagus was  clear. Neck: No palpable adenopathy or masses.  Well-healed old tracheostomy scar from when he was a child Respiratory: Breathing comfortably  Skin: No facial/neck lesions or rash noted.  Laryngoscopy  Date/Time: 01/16/2020 1:50 PM Performed by: Rozetta Nunnery, MD Authorized by: Rozetta Nunnery, MD   Consent:    Consent obtained:  Verbal   Consent given by:  Patient   Risks discussed:  Pain Procedure details:    Indications: hoarseness, dysphagia, or aspiration     Medication:  Afrin   Instrument: flexible fiberoptic laryngoscope     Scope location: left nare   Mouth:    Oropharynx: normal     Base of tongue: normal     Epiglottis: normal  Throat:    Pyriform sinus: normal     True vocal cords: normal   Comments:     Clear upper airway examination with normal vocal cords normal vocal cord mobility.  Moderate arytenoid edema.  Passed the laryngoscope through the upper esophageal sphincter without difficulty and patient had a clear upper cervical esophagus.    Assessment: Globus type symptoms secondary to laryngeal pharyngeal reflux Mild rhinitis  Plan: Suggested taking omeprazole 40 mg once a day before dinner as this will provide better nighttime coverage of probable silent reflux. Also suggested regular use of Flonase 2 sprays each nostril at night and only occasional use of the Afrin or decongestant nasal spray.   Radene Journey, MD   CC:

## 2020-01-17 ENCOUNTER — Other Ambulatory Visit: Payer: Self-pay | Admitting: Otolaryngology

## 2020-01-17 DIAGNOSIS — R1314 Dysphagia, pharyngoesophageal phase: Secondary | ICD-10-CM

## 2020-01-17 DIAGNOSIS — K219 Gastro-esophageal reflux disease without esophagitis: Secondary | ICD-10-CM

## 2020-01-20 ENCOUNTER — Ambulatory Visit (INDEPENDENT_AMBULATORY_CARE_PROVIDER_SITE_OTHER): Payer: BC Managed Care – PPO | Admitting: Otolaryngology

## 2020-01-21 ENCOUNTER — Other Ambulatory Visit: Payer: Self-pay

## 2020-01-21 DIAGNOSIS — J988 Other specified respiratory disorders: Secondary | ICD-10-CM

## 2020-01-21 DIAGNOSIS — F5104 Psychophysiologic insomnia: Secondary | ICD-10-CM

## 2020-01-21 DIAGNOSIS — R198 Other specified symptoms and signs involving the digestive system and abdomen: Secondary | ICD-10-CM

## 2020-01-21 DIAGNOSIS — J019 Acute sinusitis, unspecified: Secondary | ICD-10-CM

## 2020-01-21 DIAGNOSIS — R0989 Other specified symptoms and signs involving the circulatory and respiratory systems: Secondary | ICD-10-CM

## 2020-01-21 MED ORDER — OMEPRAZOLE 20 MG PO CPDR: 20.0000 mg | DELAYED_RELEASE_CAPSULE | Freq: Every day | ORAL | 3 refills | Status: DC

## 2020-01-21 MED ORDER — TRAZODONE HCL 50 MG PO TABS: 25.0000 mg | ORAL_TABLET | Freq: Every evening | ORAL | 3 refills | Status: DC | PRN

## 2020-01-21 MED ORDER — FLUTICASONE PROPIONATE 50 MCG/ACT NA SUSP: 2.0000 | Freq: Every day | NASAL | 6 refills | Status: DC

## 2020-01-27 ENCOUNTER — Ambulatory Visit
Admission: RE | Admit: 2020-01-27 | Discharge: 2020-01-27 | Disposition: A | Discharge status: Home / Self Care | Payer: BC Managed Care – PPO | Source: Ambulatory Visit | Attending: Otolaryngology | Admitting: Otolaryngology

## 2020-01-27 DIAGNOSIS — K219 Gastro-esophageal reflux disease without esophagitis: Secondary | ICD-10-CM

## 2020-01-27 DIAGNOSIS — R1314 Dysphagia, pharyngoesophageal phase: Secondary | ICD-10-CM

## 2020-02-13 ENCOUNTER — Ambulatory Visit: Payer: BC Managed Care – PPO | Attending: Internal Medicine

## 2020-02-13 DIAGNOSIS — Z23 Encounter for immunization: Secondary | ICD-10-CM | POA: Insufficient documentation

## 2020-02-13 NOTE — Progress Notes (Signed)
   Covid-19 Vaccination Clinic  Name:  Bobby Mcguire    MRN: [id number] DOB: [birth year]  02/13/2020  [name] was observed post Covid-19 immunization for 15 minutes without incidence. He was provided with Vaccine Information Sheet and instruction to access the V-Safe system.   [name] was instructed to call 911 with any severe reactions post vaccine: Marland Kitchen Difficulty breathing  . Swelling of your face and throat  . A fast heartbeat  . A bad rash all over your body  . Dizziness and weakness    Immunizations Administered    Name Date Dose VIS Date Route   Pfizer COVID-19 Vaccine 02/13/2020 11:28 AM 0.3 mL 11/29/2019 Intramuscular   Manufacturer: [place]   Lot: [id number]   [place]: [id number]

## 2020-02-17 ENCOUNTER — Other Ambulatory Visit: Payer: Self-pay

## 2020-02-17 ENCOUNTER — Encounter (INDEPENDENT_AMBULATORY_CARE_PROVIDER_SITE_OTHER): Payer: Self-pay | Admitting: Otolaryngology

## 2020-02-17 ENCOUNTER — Ambulatory Visit (INDEPENDENT_AMBULATORY_CARE_PROVIDER_SITE_OTHER): Payer: BC Managed Care – PPO | Admitting: Otolaryngology

## 2020-02-17 VITALS — Temp 96.6°F

## 2020-02-17 DIAGNOSIS — K219 Gastro-esophageal reflux disease without esophagitis: Secondary | ICD-10-CM

## 2020-02-17 NOTE — Progress Notes (Signed)
HPI: ARBEN KARAGIANNIS is a 54 y.o. male who returns today for evaluation of throat symptoms.  He still feels like he has something in the throat although it feels better.  He shows me a picture of some mucousy cough out of his throat that occurs mostly in the mornings when he clears his throat.  This shows a small slightly yellow glob of mucus.  He does not have any trouble breathing and no real chest symptoms.  Probably related to some postnasal drainage.  He sleeps with his head slightly elevated and does have obstructive sleep apnea.  He is working on losing weight..  Patient apparently had a swallow study recently that was reportedly normal.  Past Medical History:  Diagnosis Date  . Hypertension   . Prostate cancer ([place])   . Prostate cancer ([place])   . Sleep apnea    Past Surgical History:  Procedure Laterality Date  . PROSTATE BIOPSY     Social History   Socioeconomic History  . Marital status: Married    Spouse name: Not on file  . Number of children: 4  . Years of education: Not on file  . Highest education level: Not on file  Occupational History  . Not on file  Tobacco Use  . Smoking status: Never Smoker  . Smokeless tobacco: Never Used  Substance and Sexual Activity  . Alcohol use: Yes    Alcohol/week: 3.0 standard drinks    Types: 3 Glasses of wine per week    Comment: reports he drinks a beer on occasion  . Drug use: No  . Sexual activity: Yes  Other Topics Concern  . Not on file  Social History Narrative   3 biological and 1 stepchild   Social Determinants of Health   Financial Resource Strain:   . Difficulty of Paying Living Expenses: Not on file  Food Insecurity:   . Worried About Charity fundraiser in the Last Year: Not on file  . Ran Out of Food in the Last Year: Not on file  Transportation Needs:   . Lack of Transportation (Medical): Not on file  . Lack of Transportation (Non-Medical): Not on file  Physical Activity:   . Days of Exercise per Week: Not  on file  . Minutes of Exercise per Session: Not on file  Stress:   . Feeling of Stress : Not on file  Social Connections:   . Frequency of Communication with Friends and Family: Not on file  . Frequency of Social Gatherings with Friends and Family: Not on file  . Attends Religious Services: Not on file  . Active Member of Clubs or Organizations: Not on file  . Attends Archivist Meetings: Not on file  . Marital Status: Not on file   Family History  Problem Relation Age of Onset  . Colon cancer Mother   . Breast cancer Neg Hx   . Prostate cancer Neg Hx    Allergies  Allergen Reactions  . No Known Allergies    Prior to Admission medications   Medication Sig Start Date End Date Taking? Authorizing Provider  azithromycin (ZITHROMAX) 250 MG tablet Take 2 tablets on first day, then take 1 tablet daily. Finish entire supply 12/31/19  Yes Maximiano Coss, NP  dextromethorphan 15 MG/5ML syrup Take 10 mLs (30 mg total) by mouth 4 (four) times daily as needed for cough. 12/31/19  Yes Maximiano Coss, NP  fluticasone (FLONASE) 50 MCG/ACT nasal spray Place 2 sprays into both nostrils  daily. 01/21/20  Yes Maximiano Coss, NP  ibuprofen (ADVIL,MOTRIN) 800 MG tablet ibuprofen 800 mg tablet  TAKE 2 TABLETS TWICE A DAY   Yes [provider]  losartan (COZAAR) 100 MG tablet TAKE 1 TABLET DAILY 01/05/20  Yes Sagardia, Ines Bloomer, MD  omeprazole (PRILOSEC) 20 MG capsule Take 1 capsule (20 mg total) by mouth daily. 01/21/20  Yes Maximiano Coss, NP  oxymetazoline (AFRIN NASAL SPRAY) 0.05 % nasal spray Place 1 spray into both nostrils 2 (two) times daily. 12/31/19  Yes Maximiano Coss, NP  Promethazine-Codeine 6.25-10 MG/5ML SOLN Take 5 mLs by mouth at bedtime. 12/31/19  Yes Maximiano Coss, NP  rosuvastatin (CRESTOR) 20 MG tablet Take 1 tablet (20 mg total) by mouth daily. 10/09/19  Yes [place], Ines Bloomer, MD  sildenafil (VIAGRA) 100 MG tablet TAKE ONE-HALF (1/2) TO ONE TABLET DAILY AS  NEEDED FOR ERECTILE DYSFUNCTION 06/06/19  Yes Sagardia, Ines Bloomer, MD  traZODone (DESYREL) 50 MG tablet Take 0.5-1 tablets (25-50 mg total) by mouth at bedtime as needed for sleep. 01/21/20  Yes Maximiano Coss, NP  amLODipine (NORVASC) 10 MG tablet Take 1 tablet (10 mg total) by mouth daily. 07/22/19 10/20/19  [name], MD     Positive ROS: Otherwise negative  All other systems have been reviewed and were otherwise negative with the exception of those mentioned in the HPI and as above.  Physical Exam: Constitutional: Alert, well-appearing, no acute distress Ears: External ears without lesions or tenderness. Ear canals are clear bilaterally with intact, clear TMs.  Nasal: External nose without lesions. Septum relatively midline.. Clear nasal passages.  Both middle meatus regions were clear with no evidence of acute infection or mucopurulent discharge. Oral: Lips and gums without lesions. Tongue and palate mucosa without lesions. Posterior oropharynx clear. Neck: No palpable adenopathy or masses Respiratory: Breathing comfortably  Skin: No facial/neck lesions or rash noted.  Procedures  Assessment: Globus symptoms most likely related to GERD.  Plan: Patient has been on omeprazole 40 mg daily before dinner for the past 4 weeks. Recommended continue with the omeprazole 40 mg daily for another 2 weeks and then reduce to 20 mg daily before dinner for another 4 weeks.  And then try to stop the antiacid medication altogether. Encouraged him on weight loss. He will continue with the steroid nasal spray at night. He will follow-up as needed.   Radene Journey, MD

## 2020-02-19 ENCOUNTER — Other Ambulatory Visit: Payer: Self-pay

## 2020-02-19 ENCOUNTER — Other Ambulatory Visit: Payer: Self-pay | Admitting: Registered Nurse

## 2020-02-19 ENCOUNTER — Encounter: Payer: Self-pay | Admitting: Registered Nurse

## 2020-02-19 ENCOUNTER — Ambulatory Visit (INDEPENDENT_AMBULATORY_CARE_PROVIDER_SITE_OTHER): Payer: BC Managed Care – PPO | Admitting: Registered Nurse

## 2020-02-19 VITALS — BP 134/74 | HR 72 | Temp 97.3°F | Ht 74.0 in | Wt 323.4 lb

## 2020-02-19 DIAGNOSIS — E781 Pure hyperglyceridemia: Secondary | ICD-10-CM | POA: Diagnosis not present

## 2020-02-19 DIAGNOSIS — R0989 Other specified symptoms and signs involving the circulatory and respiratory systems: Secondary | ICD-10-CM

## 2020-02-19 DIAGNOSIS — R198 Other specified symptoms and signs involving the digestive system and abdomen: Secondary | ICD-10-CM

## 2020-02-19 DIAGNOSIS — R0602 Shortness of breath: Secondary | ICD-10-CM

## 2020-02-19 DIAGNOSIS — R7401 Elevation of levels of liver transaminase levels: Secondary | ICD-10-CM | POA: Diagnosis not present

## 2020-02-19 DIAGNOSIS — R1310 Dysphagia, unspecified: Secondary | ICD-10-CM

## 2020-02-19 NOTE — Patient Instructions (Signed)
° ° ° °  If you have lab work done today you will be contacted with your lab results within the next 2 weeks.  If you have not heard from us then please contact us. The fastest way to get your results is to register for My Chart. ° ° °IF you received an x-ray today, you will receive an invoice from [facility]. Please contact  Radiology at [phone] with questions or concerns regarding your invoice.  ° °IF you received labwork today, you will receive an invoice from [facility]. Please contact [facility] at [phone] with questions or concerns regarding your invoice.  ° °Our billing staff will not be able to assist you with questions regarding bills from these companies. ° °You will be contacted with the lab results as soon as they are available. The fastest way to get your results is to activate your My Chart account. Instructions are located on the last page of this paperwork. If you have not heard from us regarding the results in 2 weeks, please contact this office. °  ° ° ° °

## 2020-02-19 NOTE — Progress Notes (Signed)
Established Patient Office Visit  Subjective:  Patient ID: Bobby Mcguire, male    DOB: [birth year]  Age: 54 y.o. MRN: [id number]  CC:  Chief Complaint  Patient presents with  . Follow-up    omperazole , and questions about his liver as well.    HPI Bobby Mcguire presents for follow up from ENT visit  He was seen by his ENT at [facility] as well as Dr. Lucia Gaskins. Both agreed that his globus sensation is likely related to a silent reflux, likely occurring at night. Plan including decreased EtOH intake, weight loss, continued compliance with CPAP.   Pt has followed plan, cutting out beer and losing 7 lbs since his last visit with me. Has been taking omeprazole 40mg  PO qd before dinner with good effect. Some lingering globus sensation but had endoscopy that was reassuring. Will continue omeprazole at 40mg  PO qd for two more weeks before decreasing to 20mg  Po qd for one month  Otherwise, wants to repeat his lipids and LFTs that had abnormal values at last visit. Reassured him that these were likely due to not fasting and EtOH use, and that they would likely return to normal limits today.   He also has questions about the COVID vaccine - he received his first dose of the [employer] vaccine.  Otherwise, feeling well overall. Endorses great changes in his diet and lifestyle to manage his weight. He is hoping to lose 50-60lbs this year.   Past Medical History:  Diagnosis Date  . Hypertension   . Prostate cancer ([place])   . Prostate cancer ([place])   . Sleep apnea     Past Surgical History:  Procedure Laterality Date  . PROSTATE BIOPSY      Family History  Problem Relation Age of Onset  . Colon cancer Mother   . Breast cancer Neg Hx   . Prostate cancer Neg Hx     Social History   Socioeconomic History  . Marital status: Married    Spouse name: Not on file  . Number of children: 4  . Years of education: Not on file  . Highest education level: Not on file  Occupational History  .  Not on file  Tobacco Use  . Smoking status: Never Smoker  . Smokeless tobacco: Never Used  Substance and Sexual Activity  . Alcohol use: Yes    Alcohol/week: 3.0 standard drinks    Types: 3 Glasses of wine per week    Comment: reports he drinks a beer on occasion  . Drug use: No  . Sexual activity: Yes  Other Topics Concern  . Not on file  Social History Narrative   3 biological and 1 stepchild   Social Determinants of Health   Financial Resource Strain:   . Difficulty of Paying Living Expenses: Not on file  Food Insecurity:   . Worried About Charity fundraiser in the Last Year: Not on file  . Ran Out of Food in the Last Year: Not on file  Transportation Needs:   . Lack of Transportation (Medical): Not on file  . Lack of Transportation (Non-Medical): Not on file  Physical Activity:   . Days of Exercise per Week: Not on file  . Minutes of Exercise per Session: Not on file  Stress:   . Feeling of Stress : Not on file  Social Connections:   . Frequency of Communication with Friends and Family: Not on file  . Frequency of Social Gatherings with Friends and  Family: Not on file  . Attends Religious Services: Not on file  . Active Member of Clubs or Organizations: Not on file  . Attends Archivist Meetings: Not on file  . Marital Status: Not on file  Intimate Partner Violence:   . Fear of Current or Ex-Partner: Not on file  . Emotionally Abused: Not on file  . Physically Abused: Not on file  . Sexually Abused: Not on file    Outpatient Medications Prior to Visit  Medication Sig Dispense Refill  . azithromycin (ZITHROMAX) 250 MG tablet Take 2 tablets on first day, then take 1 tablet daily. Finish entire supply 6 tablet 0  . dextromethorphan 15 MG/5ML syrup Take 10 mLs (30 mg total) by mouth 4 (four) times daily as needed for cough. 120 mL 0  . fluticasone (FLONASE) 50 MCG/ACT nasal spray Place 2 sprays into both nostrils daily. 16 g 6  . ibuprofen (ADVIL,MOTRIN)  800 MG tablet ibuprofen 800 mg tablet  TAKE 2 TABLETS TWICE A DAY    . losartan (COZAAR) 100 MG tablet TAKE 1 TABLET DAILY 90 tablet 1  . omeprazole (PRILOSEC) 20 MG capsule Take 1 capsule (20 mg total) by mouth daily. 30 capsule 3  . oxymetazoline (AFRIN NASAL SPRAY) 0.05 % nasal spray Place 1 spray into both nostrils 2 (two) times daily. 30 mL 0  . Promethazine-Codeine 6.25-10 MG/5ML SOLN Take 5 mLs by mouth at bedtime. 280 mL 1  . rosuvastatin (CRESTOR) 20 MG tablet Take 1 tablet (20 mg total) by mouth daily. 90 tablet 1  . sildenafil (VIAGRA) 100 MG tablet TAKE ONE-HALF (1/2) TO ONE TABLET DAILY AS NEEDED FOR ERECTILE DYSFUNCTION 30 tablet 5  . traZODone (DESYREL) 50 MG tablet Take 0.5-1 tablets (25-50 mg total) by mouth at bedtime as needed for sleep. 90 tablet 3  . amLODipine (NORVASC) 10 MG tablet Take 1 tablet (10 mg total) by mouth daily. 90 tablet 3   No facility-administered medications prior to visit.    Allergies  Allergen Reactions  . No Known Allergies     ROS Review of Systems  Constitutional: Negative.   HENT: Negative.   Eyes: Negative.   Respiratory: Negative.   Cardiovascular: Negative.   Gastrointestinal: Negative.   Endocrine: Negative.   Genitourinary: Negative.   Musculoskeletal: Negative.   Skin: Negative.   Allergic/Immunologic: Negative.   Neurological: Negative.   Hematological: Negative.   Psychiatric/Behavioral: Negative.   All other systems reviewed and are negative.     Objective:    Physical Exam  Constitutional: He is oriented to person, place, and time. He appears well-developed and well-nourished. No distress.  Cardiovascular: Normal rate and regular rhythm.  Pulmonary/Chest: Effort normal. No respiratory distress.  Neurological: He is alert and oriented to person, place, and time.  Skin: Skin is warm and dry. No rash noted. He is not diaphoretic. No erythema. No pallor.  Psychiatric: He has a normal mood and affect. His behavior is  normal. Judgment and thought content normal.  Nursing note and vitals reviewed.   BP 134/74   Pulse 72   Temp (!) 97.3 F (36.3 C) (Temporal)   Ht 6\' 2"  (1.88 m)   Wt (!) 323 lb 6.4 oz (146.7 kg)   SpO2 99%   BMI 41.52 kg/m  Wt Readings from Last 3 Encounters:  02/19/20 (!) 323 lb 6.4 oz (146.7 kg)  01/07/20 (!) 333 lb (151 kg)  12/31/19 (!) 326 lb (147.9 kg)     There are  no preventive care reminders to display for this patient.  There are no preventive care reminders to display for this patient.  Lab Results  Component Value Date   TSH 0.852 01/07/2020   Lab Results  Component Value Date   WBC 9.3 05/23/2019   HGB 14.2 05/23/2019   HCT 42.9 05/23/2019   MCV 99 (H) 05/23/2019   PLT 331 05/23/2019   Lab Results  Component Value Date   NA 142 01/07/2020   K 4.0 01/07/2020   CO2 22 01/07/2020   GLUCOSE 112 (H) 01/07/2020   BUN 12 01/07/2020   CREATININE 0.90 01/07/2020   BILITOT 0.5 01/07/2020   ALKPHOS 75 01/07/2020   AST 32 01/07/2020   ALT 63 (H) 01/07/2020   PROT 7.6 01/07/2020   ALBUMIN 4.6 01/07/2020   CALCIUM 10.4 (H) 01/07/2020   ANIONGAP 11 08/05/2018   GFR 89.54 01/07/2019   Lab Results  Component Value Date   CHOL 134 01/07/2020   Lab Results  Component Value Date   HDL 48 01/07/2020   Lab Results  Component Value Date   LDLCALC 53 01/07/2020   Lab Results  Component Value Date   TRIG 206 (H) 01/07/2020   Lab Results  Component Value Date   CHOLHDL 2.8 01/07/2020   Lab Results  Component Value Date   HGBA1C 5.0 11/05/2018      Assessment & Plan:   Problem List Items Addressed This Visit    None    Visit Diagnoses    Globus sensation    -  Primary   High triglycerides       Elevated ALT measurement          No orders of the defined types were placed in this encounter.   Follow-up: No follow-ups on file.   PLAN  Drawing repeat labs, will follow up as warranted  Continue great diet choices. Weight loss will  improve sleep apnea, knee pain, and GERD.  Return to clinic within 1 year for CPE and labs  Reassured him that the vaccine is safe and effective - the second dose may give some side effects but nothing serious.  Patient encouraged to call clinic with any questions, comments, or concerns.  Maximiano Coss, NP

## 2020-02-20 ENCOUNTER — Encounter: Payer: Self-pay | Admitting: Registered Nurse

## 2020-02-20 LAB — HEPATIC FUNCTION PANEL
ALT: 28 IU/L (ref 0–44)
AST: 15 IU/L (ref 0–40)
Albumin: 4.6 g/dL (ref 3.8–4.9)
Alkaline Phosphatase: 78 IU/L (ref 39–117)
Bilirubin Total: 0.5 mg/dL (ref 0.0–1.2)
Bilirubin, Direct: 0.15 mg/dL (ref 0.00–0.40)
Total Protein: 7.2 g/dL (ref 6.0–8.5)

## 2020-02-20 LAB — LIPID PANEL
Chol/HDL Ratio: 3.2 ratio (ref 0.0–5.0)
Cholesterol, Total: 132 mg/dL (ref 100–199)
HDL: 41 mg/dL (ref >39)
LDL Chol Calc (NIH): 77 mg/dL (ref 0–99)
Triglycerides: 66 mg/dL (ref 0–149)
VLDL Cholesterol Cal: 14 mg/dL (ref 5–40)

## 2020-02-29 ENCOUNTER — Encounter: Payer: Self-pay | Admitting: Podiatry

## 2020-03-02 ENCOUNTER — Telehealth: Payer: Self-pay | Admitting: Neurology

## 2020-03-02 NOTE — Telephone Encounter (Signed)
Pt stopped by today with questions regarding new sleep machine. He states it is the same as his machine before, but he got it as a "back up". He is requesting a time for Dr. Brett Fairy to fix the pressure in the new machine as she did for the old one. Please advise.

## 2020-03-02 NOTE — Telephone Encounter (Signed)
Called the pt back and advised the pt that he is due for a yearly apt. Informed him that he can bring the old machine with him at that time and have Dr Dohmeier look at that. Patient verbalized check in at 3 p for a 3:30 follow up.

## 2020-03-04 ENCOUNTER — Ambulatory Visit: Payer: BC Managed Care – PPO | Attending: Internal Medicine

## 2020-03-04 DIAGNOSIS — Z23 Encounter for immunization: Secondary | ICD-10-CM

## 2020-03-04 NOTE — Progress Notes (Signed)
   Covid-19 Vaccination Clinic  Name:  Bobby Mcguire    MRN: [id number] DOB: 04-Oct-1966  03/04/2020  Bobby Mcguire was observed post Covid-19 immunization for 15 minutes without incident. He was provided with Vaccine Information Sheet and instruction to access the V-Safe system.   Bobby Mcguire was instructed to call 911 with any severe reactions post vaccine: [place] Kitchen Difficulty breathing  . Swelling of face and throat  . A fast heartbeat  . A bad rash all over body  . Dizziness and weakness   Immunizations Administered    Name Date Dose VIS Date Route   Pfizer COVID-19 Vaccine 03/04/2020  1:03 PM 0.3 mL 11/29/2019 Intramuscular   Manufacturer: [place]   Lot: [id number]   [place]: [id number]

## 2020-03-05 ENCOUNTER — Ambulatory Visit: Payer: Self-pay | Admitting: Neurology

## 2020-03-06 ENCOUNTER — Encounter: Payer: Self-pay | Admitting: Medical Oncology

## 2020-03-06 ENCOUNTER — Encounter: Payer: Self-pay | Admitting: Neurology

## 2020-03-09 ENCOUNTER — Telehealth: Payer: Self-pay | Admitting: Medical Oncology

## 2020-03-09 NOTE — Telephone Encounter (Signed)
Spoke with patient regarding follow up. I reminded him that he returns to Dr. Karsten Ro for PSA and follow up. Ashlyn, discussed this with him 1/14, but has not made an appointment. I discussed the importance follow care and monitoring his PSA. He voiced understanding and states he will call [facility] to get an appointment. I asked him to call me back if I can assist him.

## 2020-03-10 DIAGNOSIS — Z8546 Personal history of malignant neoplasm of prostate: Secondary | ICD-10-CM | POA: Diagnosis not present

## 2020-03-17 DIAGNOSIS — R232 Flushing: Secondary | ICD-10-CM | POA: Diagnosis not present

## 2020-03-17 DIAGNOSIS — Z8546 Personal history of malignant neoplasm of prostate: Secondary | ICD-10-CM | POA: Diagnosis not present

## 2020-03-19 ENCOUNTER — Other Ambulatory Visit: Payer: Self-pay | Admitting: Emergency Medicine

## 2020-03-19 DIAGNOSIS — E785 Hyperlipidemia, unspecified: Secondary | ICD-10-CM

## 2020-04-01 ENCOUNTER — Encounter (INDEPENDENT_AMBULATORY_CARE_PROVIDER_SITE_OTHER): Payer: Self-pay

## 2020-04-01 NOTE — Progress Notes (Unsigned)
Faxed in refill request on 03/18/2020 to [facility]. Omeprazole 40 mg caps. Quality 30. Take 1 caps before dinner QD. W/ 2 refills.  PM

## 2020-04-05 ENCOUNTER — Encounter: Payer: Self-pay | Admitting: Neurology

## 2020-04-08 ENCOUNTER — Ambulatory Visit (INDEPENDENT_AMBULATORY_CARE_PROVIDER_SITE_OTHER): Payer: BC Managed Care – PPO | Admitting: Neurology

## 2020-04-08 ENCOUNTER — Encounter: Payer: Self-pay | Admitting: Neurology

## 2020-04-08 ENCOUNTER — Other Ambulatory Visit: Payer: Self-pay

## 2020-04-08 VITALS — BP 132/78 | HR 71 | Temp 97.2°F | Ht 74.0 in | Wt 334.5 lb

## 2020-04-08 DIAGNOSIS — J3089 Other allergic rhinitis: Secondary | ICD-10-CM | POA: Diagnosis not present

## 2020-04-08 DIAGNOSIS — Z9989 Dependence on other enabling machines and devices: Secondary | ICD-10-CM

## 2020-04-08 DIAGNOSIS — G621 Alcoholic polyneuropathy: Secondary | ICD-10-CM

## 2020-04-08 DIAGNOSIS — G4733 Obstructive sleep apnea (adult) (pediatric): Secondary | ICD-10-CM | POA: Diagnosis not present

## 2020-04-08 NOTE — Patient Instructions (Signed)

## 2020-04-08 NOTE — Progress Notes (Signed)
SLEEP MEDICINE CLINIC   Provider:  Larey Seat, MD   Primary Care Physician:  Maximiano Coss, NP   Referring Provider: see above, NP   Chief Complaint  Patient presents with  . Follow-up    Room 10. Yearly follow up for OSA.    HPI:  04-08-2020.  Braydn D Belshe is a 54 y.o. male patient , seen here on 01-28-2019 in a referral for transfer of sleep care-  from NP Orland Mustard who is worried about HTN. The patient did not provide any documentation as to his previous sleep care, but allowed [place] to download his CPAP for compliance.  He lived in [place] / near [place] 20 years ago when he was diagnosed, and got the CPAP machine. He is now on his third machine.  He was seen in consultation 01-28-2019 and had a sleep study in 02-06-2019.  [facility] [place], [place] [address] NAME:  Anothny Levis                                                                             DOB: 09/15/1966 MEDICAL RECORD no:  [id number]                                                       DOS: 02/06/2019   REFERRING PHYSICIAN: Marrian Salvage, FNP STUDY PERFORMED: Home Sleep Test on Watch Pat HISTORY: Kaelin Deman Suttonis a 54 y.o.malepatientand was seenon 2-10-2020in a referral for transfer of sleep care-from NPMurray,who has been worried about the patient's HTN. The patient could not provide any documentation as to his previous sleep care, but allowed [place] to download his CPAP for compliance. He lived in [place] near [place] 20 years ago when he was diagnosed, and got the CPAP machine. He is now on his third machine, ordered in 2015 by ENT Dr. Constance Holster.  Chief complaint:He reports morbid obesity, rhinitis, and feels still daytime sleepy.Can't sleep without CPAP.   Epworth Sleepiness score; on CPAP 05/ 24points; Fatigue severity score 26/63;BMI:43.2  STUDY RESULTS:  Total Recording Time: 7 h 8 mins; Calculated Sleep Time:  5 h 53  mins Total Apnea/Hypopnea Index (AHI): 63.7 /h; RDI: 64.3 /h; REM AHI: 60.9 /h Average Oxygen Saturation:   94 %; Lowest Oxygen Desaturation:  83 %  Total Time Oxygen Saturation Below or at 88 %:  2.6 minutes  Average Heart Rate:  78 bpm (between 48 and 100 bpm) IMPRESSION: Severe Sleep Apnea, moderate snoring, no REM sleep accentuation. Minimal hypoxemia.  RECOMMENDATION: Continuation of CPAP care. If the patient's machine is now 54 years old, he would be able to obtain a new one that has autotitration capability and shall be set between 5-18 cm water with 3 cm EPR. Mask of patient's choice and comfort.  I certify that I have reviewed the raw data recording prior to the issuance of this report in accordance with the standards of the American Academy of Sleep Medicine (AASM). Larey Seat, M.D.  02-13-2019   04-08-2020: The patient  has been a compliant user of CPAP 100% for the last 30 days every night over 4 hours with an average time of 9 hours and 10 minutes of nightly use.  He is using an AutoSet which we ordered after his home sleep test last year the minimum pressure is 5 the maximum pressure 18 and the EPR level is 3 cmH2O and he has a final residual apnea index of 0.4 there are some air leaks around the masks but they seem not to influence the apnea count.  He has 2 machines- an older and the newer.  The 95th percentile pressure is 8.7 cmH2O well within the current settings and I congratulated the patient to his high compliance he endorsed the fatigue severity score at 14 and the Epworth sleepiness score at 3 out of 24 points. He would like to have his older machine set to the same pressure in cm water.  [facility]. DME.     Chief complaint according to patient : He reports morbid obesity, rhinitis, and feels still sleepy.  Sleep habits are as follows: Dinner time 6-7 Pm, later will snack. Drinks beer to help his sleep.  He goes to the Gym before 8 PM on weekends, for one hour, he  returns and plays on his computer or watches TV- in the bedroom (!) . By midnight he goes to sleep in the  bedroom that he shared with his wife, he sleeps in a recliner in order to keep his CPAP in place.  Sleep latency is less than 30 minutes, if not he uses a sleep aid. Uses afrin PRN for nasal airflow patency. The bedroom is cool, but not quiet and dark- his bedroom has 3 flex screen TVs and his wife and he both watch.  Rises at variable times, by 8 AM the family is at work and he will try to sleep another hour.  No clue about sleep hygiene, was never addressed.  He  Is today better prepared and has implemented sleep changes. Routines .  Sleep medical history: first OSA diagnosis in [place] around 2000. Obesity, rhinitis. HTN, high Cholesterol. Last CPAP issued in 2015.  Family sleep history:  No family member is affected.  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  3 Children 17, 27 and 30.   Review of Systems: Out of a complete 14 system review, the patient complains of only the following symptoms, and all other reviewed systems are negative. Loud Snoring if not using CPAP,best sleep on CPAP -   Last sleep machine ordered by Dr Constance Holster, ENT on [address].  The older machine is set for an unknown pressure- too high for comfort, but he likes the newer settings.  5-18 cm water with 3 cm EPR -   Epworth score  3 from 5/ 24 points  , Fatigue severity score  15 from 26/63   , depression score N/A    Social History   Socioeconomic History  . Marital status: Married    Spouse name: Not on file  . Number of children: 4  . Years of education: Not on file  . Highest education level: Not on file  Occupational History  . Not on file  Tobacco Use  . Smoking status: Never Smoker  . Smokeless tobacco: Never Used  Substance and Sexual Activity  . Alcohol use: Yes    Alcohol/week: 3.0 standard drinks    Types: 3 Glasses of wine per week  Comment: reports he drinks a beer on occasion  . Drug use: No  . Sexual activity: Yes  Other Topics Concern  . Not on file  Social History Narrative   3 biological and 1 stepchild   Social Determinants of Health   Financial Resource Strain:   . Difficulty of Paying Living Expenses:   Food Insecurity:   . Worried About Charity fundraiser in the Last Year:   . Arboriculturist in the Last Year:   Transportation Needs:   . Film/video editor (Medical):   Marland Kitchen Lack of Transportation (Non-Medical):   Physical Activity:   . Days of Exercise per Week:   . Minutes of Exercise per Session:   Stress:   . Feeling of Stress :   Social Connections:   . Frequency of Communication with Friends and Family:   . Frequency of Social Gatherings with Friends and Family:   . Attends Religious Services:   . Active Member of Clubs or Organizations:   . Attends Archivist Meetings:   Marland Kitchen Marital Status:   Intimate Partner Violence:   . Fear of Current or Ex-Partner:   . Emotionally Abused:   Marland Kitchen Physically Abused:   . Sexually Abused:     Family History  Problem Relation Age of Onset  . Colon cancer Mother   . Breast cancer Neg Hx   . Prostate cancer Neg Hx     Past Medical History:  Diagnosis Date  . Hypertension   . Prostate cancer ([place])   . Prostate cancer ([place])   . Sleep apnea     Past Surgical History:  Procedure Laterality Date  . PROSTATE BIOPSY      Current Outpatient Medications  Medication Sig Dispense Refill  . fluticasone (FLONASE) 50 MCG/ACT nasal spray Place 2 sprays into both nostrils daily. (Patient taking differently: Place 2 sprays into both nostrils as needed. ) 16 g 6  . ibuprofen (ADVIL,MOTRIN) 800 MG tablet ibuprofen 800 mg tablet  TAKE 2 TABLETS TWICE A DAY    . losartan (COZAAR) 100 MG tablet TAKE 1 TABLET DAILY 90 tablet 1  . rosuvastatin (CRESTOR) 20 MG tablet TAKE 1 TABLET DAILY 90 tablet 3  . sildenafil (VIAGRA) 100 MG tablet TAKE ONE-HALF  (1/2) TO ONE TABLET DAILY AS NEEDED FOR ERECTILE DYSFUNCTION 30 tablet 5  . traZODone (DESYREL) 50 MG tablet Take 0.5-1 tablets (25-50 mg total) by mouth at bedtime as needed for sleep. 90 tablet 3  . amLODipine (NORVASC) 10 MG tablet Take 1 tablet (10 mg total) by mouth daily. 90 tablet 3   No current facility-administered medications for this visit.    Allergies as of 04/08/2020 - Review Complete 04/08/2020  Allergen Reaction Noted  . No known allergies  08/06/2019    Vitals: BP 132/78   Pulse 71   Temp (!) 97.2 F (36.2 C)   Ht 6\' 2"  (1.88 m)   Wt (!) 334 lb 8 oz (151.7 kg)   BMI 42.95 kg/m  Last Weight:  Wt Readings from Last 1 Encounters:  04/08/20 (!) 334 lb 8 oz (151.7 kg)   [id number] mass index is 42.95 kg/m.     Last Height:   Ht Readings from Last 1 Encounters:  04/08/20 6\' 2"  (1.88 m)    Physical exam:  General: The patient is awake, alert and appears not in acute distress. The patient is well groomed. Head: Normocephalic, atraumatic. Neck is supple. Mallampati 5 ,  neck circumference:  20". Nasal airflow patent , Retrognathia is seen.  Cardiovascular:  Regular rate and rhythm, without  murmurs or carotid bruit, and without distended neck veins. Respiratory: Lungs are clear to auscultation. Skin:  Without evidence of edema, or rash Trunk: BMI is 43.00. The patient's posture is erect.   Neurologic exam : The patient is awake and alert, oriented to place and time.  Attention span & concentration ability appears normal. Speech is fluent,  without dysarthria, dysphonia or aphasia. Mood and affect are appropriate.  Cranial nerves: Pupils are equal and briskly reactive to light. Funduscopic exam without evidence of pallor or edema. Extraocular movements  in vertical and horizontal planes intact and without nystagmus. Visual fields by finger perimetry are intact. Hearing to finger rub intact.  Facial sensation intact to fine touch. Facial motor strength is symmetric and  tongue and uvula move midline. Shoulder shrug was symmetrical.   Motor exam: Normal tone, muscle bulk and symmetric strength in all extremities. Sensory:  Fine touch, pinprick and vibration were normal. Coordination: Finger-to-nose maneuver normal without evidence of ataxia, dysmetria or tremor. Gait and station: Patient walks without assistive device. Strength within normal limits. Stance is stable and normal.   Has reportedly knee pain.  Deep tendon reflexes: in the upper and lower extremities are symmetric and intact. Babinski maneuver response is downgoing.    Assessment:  After physical and neurologic examination, review of laboratory studies,  Personal review of imaging studies, reports of other /same  Imaging studies, results of polysomnography and / or neurophysiology testing and pre-existing records as far as provided in visit., my assessment is   1) longstanding OSA diagnosis, with main risk factor being gender, overweight  and large neck.  CPAP was almost 54 years old and was replaced.  Mr. Vansomeren has been 100% compliant CPAP user He is using an AutoSet which we ordered after his home sleep test last year the minimum pressure is 5 the maximum pressure 18 and the EPR level is 3 cmH2O and he has a final residual apnea index of 0.4 there are some air leaks around the masks but they seem not to influence the apnea count.  He has 2 machines- an older and the newer.  The 95th percentile pressure is 8.7 cmH2O well within the current settings and I congratulated the patient to his high compliance he endorsed the fatigue severity score at 14 and the Epworth sleepiness score at 3 out of 24 points. He would like to have his older machine set to the same pressure in cm water.  2) co-morbidities: HTN, Obesity, reduced exercise  Due to knee pain. I would strongly recommend a medical weight management referral.   3)  Poor dietary habits and poor sleep hygiene have improved - YES ! He implemented the  14 day boot camp ! TV on a timer.    The patient was advised of the nature of the diagnosed disorder , the treatment options and the  risks for general health and wellness arising from not treating the condition.   I spent more than 20 minutes of face to face time with the patient.  Greater than 50% of time was spent in counseling and coordination of care. We have discussed the diagnosis and differential and I answered the patient's questions.    Plan:  Treatment plan and additional workup :  Former patient of [facility] in [place], he had no sleep study in the last 12 years, the older machine is not set for him  but on factory settings and he had no fittings for interfaces.  I will ask [facility] to set the older machine to the same settings as the newer one from 2020.   Larey Seat, MD [id number], [id number]  Certified in Neurology by ABPN Certified in [place] by [facility] [address], [place], [place] [address]

## 2020-04-14 ENCOUNTER — Encounter: Payer: Self-pay | Admitting: Medical Oncology

## 2020-04-16 DIAGNOSIS — G4733 Obstructive sleep apnea (adult) (pediatric): Secondary | ICD-10-CM | POA: Diagnosis not present

## 2020-04-22 ENCOUNTER — Other Ambulatory Visit: Payer: Self-pay

## 2020-04-22 ENCOUNTER — Ambulatory Visit (INDEPENDENT_AMBULATORY_CARE_PROVIDER_SITE_OTHER): Payer: BC Managed Care – PPO | Admitting: Registered Nurse

## 2020-04-22 ENCOUNTER — Encounter: Payer: Self-pay | Admitting: Registered Nurse

## 2020-04-22 VITALS — BP 143/80 | HR 69 | Temp 97.8°F | Resp 16 | Ht 74.0 in | Wt 333.2 lb

## 2020-04-22 DIAGNOSIS — M778 Other enthesopathies, not elsewhere classified: Secondary | ICD-10-CM

## 2020-04-22 MED ORDER — METHOCARBAMOL 500 MG PO TABS: 500.0000 mg | ORAL_TABLET | Freq: Four times a day (QID) | ORAL | 0 refills | Status: DC

## 2020-04-22 MED ORDER — MELOXICAM 15 MG PO TABS: 15.0000 mg | ORAL_TABLET | Freq: Every day | ORAL | 0 refills | Status: DC

## 2020-04-22 NOTE — Patient Instructions (Signed)
° ° ° °  If you have lab work done today you will be contacted with your lab results within the next 2 weeks.  If you have not heard from us then please contact us. The fastest way to get your results is to register for My Chart. ° ° °IF you received an x-ray today, you will receive an invoice from [facility]. Please contact [facility] at [phone] with questions or concerns regarding your invoice.  ° °IF you received labwork today, you will receive an invoice from [facility]. Please contact [facility] at [phone] with questions or concerns regarding your invoice.  ° °Our billing staff will not be able to assist you with questions regarding bills from these companies. ° °You will be contacted with the lab results as soon as they are available. The fastest way to get your results is to activate your My Chart account. Instructions are located on the last page of this paperwork. If you have not heard from us regarding the results in 2 weeks, please contact this office. °  ° ° ° °

## 2020-04-27 ENCOUNTER — Encounter: Payer: Self-pay | Admitting: Registered Nurse

## 2020-04-27 ENCOUNTER — Telehealth: Payer: Self-pay

## 2020-04-27 NOTE — Telephone Encounter (Signed)
Message was sent from [facility], Referral was sent to [facility] pt will be receiving a call soon.

## 2020-04-27 NOTE — Progress Notes (Signed)
Acute Office Visit  Subjective:    Patient ID: Bobby Mcguire, male    DOB: 02/09/1966, 54 y.o.   MRN: [id number]  Chief Complaint  Patient presents with  . Wrist Pain    patient states he is having some pain in his right wrist for about a week now states his thumb is not moving right. Also have back pain thinks he slept wrong.     HPI Patient is in today for r wrist pain Onset about a week ago, worsening Works with hands often Mostly on radial aspect of wrist Dull aching pain, limited ROM in thumb No radiation towards thumb or elbow No swelling No acute injury  Past Medical History:  Diagnosis Date  . Hypertension   . Prostate cancer ([place])   . Prostate cancer ([place])   . Sleep apnea     Past Surgical History:  Procedure Laterality Date  . PROSTATE BIOPSY      Family History  Problem Relation Age of Onset  . Colon cancer Mother   . Breast cancer Neg Hx   . Prostate cancer Neg Hx     Social History   Socioeconomic History  . Marital status: Married    Spouse name: Not on file  . Number of children: 4  . Years of education: Not on file  . Highest education level: Not on file  Occupational History  . Not on file  Tobacco Use  . Smoking status: Never Smoker  . Smokeless tobacco: Never Used  Substance and Sexual Activity  . Alcohol use: Yes    Alcohol/week: 3.0 standard drinks    Types: 3 Glasses of wine per week    Comment: reports he drinks a beer on occasion  . Drug use: No  . Sexual activity: Yes  Other Topics Concern  . Not on file  Social History Narrative   3 biological and 1 stepchild   Social Determinants of Health   Financial Resource Strain:   . Difficulty of Paying Living Expenses:   Food Insecurity:   . Worried About Charity fundraiser in the Last Year:   . Arboriculturist in the Last Year:   Transportation Needs:   . Film/video editor (Medical):   Marland Kitchen Lack of Transportation (Non-Medical):   Physical Activity:   . Days of  Exercise per Week:   . Minutes of Exercise per Session:   Stress:   . Feeling of Stress :   Social Connections:   . Frequency of Communication with Friends and Family:   . Frequency of Social Gatherings with Friends and Family:   . Attends Religious Services:   . Active Member of Clubs or Organizations:   . Attends Archivist Meetings:   Marland Kitchen Marital Status:   Intimate Partner Violence:   . Fear of Current or Ex-Partner:   . Emotionally Abused:   Marland Kitchen Physically Abused:   . Sexually Abused:     Outpatient Medications Prior to Visit  Medication Sig Dispense Refill  . fluticasone (FLONASE) 50 MCG/ACT nasal spray Place 2 sprays into both nostrils daily. (Patient taking differently: Place 2 sprays into both nostrils as needed. ) 16 g 6  . ibuprofen (ADVIL,MOTRIN) 800 MG tablet ibuprofen 800 mg tablet  TAKE 2 TABLETS TWICE A DAY    . losartan (COZAAR) 100 MG tablet TAKE 1 TABLET DAILY 90 tablet 1  . rosuvastatin (CRESTOR) 20 MG tablet TAKE 1 TABLET DAILY 90 tablet 3  . sildenafil (  VIAGRA) 100 MG tablet TAKE ONE-HALF (1/2) TO ONE TABLET DAILY AS NEEDED FOR ERECTILE DYSFUNCTION 30 tablet 5  . traZODone (DESYREL) 50 MG tablet Take 0.5-1 tablets (25-50 mg total) by mouth at bedtime as needed for sleep. 90 tablet 3  . amLODipine (NORVASC) 10 MG tablet Take 1 tablet (10 mg total) by mouth daily. 90 tablet 3   No facility-administered medications prior to visit.    Allergies  Allergen Reactions  . No Known Allergies     Review of Systems  Constitutional: Negative.   HENT: Negative.   Eyes: Negative.   Respiratory: Negative.   Cardiovascular: Negative.   Gastrointestinal: Negative.   Endocrine: Negative.   Genitourinary: Negative.   Musculoskeletal: Positive for arthralgias. Negative for back pain, gait problem, joint swelling, myalgias, neck pain and neck stiffness.  Skin: Negative.   Allergic/Immunologic: Negative.   Neurological: Negative.   Hematological: Negative.     Psychiatric/Behavioral: Negative.   All other systems reviewed and are negative.      Objective:    Physical Exam Vitals and nursing note reviewed.  Constitutional:      General: He is not in acute distress.    Appearance: Normal appearance. He is obese. He is not ill-appearing, toxic-appearing or diaphoretic.  Cardiovascular:     Rate and Rhythm: Normal rate and regular rhythm.  Pulmonary:     Effort: Pulmonary effort is normal. No respiratory distress.  Musculoskeletal:        General: Tenderness (radial aspect of wrist.) present. No swelling, deformity or signs of injury.     Cervical back: Normal range of motion and neck supple.     Comments: Limited ROM in right thumb  Skin:    General: Skin is warm and dry.     Capillary Refill: Capillary refill takes less than 2 seconds.     Coloration: Skin is not jaundiced or pale.     Findings: No bruising, erythema, lesion or rash.  Neurological:     General: No focal deficit present.     Mental Status: He is alert and oriented to person, place, and time. Mental status is at baseline.  Psychiatric:        Mood and Affect: Mood normal.        Behavior: Behavior normal.        Thought Content: Thought content normal.        Judgment: Judgment normal.     BP (!) 143/80   Pulse 69   Temp 97.8 F (36.6 C) (Temporal)   Resp 16   Ht 6\' 2"  (1.88 m)   Wt (!) 333 lb 3.2 oz (151.1 kg)   SpO2 97%   BMI 42.78 kg/m  Wt Readings from Last 3 Encounters:  04/22/20 (!) 333 lb 3.2 oz (151.1 kg)  04/08/20 (!) 334 lb 8 oz (151.7 kg)  02/19/20 (!) 323 lb 6.4 oz (146.7 kg)    There are no preventive care reminders to display for this patient.  There are no preventive care reminders to display for this patient.   Lab Results  Component Value Date   TSH 0.852 01/07/2020   Lab Results  Component Value Date   WBC 9.3 05/23/2019   HGB 14.2 05/23/2019   HCT 42.9 05/23/2019   MCV 99 (H) 05/23/2019   PLT 331 05/23/2019   Lab Results   Component Value Date   NA 142 01/07/2020   K 4.0 01/07/2020   CO2 22 01/07/2020   GLUCOSE 112 (H) 01/07/2020  BUN 12 01/07/2020   CREATININE 0.90 01/07/2020   BILITOT 0.5 02/19/2020   ALKPHOS 78 02/19/2020   AST 15 02/19/2020   ALT 28 02/19/2020   PROT 7.2 02/19/2020   ALBUMIN 4.6 02/19/2020   CALCIUM 10.4 (H) 01/07/2020   ANIONGAP 11 08/05/2018   GFR 89.54 01/07/2019   Lab Results  Component Value Date   CHOL 132 02/19/2020   Lab Results  Component Value Date   HDL 41 02/19/2020   Lab Results  Component Value Date   LDLCALC 77 02/19/2020   Lab Results  Component Value Date   TRIG 66 02/19/2020   Lab Results  Component Value Date   CHOLHDL 3.2 02/19/2020   Lab Results  Component Value Date   HGBA1C 5.0 11/05/2018       Assessment & Plan:   Problem List Items Addressed This Visit    None    Visit Diagnoses    Tendonitis of radial styloid    -  Primary   Relevant Medications   meloxicam (MOBIC) 15 MG tablet   methocarbamol (ROBAXIN) 500 MG tablet   Other Relevant Orders   Ambulatory referral to Hand Surgery       Meds ordered this encounter  Medications  . meloxicam (MOBIC) 15 MG tablet    Sig: Take 1 tablet (15 mg total) by mouth daily.    Dispense:  30 tablet    Refill:  0    Order Specific Question:   Supervising Provider    Answer:   Delia Chimes A [id number]  . methocarbamol (ROBAXIN) 500 MG tablet    Sig: Take 1 tablet (500 mg total) by mouth 4 (four) times daily.    Dispense:  60 tablet    Refill:  0    Order Specific Question:   Supervising Provider    Answer:   Forrest Moron [id number]   PLAN  Suspect overuse injury / tendonitis in thumb, but cannot rule out damage.  Will refer to hand surg  mobic and robaxin for relief short term  Patient encouraged to call clinic with any questions, comments, or concerns.  Maximiano Coss, NP

## 2020-04-27 NOTE — Telephone Encounter (Signed)
Sent message to pcp, to close the encounter note. Pt is having hand pain and has active referral for hand surgery

## 2020-04-29 ENCOUNTER — Ambulatory Visit (INDEPENDENT_AMBULATORY_CARE_PROVIDER_SITE_OTHER): Payer: BC Managed Care – PPO | Admitting: Orthopaedic Surgery

## 2020-04-29 ENCOUNTER — Ambulatory Visit: Payer: Self-pay

## 2020-04-29 ENCOUNTER — Other Ambulatory Visit: Payer: Self-pay

## 2020-04-29 ENCOUNTER — Encounter: Payer: Self-pay | Admitting: Orthopaedic Surgery

## 2020-04-29 VITALS — Ht 74.0 in | Wt 333.0 lb

## 2020-04-29 DIAGNOSIS — M25531 Pain in right wrist: Secondary | ICD-10-CM | POA: Insufficient documentation

## 2020-04-29 MED ORDER — LIDOCAINE HCL 1 % IJ SOLN
1.0000 mL | INTRAMUSCULAR | Status: AC | PRN
  Administered 2020-04-29: 1 mL

## 2020-04-29 MED ORDER — METHYLPREDNISOLONE ACETATE 40 MG/ML IJ SUSP
40.0000 mg | INTRAMUSCULAR | Status: AC | PRN
  Administered 2020-04-29: 11:00:00 40 mg via INTRA_ARTICULAR

## 2020-04-29 NOTE — Progress Notes (Signed)
Office Visit Note   Patient: Bobby Mcguire           Date of Birth: 1966/11/20           MRN: [id number] Visit Date: 04/29/2020              Requested by: Bobby Coss, NP [place],  [place] [id number] PCP: Bobby Coss, NP   Assessment & Plan: Visit Diagnoses:  1. Pain in right wrist     Plan: Films demonstrate degenerative changes at the radial carpal joint as well as the scapholunate joint right wrist.  I suspect that is the cause of his pain.  There is no evidence of de Quervain's or arthritis at the base of the thumb.  I injected the area of greatest tenderness with improvement in his pain and applied a volar wrist splint.  May continue with over-the-counter medicines or Voltaren gel.  Will return as needed.  Always could consider MRI scan  Follow-Up Instructions: Return if symptoms worsen or fail to improve.   Orders:  Orders Placed This Encounter  Procedures  . Medium Joint Inj: R radiocarpal  . XR Wrist 2 Views Right   No orders of the defined types were placed in this encounter.     Procedures: Medium Joint Inj: R radiocarpal on 04/29/2020 10:34 AM Details: 27 G needle, dorsal approach Medications: 1 mL lidocaine 1 %; 40 mg methylPREDNISolone acetate 40 MG/ML      Clinical Data: No additional findings.   Subjective: Chief Complaint  Patient presents with  . Right Wrist - Pain  Patient presents today for right wrist pain. He has been hurting for over a week now. No known injury. He has pain with turning his wrist to crank the car, or turn the key in a door. He has a swollen area on the posterior aspect of the distal radius. He saw his PCP and was told he had tendonitis. No numbness or tingling. He does state that his wrist feels "weak". His PCP gave him Meloxicam, but has not noticed any improvement. He is right hand dominant.  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   He is not had any numbness or tingling  HPI  Review of Systems   Objective: Vital Signs: Ht 6\' 2"  (1.88 m)   Wt (!) 333 lb (151 kg)   BMI 42.75 kg/m   Physical Exam Constitutional:      Appearance: He is well-developed.  Eyes:     Pupils: Pupils are equal, round, and reactive to light.  Pulmonary:     Effort: Pulmonary effort is normal.  Skin:    General: Skin is warm and dry.  Neurological:     Mental Status: He is alert and oriented to person, place, and time.  Psychiatric:        Behavior: Behavior normal.     Ortho Exam awake alert and oriented x3.  Comfortable sitting.  There is some swelling of the right wrist dorsally at the radiocarpal joint.  There is some local tenderness in that same location.  Very minimal discomfort in the snuffbox.  No grind test.  No Tinel's or Phalen's about the median nerve.  Finkelstein's test is negative for de Quervain's.  No swelling of the fingers.  Full range of motion.  No obvious dorsal edema of the wrist or hand neurologically intact  Specialty Comments:  No specialty comments  available.  Imaging: XR Wrist 2 Views Right  Result Date: 04/29/2020 Films of the right wrist were obtained in several projections.  The pain is localized at the radiocarpal joint.  There are degenerative changes at the scapholunate joint and even at the radial scaphoid joint.  No acute changes    PMFS History: Patient Active Problem List   Diagnosis Date Noted  . Pain in right wrist 04/29/2020  . Alcoholic peripheral neuropathy ([place]) 04/08/2020  . Malignant neoplasm of prostate ([place]) 08/06/2019  . Morbid (severe) obesity due to excess calories ([place]) 02/13/2019  . Osteoarthritis of knee 01/09/2018  . Erectile dysfunction 09/19/2017  . Essential hypertension 06/22/2017  . Severe obstructive sleep apnea-hypopnea syndrome 05/07/2010   Past Medical History:  Diagnosis Date  . Hypertension   . Prostate cancer ([place])   . Prostate cancer ([place])   .  Sleep apnea     Family History  Problem Relation Age of Onset  . Colon cancer Mother   . Breast cancer Neg Hx   . Prostate cancer Neg Hx     Past Surgical History:  Procedure Laterality Date  . PROSTATE BIOPSY     Social History   Occupational History  . Not on file  Tobacco Use  . Smoking status: Never Smoker  . Smokeless tobacco: Never Used  Substance and Sexual Activity  . Alcohol use: Yes    Alcohol/week: 3.0 standard drinks    Types: 3 Glasses of wine per week    Comment: reports he drinks a beer on occasion  . Drug use: No  . Sexual activity: Yes

## 2020-05-03 ENCOUNTER — Encounter: Payer: Self-pay | Admitting: Registered Nurse

## 2020-05-04 NOTE — Telephone Encounter (Signed)
Please Advise

## 2020-05-21 DIAGNOSIS — M67441 Ganglion, right hand: Secondary | ICD-10-CM | POA: Diagnosis not present

## 2020-05-21 DIAGNOSIS — R202 Paresthesia of skin: Secondary | ICD-10-CM | POA: Diagnosis not present

## 2020-05-21 DIAGNOSIS — M19039 Primary osteoarthritis, unspecified wrist: Secondary | ICD-10-CM | POA: Diagnosis not present

## 2020-05-21 DIAGNOSIS — M19031 Primary osteoarthritis, right wrist: Secondary | ICD-10-CM | POA: Diagnosis not present

## 2020-05-21 DIAGNOSIS — G5631 Lesion of radial nerve, right upper limb: Secondary | ICD-10-CM | POA: Diagnosis not present

## 2020-05-25 ENCOUNTER — Encounter: Payer: Self-pay | Admitting: Orthopaedic Surgery

## 2020-06-06 ENCOUNTER — Encounter: Payer: Self-pay | Admitting: Emergency Medicine

## 2020-06-08 ENCOUNTER — Other Ambulatory Visit: Payer: Self-pay | Admitting: Emergency Medicine

## 2020-06-08 DIAGNOSIS — N529 Male erectile dysfunction, unspecified: Secondary | ICD-10-CM

## 2020-06-08 MED ORDER — SILDENAFIL CITRATE 100 MG PO TABS: ORAL_TABLET | ORAL | 5 refills | Status: DC

## 2020-06-08 NOTE — Telephone Encounter (Signed)
We can refill this medication.  Thanks.

## 2020-06-24 ENCOUNTER — Encounter: Payer: Self-pay | Admitting: Registered Nurse

## 2020-06-24 ENCOUNTER — Other Ambulatory Visit: Payer: Self-pay

## 2020-06-24 ENCOUNTER — Ambulatory Visit (INDEPENDENT_AMBULATORY_CARE_PROVIDER_SITE_OTHER): Payer: BC Managed Care – PPO | Admitting: Registered Nurse

## 2020-06-24 VITALS — BP 128/69 | HR 75 | Temp 98.0°F | Resp 18 | Ht 74.0 in | Wt 346.6 lb

## 2020-06-24 DIAGNOSIS — R609 Edema, unspecified: Secondary | ICD-10-CM

## 2020-06-24 DIAGNOSIS — Z1159 Encounter for screening for other viral diseases: Secondary | ICD-10-CM | POA: Diagnosis not present

## 2020-06-24 DIAGNOSIS — Z23 Encounter for immunization: Secondary | ICD-10-CM | POA: Diagnosis not present

## 2020-06-24 DIAGNOSIS — Z1329 Encounter for screening for other suspected endocrine disorder: Secondary | ICD-10-CM

## 2020-06-24 DIAGNOSIS — Z13228 Encounter for screening for other metabolic disorders: Secondary | ICD-10-CM | POA: Diagnosis not present

## 2020-06-24 DIAGNOSIS — I10 Essential (primary) hypertension: Secondary | ICD-10-CM | POA: Diagnosis not present

## 2020-06-24 DIAGNOSIS — Z130 Encounter for screening for diseases of the blood and blood-forming organs and certain disorders involving the immune mechanism: Secondary | ICD-10-CM | POA: Diagnosis not present

## 2020-06-24 MED ORDER — HYDROCHLOROTHIAZIDE 25 MG PO TABS: 25.0000 mg | ORAL_TABLET | Freq: Every day | ORAL | 3 refills | Status: DC

## 2020-06-24 NOTE — Patient Instructions (Signed)
° ° ° °  If you have lab work done today you will be contacted with your lab results within the next 2 weeks.  If you have not heard from us then please contact us. The fastest way to get your results is to register for My Chart. ° ° °IF you received an x-ray today, you will receive an invoice from [facility]. Please contact [facility] at [phone] with questions or concerns regarding your invoice.  ° °IF you received labwork today, you will receive an invoice from [facility]. Please contact [facility] at [phone] with questions or concerns regarding your invoice.  ° °Our billing staff will not be able to assist you with questions regarding bills from these companies. ° °You will be contacted with the lab results as soon as they are available. The fastest way to get your results is to activate your My Chart account. Instructions are located on the last page of this paperwork. If you have not heard from us regarding the results in 2 weeks, please contact this office. °  ° ° ° °

## 2020-06-24 NOTE — Progress Notes (Signed)
Acute Office Visit  Subjective:    Patient ID: Bobby Mcguire, male    DOB: 04-09-1966, 54 y.o.   MRN: [id number]  Chief Complaint  Patient presents with  . Leg Swelling    Patient states he has had some leg swelling for a month he states that its some fluid built up.    HPI Patient is in today for leg swelling R<L Not painful Mildly itchy No rash, redness, or broken skin Has happened before and it was relieved with furosemide and weight loss. He has recently gained some weight No new shortness of breath, doe, palpitations, headaches, visual changes, or other CV symptoms No numbness, weakness, burning or tingling in feet.  Still interested in discussing his hx of prostate cancer, testosterone treatment, and using sildenafil with his urologist. We will send a message today to Dr. Army Fossa  Due for second Shingles shot - will give today. Reviewed side effects and what to expect.   Interested in completing HIV screen, Hep C screen, PSA, and Testosterone testing today.  Past Medical History:  Diagnosis Date  . Hypertension   . Prostate cancer ([place])   . Prostate cancer ([place])   . Sleep apnea     Past Surgical History:  Procedure Laterality Date  . PROSTATE BIOPSY      Family History  Problem Relation Age of Onset  . Colon cancer Mother   . Breast cancer Neg Hx   . Prostate cancer Neg Hx     Social History   Socioeconomic History  . Marital status: Married    Spouse name: Not on file  . Number of children: 4  . Years of education: Not on file  . Highest education level: Not on file  Occupational History  . Not on file  Tobacco Use  . Smoking status: Never Smoker  . Smokeless tobacco: Never Used  Vaping Use  . Vaping Use: Never used  Substance and Sexual Activity  . Alcohol use: Yes    Alcohol/week: 3.0 standard drinks    Types: 3 Glasses of wine per week    Comment: reports he drinks a beer on occasion  . Drug use: No  . Sexual activity: Yes  Other  Topics Concern  . Not on file  Social History Narrative   3 biological and 1 stepchild   Social Determinants of Health   Financial Resource Strain:   . Difficulty of Paying Living Expenses:   Food Insecurity:   . Worried About Charity fundraiser in the Last Year:   . Arboriculturist in the Last Year:   Transportation Needs:   . Film/video editor (Medical):   Marland Kitchen Lack of Transportation (Non-Medical):   Physical Activity:   . Days of Exercise per Week:   . Minutes of Exercise per Session:   Stress:   . Feeling of Stress :   Social Connections:   . Frequency of Communication with Friends and Family:   . Frequency of Social Gatherings with Friends and Family:   . Attends Religious Services:   . Active Member of Clubs or Organizations:   . Attends Archivist Meetings:   Marland Kitchen Marital Status:   Intimate Partner Violence:   . Fear of Current or Ex-Partner:   . Emotionally Abused:   Marland Kitchen Physically Abused:   . Sexually Abused:     Outpatient Medications Prior to Visit  Medication Sig Dispense Refill  . fluticasone (FLONASE) 50 MCG/ACT nasal spray Place 2  sprays into both nostrils daily. (Patient taking differently: Place 2 sprays into both nostrils as needed. ) 16 g 6  . ibuprofen (ADVIL,MOTRIN) 800 MG tablet ibuprofen 800 mg tablet  TAKE 2 TABLETS TWICE A DAY    . losartan (COZAAR) 100 MG tablet TAKE 1 TABLET DAILY 90 tablet 1  . rosuvastatin (CRESTOR) 20 MG tablet TAKE 1 TABLET DAILY 90 tablet 3  . sildenafil (VIAGRA) 100 MG tablet TAKE ONE-HALF (1/2) TO ONE TABLET DAILY AS NEEDED FOR ERECTILE DYSFUNCTION 30 tablet 5  . amLODipine (NORVASC) 10 MG tablet Take 1 tablet (10 mg total) by mouth daily. 90 tablet 3  . meloxicam (MOBIC) 15 MG tablet Take 1 tablet (15 mg total) by mouth daily. (Patient not taking: Reported on 06/24/2020) 30 tablet 0  . methocarbamol (ROBAXIN) 500 MG tablet Take 1 tablet (500 mg total) by mouth 4 (four) times daily. (Patient not taking: Reported on  06/24/2020) 60 tablet 0  . traZODone (DESYREL) 50 MG tablet Take 0.5-1 tablets (25-50 mg total) by mouth at bedtime as needed for sleep. (Patient not taking: Reported on 06/24/2020) 90 tablet 3   No facility-administered medications prior to visit.    Allergies  Allergen Reactions  . No Known Allergies     Review of Systems  Constitutional: Negative.   HENT: Negative.   Eyes: Negative.   Respiratory: Negative.   Cardiovascular: Positive for leg swelling. Negative for chest pain and palpitations.  Gastrointestinal: Negative.   Endocrine: Negative.   Genitourinary: Negative.   Musculoskeletal: Negative.   Skin: Negative.   Allergic/Immunologic: Negative.   Neurological: Negative.   Hematological: Negative.   Psychiatric/Behavioral: Negative.   All other systems reviewed and are negative.      Objective:    Physical Exam Vitals and nursing note reviewed.  Constitutional:      General: He is not in acute distress.    Appearance: Normal appearance. He is obese. He is not ill-appearing, toxic-appearing or diaphoretic.  Cardiovascular:     Rate and Rhythm: Normal rate and regular rhythm.     Pulses: Normal pulses.     Heart sounds: Normal heart sounds. No murmur heard.  No friction rub. No gallop.   Pulmonary:     Effort: Pulmonary effort is normal. No respiratory distress.  Musculoskeletal:        General: No swelling, tenderness, deformity or signs of injury. Normal range of motion.     Right lower leg: Edema (mild) present.     Left lower leg: Edema (2+ pitting) present.  Skin:    General: Skin is warm and dry.     Capillary Refill: Capillary refill takes less than 2 seconds.     Coloration: Skin is not jaundiced or pale.     Findings: No bruising, erythema, lesion or rash.  Neurological:     General: No focal deficit present.     Mental Status: He is alert and oriented to person, place, and time. Mental status is at baseline.  Psychiatric:        Mood and Affect:  Mood normal.        Behavior: Behavior normal.        Thought Content: Thought content normal.        Judgment: Judgment normal.     BP 128/69   Pulse 75   Temp 98 F (36.7 C) (Temporal)   Resp 18   Ht 6\' 2"  (1.88 m)   Wt (!) 346 lb 9.6 oz (157.2 kg)  SpO2 96%   BMI 44.50 kg/m  Wt Readings from Last 3 Encounters:  06/24/20 (!) 346 lb 9.6 oz (157.2 kg)  04/29/20 (!) 333 lb (151 kg)  04/22/20 (!) 333 lb 3.2 oz (151.1 kg)    Health Maintenance Due  Topic Date Due  . Hepatitis C Screening  Never done  . HIV Screening  Never done    There are no preventive care reminders to display for this patient.   Lab Results  Component Value Date   TSH 0.852 01/07/2020   Lab Results  Component Value Date   WBC 9.3 05/23/2019   HGB 14.2 05/23/2019   HCT 42.9 05/23/2019   MCV 99 (H) 05/23/2019   PLT 331 05/23/2019   Lab Results  Component Value Date   NA 142 01/07/2020   K 4.0 01/07/2020   CO2 22 01/07/2020   GLUCOSE 112 (H) 01/07/2020   BUN 12 01/07/2020   CREATININE 0.90 01/07/2020   BILITOT 0.5 02/19/2020   ALKPHOS 78 02/19/2020   AST 15 02/19/2020   ALT 28 02/19/2020   PROT 7.2 02/19/2020   ALBUMIN 4.6 02/19/2020   CALCIUM 10.4 (H) 01/07/2020   ANIONGAP 11 08/05/2018   GFR 89.54 01/07/2019   Lab Results  Component Value Date   CHOL 132 02/19/2020   Lab Results  Component Value Date   HDL 41 02/19/2020   Lab Results  Component Value Date   LDLCALC 77 02/19/2020   Lab Results  Component Value Date   TRIG 66 02/19/2020   Lab Results  Component Value Date   CHOLHDL 3.2 02/19/2020   Lab Results  Component Value Date   HGBA1C 5.0 11/05/2018       Assessment & Plan:   Problem List Items Addressed This Visit      Cardiovascular and Mediastinum   Essential hypertension   Relevant Medications   hydrochlorothiazide (HYDRODIURIL) 25 MG tablet    Other Visit Diagnoses    Screening for endocrine, metabolic and immunity disorder    -  Primary    Relevant Orders   PSA   Testosterone   Basic Metabolic Panel   Screening for viral disease       Relevant Orders   Hepatitis C antibody   HIV antibody (with reflex)   Need for shingles vaccine       Relevant Orders   Varicella-zoster vaccine subcutaneous   Dependent edema       Relevant Medications   hydrochlorothiazide (HYDRODIURIL) 25 MG tablet       Meds ordered this encounter  Medications  . hydrochlorothiazide (HYDRODIURIL) 25 MG tablet    Sig: Take 1 tablet (25 mg total) by mouth daily.    Dispense:  90 tablet    Refill:  3    Order Specific Question:   Supervising Provider    Answer:   [name] [id number]   PLAN  Fluid retention with a history of such. No other symptoms suggesting CHF. BP wnl today, has been well controlled, has good medication compliance.  Will refer to vascular for assessment and fitting of compression stockings  Will send message to urology to discuss testosterone with sildenafil and sexual activity  He will return in 3 mo  Shingles vaccine dose 2 given today  Patient encouraged to call clinic with any questions, comments, or concerns.  Maximiano Coss, NP

## 2020-06-25 LAB — BASIC METABOLIC PANEL
BUN/Creatinine Ratio: 13 (ref 9–20)
BUN: 10 mg/dL (ref 6–24)
CO2: 23 mmol/L (ref 20–29)
Calcium: 9.6 mg/dL (ref 8.7–10.2)
Chloride: 103 mmol/L (ref 96–106)
Creatinine, Ser: 0.76 mg/dL (ref 0.76–1.27)
GFR calc Af Amer: 120 mL/min/{1.73_m2} (ref >59)
GFR calc non Af Amer: 103 mL/min/{1.73_m2} (ref >59)
Glucose: 99 mg/dL (ref 65–99)
Potassium: 3.9 mmol/L (ref 3.5–5.2)
Sodium: 141 mmol/L (ref 134–144)

## 2020-06-25 LAB — PSA: Prostate Specific Ag, Serum: 0.4 ng/mL (ref 0.0–4.0)

## 2020-06-25 LAB — TESTOSTERONE: Testosterone: 174 ng/dL — ABNORMAL LOW (ref 264–916)

## 2020-06-25 LAB — HEPATITIS C ANTIBODY: Hep C Virus Ab: <0.1 s/co ratio (ref 0.0–0.9)

## 2020-06-25 LAB — HIV ANTIBODY (ROUTINE TESTING W REFLEX): HIV Screen 4th Generation wRfx: NONREACTIVE

## 2020-06-26 ENCOUNTER — Encounter: Payer: Self-pay | Admitting: Registered Nurse

## 2020-06-30 ENCOUNTER — Other Ambulatory Visit: Payer: Self-pay | Admitting: Emergency Medicine

## 2020-06-30 NOTE — Telephone Encounter (Signed)
Requested Prescriptions  Pending Prescriptions Disp Refills  . losartan (COZAAR) 100 MG tablet [Pharmacy Med Name: LOSARTAN TABS 100MG ] 90 tablet 1    Sig: TAKE 1 TABLET DAILY     Cardiovascular:  Angiotensin Receptor Blockers Passed - 06/30/2020 12:37 AM      Passed - Cr in normal range and within 180 days    Creat  Date Value Ref Range Status  08/18/2016 1.08 0.70 - 1.33 mg/dL Final    Comment:      For patients > or = 54 years of age: The upper reference limit for Creatinine is approximately 13% higher for people identified as African-American.      Creatinine, Ser  Date Value Ref Range Status  06/24/2020 0.76 0.76 - 1.27 mg/dL Final         Passed - K in normal range and within 180 days    Potassium  Date Value Ref Range Status  06/24/2020 3.9 3.5 - 5.2 mmol/L Final         Passed - Patient is not pregnant      Passed - Last BP in normal range    BP Readings from Last 1 Encounters:  06/24/20 128/69         Passed - Valid encounter within last 6 months    Recent Outpatient Visits          6 days ago Need for shingles vaccine   [facility], Delfino Lovett, NP   2 months ago Tendonitis of radial styloid   [facility], Delfino Lovett, NP   4 months ago Globus sensation   [facility], Delfino Lovett, NP   5 months ago Dysphagia, unspecified type   [facility], Delfino Lovett, NP   5 months ago Encounter for vitamin deficiency screening   [facility] at [facility], Ines Bloomer, MD      Future Appointments            In 2 months Maximiano Coss, NP [facility] [place], [facility]

## 2020-07-02 ENCOUNTER — Encounter: Payer: Self-pay | Admitting: Registered Nurse

## 2020-07-02 ENCOUNTER — Telehealth: Payer: Self-pay

## 2020-07-02 ENCOUNTER — Ambulatory Visit: Payer: BC Managed Care – PPO

## 2020-07-02 ENCOUNTER — Other Ambulatory Visit: Payer: Self-pay | Admitting: Registered Nurse

## 2020-07-02 ENCOUNTER — Other Ambulatory Visit: Payer: Self-pay

## 2020-07-02 DIAGNOSIS — R0981 Nasal congestion: Secondary | ICD-10-CM

## 2020-07-02 MED ORDER — OLOPATADINE HCL 0.6 % NA SOLN: 1.0000 | Freq: Two times a day (BID) | NASAL | 5 refills | Status: DC

## 2020-07-02 NOTE — Telephone Encounter (Signed)
Pt came in today thinking he was receiving his 2nd dose of shingrix not knowing that he'd already received the vaccine on 06/24/2020. Explained to pt the both vaccines have been received and  completed. Pt verbalized underestanding

## 2020-07-03 DIAGNOSIS — J069 Acute upper respiratory infection, unspecified: Secondary | ICD-10-CM | POA: Diagnosis not present

## 2020-07-03 DIAGNOSIS — Z1152 Encounter for screening for COVID-19: Secondary | ICD-10-CM | POA: Diagnosis not present

## 2020-07-06 ENCOUNTER — Other Ambulatory Visit: Payer: Self-pay | Admitting: Registered Nurse

## 2020-07-06 DIAGNOSIS — R059 Cough, unspecified: Secondary | ICD-10-CM

## 2020-07-06 MED ORDER — PREDNISONE 10 MG (21) PO TBPK: ORAL_TABLET | ORAL | 0 refills | Status: DC

## 2020-07-06 MED ORDER — BENZONATATE 200 MG PO CAPS: 200.0000 mg | ORAL_CAPSULE | Freq: Two times a day (BID) | ORAL | 0 refills | Status: DC | PRN

## 2020-07-06 NOTE — Telephone Encounter (Signed)
Patient states he is still experiencing issues with the cough that started on 06/30/2020. Per patient he went to get the Covid test and it was negative , and would like for something to be prescribed.  Please Advise.

## 2020-07-08 DIAGNOSIS — M5412 Radiculopathy, cervical region: Secondary | ICD-10-CM | POA: Diagnosis not present

## 2020-07-09 ENCOUNTER — Other Ambulatory Visit: Payer: Self-pay | Admitting: Emergency Medicine

## 2020-07-09 DIAGNOSIS — M5412 Radiculopathy, cervical region: Secondary | ICD-10-CM | POA: Diagnosis not present

## 2020-07-09 DIAGNOSIS — M19039 Primary osteoarthritis, unspecified wrist: Secondary | ICD-10-CM | POA: Diagnosis not present

## 2020-07-09 DIAGNOSIS — M19031 Primary osteoarthritis, right wrist: Secondary | ICD-10-CM | POA: Diagnosis not present

## 2020-07-09 DIAGNOSIS — I10 Essential (primary) hypertension: Secondary | ICD-10-CM

## 2020-07-16 DIAGNOSIS — G4733 Obstructive sleep apnea (adult) (pediatric): Secondary | ICD-10-CM | POA: Diagnosis not present

## 2020-08-05 ENCOUNTER — Encounter: Payer: Self-pay | Admitting: Registered Nurse

## 2020-08-05 ENCOUNTER — Telehealth: Payer: Self-pay

## 2020-08-05 DIAGNOSIS — R609 Edema, unspecified: Secondary | ICD-10-CM

## 2020-08-05 DIAGNOSIS — I10 Essential (primary) hypertension: Secondary | ICD-10-CM

## 2020-08-06 DIAGNOSIS — N5201 Erectile dysfunction due to arterial insufficiency: Secondary | ICD-10-CM | POA: Diagnosis not present

## 2020-08-06 DIAGNOSIS — C61 Malignant neoplasm of prostate: Secondary | ICD-10-CM | POA: Diagnosis not present

## 2020-08-14 ENCOUNTER — Other Ambulatory Visit: Payer: Self-pay | Admitting: *Deleted

## 2020-08-14 DIAGNOSIS — R609 Edema, unspecified: Secondary | ICD-10-CM

## 2020-08-14 MED ORDER — HYDROCHLOROTHIAZIDE 25 MG PO TABS: 25.0000 mg | ORAL_TABLET | Freq: Every day | ORAL | 3 refills | Status: DC

## 2020-08-14 NOTE — Telephone Encounter (Signed)
Patient medication was sent to the pharmacy 

## 2020-08-27 NOTE — Progress Notes (Signed)
[facility]           [facility] [place]  History and Physical   Bobby Mcguire is a 54 y.o. male who is referred for leg swelling.  He was seen by his PCP in July with c/o leg swelling with right more than left, not painful, no redness and no ulcers or wounds.  It was mildly itchy.  He had had leg swelling in the past was was relieved with weight loss and lasix.   He has hx of fluid retention with no other sx suggestive of CHF.  His labs were normal and pt compliant with medication.   He does have a hx of prostate cancer.    He presents today with c/o leg swelling.  He states that since his doctor placed him on a water pill, the swelling has gotten better.  He also likes to go to the gym and sit in the sauna and hot tub.  He states that after he sweats doing those things, the swelling is also better.  He states he has not worn the compression.  He states that his leg swelling was worse during the covid era of staying home and the gym being closed when he was doing more sitting than exercise.  He denies any claudication or non healing wounds.  He is wearing a knee brace on the left for arthritis of his knee.  He states he likes to drink some beer and have chicken wings.   He is unsure of his family hx as far as venous disorders or AAA.  The pt is on a statin for cholesterol management.  The pt is not on a daily aspirin.   Other AC:  none The pt is on CCB, ARB for hypertension.   The pt is not diabetic.   Tobacco hx:  never   Past Medical History:  Diagnosis Date  . Hypertension   . Prostate cancer ([place])   . Prostate cancer ([place])   . Sleep apnea     Past Surgical History:  Procedure Laterality Date  . PROSTATE BIOPSY      Social History   Socioeconomic History  . Marital status: Married    Spouse name: Not on file  . Number of children: 4  . Years of education: Not on file  . Highest education level: Not on file  Occupational History  . Not on file    Tobacco Use  . Smoking status: Never Smoker  . Smokeless tobacco: Never Used  Vaping Use  . Vaping Use: Never used  Substance and Sexual Activity  . Alcohol use: Yes    Alcohol/week: 3.0 standard drinks    Types: 3 Glasses of wine per week    Comment: reports he drinks a beer on occasion  . Drug use: No  . Sexual activity: Yes  Other Topics Concern  . Not on file  Social History Narrative   3 biological and 1 stepchild   Social Determinants of Health   Financial Resource Strain:   . Difficulty of Paying Living Expenses: Not on file  Food Insecurity:   . Worried About Charity fundraiser in the Last Year: Not on file  . Ran Out of Food in the Last Year: Not on file  Transportation Needs:   . Lack of Transportation (Medical): Not on file  . Lack of Transportation (Non-Medical): Not on file  Physical Activity:   . Days of Exercise  per Week: Not on file  . Minutes of Exercise per Session: Not on file  Stress:   . Feeling of Stress : Not on file  Social Connections:   . Frequency of Communication with Friends and Family: Not on file  . Frequency of Social Gatherings with Friends and Family: Not on file  . Attends Religious Services: Not on file  . Active Member of Clubs or Organizations: Not on file  . Attends Archivist Meetings: Not on file  . Marital Status: Not on file  Intimate Partner Violence:   . Fear of Current or Ex-Partner: Not on file  . Emotionally Abused: Not on file  . Physically Abused: Not on file  . Sexually Abused: Not on file     Family History  Problem Relation Age of Onset  . Colon cancer Mother   . Breast cancer Neg Hx   . Prostate cancer Neg Hx     Current Outpatient Medications  Medication Sig Dispense Refill  . amLODipine (NORVASC) 10 MG tablet TAKE 1 TABLET DAILY 90 tablet 1  . benzonatate (TESSALON) 200 MG capsule Take 1 capsule (200 mg total) by mouth 2 (two) times daily as needed for cough. 20 capsule 0  . fluticasone  (FLONASE) 50 MCG/ACT nasal spray Place 2 sprays into both nostrils daily. (Patient taking differently: Place 2 sprays into both nostrils as needed. ) 16 g 6  . hydrochlorothiazide (HYDRODIURIL) 25 MG tablet Take 1 tablet (25 mg total) by mouth daily. 90 tablet 3  . ibuprofen (ADVIL,MOTRIN) 800 MG tablet ibuprofen 800 mg tablet  TAKE 2 TABLETS TWICE A DAY    . losartan (COZAAR) 100 MG tablet TAKE 1 TABLET DAILY 90 tablet 1  . meloxicam (MOBIC) 15 MG tablet Take 1 tablet (15 mg total) by mouth daily. (Patient not taking: Reported on 06/24/2020) 30 tablet 0  . methocarbamol (ROBAXIN) 500 MG tablet Take 1 tablet (500 mg total) by mouth 4 (four) times daily. (Patient not taking: Reported on 06/24/2020) 60 tablet 0  . Olopatadine HCl 0.6 % SOLN Place 1 spray into the nose in the morning and at bedtime. 30.5 g 5  . predniSONE (STERAPRED UNI-PAK 21 TAB) 10 MG (21) TBPK tablet Take per package instructions. Do not skip doses. Finish entire supply. 1 each 0  . rosuvastatin (CRESTOR) 20 MG tablet TAKE 1 TABLET DAILY 90 tablet 3  . sildenafil (VIAGRA) 100 MG tablet TAKE ONE-HALF (1/2) TO ONE TABLET DAILY AS NEEDED FOR ERECTILE DYSFUNCTION 30 tablet 5  . traZODone (DESYREL) 50 MG tablet Take 0.5-1 tablets (25-50 mg total) by mouth at bedtime as needed for sleep. (Patient not taking: Reported on 06/24/2020) 90 tablet 3   No current facility-administered medications for this visit.    Allergies  Allergen Reactions  . No Known Allergies     REVIEW OF SYSTEMS:   [X]  denotes positive finding, [ ]  denotes negative finding Cardiac  Comments:  Chest pain or chest pressure:    Shortness of breath upon exertion:    Short of breath when lying flat:    Irregular heart rhythm:        Vascular    Pain in calf, thigh, or hip brought on by ambulation:    Pain in feet at night that wakes you up from your sleep:     Blood clot in your veins:    Leg swelling:  x       Pulmonary    Oxygen at home:  Productive  cough:     Wheezing:         Neurologic    Sudden weakness in arms or legs:     Sudden numbness in arms or legs:     Sudden onset of difficulty speaking or slurred speech:    Temporary loss of vision in one eye:     Problems with dizziness:         Gastrointestinal    Blood in stool:     Vomited blood:         Genitourinary    Burning when urinating:     Blood in urine:        Psychiatric    Major depression:         Hematologic    Bleeding problems:    Problems with blood clotting too easily:        Skin    Rashes or ulcers:        Constitutional    Fever or chills:      PHYSICAL EXAMINATION:  Today's Vitals   08/28/20 1339 08/28/20 1345  BP:  133/75  Pulse:  77  Resp:  20  Temp:  98.7 F (37.1 C)  TempSrc:  Temporal  SpO2:  98%  Height:  6\' 2"  (1.88 m)  PainSc: 0-No pain    Body mass index is 44.5 kg/m.   General:  WDWN in NAD; vital signs documented above Gait: Not observed HENT: WNL, normocephalic Pulmonary: normal non-labored breathing without wheezing Cardiac: regular HR; without carotid bruits Abdomen: soft, NT, no masses Skin: without rashes Vascular Exam/Pulses:  Right Left  Radial 2+ (normal) 2+ (normal)  Femoral Faintly palpable Unable to palpate due to body habitus  Popliteal Unable to palpate Unable to palpate  DP Monophasic doppler 2+ (normal) Brisk biphasic doppler  PT Monophasic doppler Brisk biphasic doppler   Extremities: without ischemic changes, without cellulitis; without open wounds; with mild skin color changes Musculoskeletal: no muscle wasting or atrophy  Neurologic: A&O X 3;  moving all extremities equally Psychiatric:  The pt has Normal affect.   Non-Invasive Vascular Imaging:   Venous duplex on 08/28/2020: Venous Reflux Times  +--------------+---------+------+-----------+------------+--------+  RIGHT     Reflux NoRefluxReflux TimeDiameter cmsComments               Yes                    +--------------+---------+------+-----------+------------+--------+  CFV      no                         +--------------+---------+------+-----------+------------+--------+  FV prox    no                         +--------------+---------+------+-----------+------------+--------+  FV mid    no                         +--------------+---------+------+-----------+------------+--------+  FV dist    no                         +--------------+---------+------+-----------+------------+--------+  Popliteal   no                         +--------------+---------+------+-----------+------------+--------+  GSV at SFJ  no               0.743        +--------------+---------+------+-----------+------------+--------+  GSV prox thighno               0.493        +--------------+---------+------+-----------+------------+--------+  GSV mid thigh no               0.399        +--------------+---------+------+-----------+------------+--------+  GSV dist thighno               0.356        +--------------+---------+------+-----------+------------+--------+  GSV at knee  no               0.408        +--------------+---------+------+-----------+------------+--------+  GSV prox calf                 0.329        +--------------+---------+------+-----------+------------+--------+  GSV mid calf                 0.214        +--------------+---------+------+-----------+------------+--------+  SSV Pop Fossa                 0.302        +--------------+---------+------+-----------+------------+--------+  SSV prox calf no                0.297        +--------------+---------+------+-----------+------------+--------+  SSV mid calf no               0.246        +--------------+---------+------+-----------+------------+--------+     +--------------+---------+------+-----------+------------+--------+  LEFT     Reflux NoRefluxReflux TimeDiameter cmsComments               Yes                   +--------------+---------+------+-----------+------------+--------+  CFV            yes  >1 second             +--------------+---------+------+-----------+------------+--------+  FV prox    no                         +--------------+---------+------+-----------+------------+--------+  FV mid    no                         +--------------+---------+------+-----------+------------+--------+  FV dist    no                         +--------------+---------+------+-----------+------------+--------+  Popliteal   no                         +--------------+---------+------+-----------+------------+--------+  GSV at SFJ  no               0.728        +--------------+---------+------+-----------+------------+--------+  GSV prox thighno               0.523        +--------------+---------+------+-----------+------------+--------+  GSV mid thigh no               0.655        +--------------+---------+------+-----------+------------+--------+  GSV dist thighno               0.470        +--------------+---------+------+-----------+------------+--------+  GSV at knee  no               0.425        +--------------+---------+------+-----------+------------+--------+  GSV prox calf                  0.443        +--------------+---------+------+-----------+------------+--------+  GSV mid calf                 0.466        +--------------+---------+------+-----------+------------+--------+  SSV Pop Fossa                 0.229        +--------------+---------+------+-----------+------------+--------+  SSV prox calf no               0.256        +--------------+---------+------+-----------+------------+--------+  SSV mid calf no               0.266        +--------------+---------+------+-----------+------------+--------+   Summary:  Bilateral:  - No evidence of deep vein thrombosis seen in the lower extremities,  bilaterally, from the common femoral through the popliteal veins.  - No evidence of superficial venous thrombosis in the lower extremities,  bilaterally.  - No evidence of superficial venous reflux seen in the greater saphenous veins bilaterally.  - No evidence of superficial venous reflux seen in the short saphenous  veins bilaterally.    Left:  - Venous reflux is noted in the left common femoral vein.    Bobby Mcguire is a 54 y.o. male who presents with: hx of bilateral leg swelling  He had increased BLE swelling during covid era of staying home and not being able to go to the gym.  He has started taking HCTZ and he states that his leg swelling has improved since starting this and going back to the gym.   -pt only has reflux in the left CFV and not a candidate for any laser ablation procedures.  I discussed with pt about wearing thigh high 20-86mmHg compression stockings and elevating legs.   Also discussed that water aerobics are very good for leg swelling -discussed importance of weight loss and diet and limiting salt intake.   -pt does have monophasic doppler signals right foot whereas the left DP pulse is palpable.  His right femoral  pulse is faintly palpable but difficult to palpate due to body habitus. I discussed with him should he develop any wounds or rest pain to come back to see [place], otherwise, he will f/u as needed.    Leontine Locket, [facility] 08/27/2020 9:10 AM  Clinic MD:  Donzetta Matters

## 2020-08-28 ENCOUNTER — Other Ambulatory Visit: Payer: Self-pay

## 2020-08-28 ENCOUNTER — Ambulatory Visit (HOSPITAL_COMMUNITY)
Admission: RE | Admit: 2020-08-28 | Discharge: 2020-08-28 | Disposition: A | Discharge status: Home / Self Care | Payer: BC Managed Care – PPO | Source: Ambulatory Visit | Attending: Vascular Surgery | Admitting: Vascular Surgery

## 2020-08-28 ENCOUNTER — Ambulatory Visit (INDEPENDENT_AMBULATORY_CARE_PROVIDER_SITE_OTHER): Payer: BC Managed Care – PPO | Admitting: Physician Assistant

## 2020-08-28 VITALS — BP 133/75 | HR 77 | Temp 98.7°F | Resp 20 | Ht 74.0 in | Wt 335.4 lb

## 2020-08-28 DIAGNOSIS — R609 Edema, unspecified: Secondary | ICD-10-CM | POA: Diagnosis not present

## 2020-08-28 DIAGNOSIS — M7989 Other specified soft tissue disorders: Secondary | ICD-10-CM | POA: Diagnosis not present

## 2020-09-23 ENCOUNTER — Ambulatory Visit (INDEPENDENT_AMBULATORY_CARE_PROVIDER_SITE_OTHER): Payer: BC Managed Care – PPO | Admitting: Registered Nurse

## 2020-09-23 ENCOUNTER — Encounter: Payer: Self-pay | Admitting: Registered Nurse

## 2020-09-23 ENCOUNTER — Other Ambulatory Visit: Payer: Self-pay

## 2020-09-23 VITALS — BP 144/75 | HR 75 | Temp 98.4°F | Resp 18 | Ht 74.0 in | Wt 340.0 lb

## 2020-09-23 DIAGNOSIS — M1711 Unilateral primary osteoarthritis, right knee: Secondary | ICD-10-CM | POA: Diagnosis not present

## 2020-09-23 DIAGNOSIS — G5631 Lesion of radial nerve, right upper limb: Secondary | ICD-10-CM | POA: Diagnosis not present

## 2020-09-23 DIAGNOSIS — Z23 Encounter for immunization: Secondary | ICD-10-CM

## 2020-09-23 DIAGNOSIS — Z1211 Encounter for screening for malignant neoplasm of colon: Secondary | ICD-10-CM | POA: Diagnosis not present

## 2020-09-23 NOTE — Patient Instructions (Signed)
° ° ° °  If you have lab work done today you will be contacted with your lab results within the next 2 weeks.  If you have not heard from us then please contact us. The fastest way to get your results is to register for My Chart. ° ° °IF you received an x-ray today, you will receive an invoice from [facility]. Please contact [facility] at [phone] with questions or concerns regarding your invoice.  ° °IF you received labwork today, you will receive an invoice from [facility]. Please contact [facility] at [phone] with questions or concerns regarding your invoice.  ° °Our billing staff will not be able to assist you with questions regarding bills from these companies. ° °You will be contacted with the lab results as soon as they are available. The fastest way to get your results is to activate your My Chart account. Instructions are located on the last page of this paperwork. If you have not heard from us regarding the results in 2 weeks, please contact this office. °  ° ° ° °

## 2020-09-23 NOTE — Progress Notes (Signed)
Established Patient Office Visit  Subjective:  Patient ID: Bobby Mcguire, male    DOB: 13-Dec-1966  Age: 54 y.o. MRN: [id number]  CC:  Chief Complaint  Patient presents with  . Follow-up    Patient is here for his follow on his knees and he wants to know if he could get a brace for the left knee.    HPI Bobby Mcguire presents for 3 mo follow up   HTN: improving. Mildly elevated today. Denies CV symptoms. Feeling well. Taking HCTZ regularly.  Dependent edema: greatly improved with HCTZ use. Followed with vascular, no issues noted at that time. No ongoing concerns  L Saturday night paralysis: ongoing. There was concern that there was some alcoholic peripheral neuropathy but patient has not had an alcoholic beverage in 6-8 weeks and symptoms persist. Notes that he worked with his hands a lot and now uses a Bobby Mcguire for work all day - would favor overuse / compression than alcoholic etiology, but patient requesting referral to neuro for further work up   bilat OA of knees: has brace on L knee. Works well. Thinking he will need a brace for R knee. No falls. Notes some instability. No worsening pain or swelling. No redness. Most recent xray on Dec 2020 noting tricompartmental OA with near bone on bone. Discussed that TKA is recommended by ortho but patient maintains function and will continue to wait until some further progression. Discussed weight loss for benefit of knee pain  No other concerns at this time. Feeling well overall.   Past Medical History:  Diagnosis Date  . Hypertension   . Prostate cancer ([place])   . Prostate cancer ([place])   . Sleep apnea     Past Surgical History:  Procedure Laterality Date  . PROSTATE BIOPSY      Family History  Problem Relation Age of Onset  . Colon cancer Mother   . Breast cancer Neg Hx   . Prostate cancer Neg Hx     Social History   Socioeconomic History  . Marital status: Married    Spouse name: Not on file  . Number of children:  4  . Years of education: Not on file  . Highest education level: Not on file  Occupational History  . Not on file  Tobacco Use  . Smoking status: Never Smoker  . Smokeless tobacco: Never Used  Vaping Use  . Vaping Use: Never used  Substance and Sexual Activity  . Alcohol use: Yes    Alcohol/week: 3.0 standard drinks    Types: 3 Glasses of wine per week    Comment: reports he drinks a beer on occasion  . Drug use: No  . Sexual activity: Yes  Other Topics Concern  . Not on file  Social History Narrative   3 biological and 1 stepchild   Social Determinants of Health   Financial Resource Strain:   . Difficulty of Paying Living Expenses: Not on file  Food Insecurity:   . Worried About Charity fundraiser in the Last Year: Not on file  . Ran Out of Food in the Last Year: Not on file  Transportation Needs:   . Lack of Transportation (Medical): Not on file  . Lack of Transportation (Non-Medical): Not on file  Physical Activity:   . Days of Exercise per Week: Not on file  . Minutes of Exercise per Session: Not on file  Stress:   . Feeling of Stress : Not on file  Social  Connections:   . Frequency of Communication with Friends and Family: Not on file  . Frequency of Social Gatherings with Friends and Family: Not on file  . Attends Religious Services: Not on file  . Active Member of Clubs or Organizations: Not on file  . Attends Archivist Meetings: Not on file  . Marital Status: Not on file  Intimate Partner Violence:   . Fear of Current or Ex-Partner: Not on file  . Emotionally Abused: Not on file  . Physically Abused: Not on file  . Sexually Abused: Not on file    Outpatient Medications Prior to Visit  Medication Sig Dispense Refill  . amLODipine (NORVASC) 10 MG tablet TAKE 1 TABLET DAILY 90 tablet 1  . losartan (COZAAR) 100 MG tablet TAKE 1 TABLET DAILY 90 tablet 1  . Olopatadine HCl 0.6 % SOLN Place 1 spray into the nose in the morning and at bedtime. 30.5 g  5  . rosuvastatin (CRESTOR) 20 MG tablet TAKE 1 TABLET DAILY 90 tablet 3  . sildenafil (VIAGRA) 100 MG tablet TAKE ONE-HALF (1/2) TO ONE TABLET DAILY AS NEEDED FOR ERECTILE DYSFUNCTION 30 tablet 5  . benzonatate (TESSALON) 200 MG capsule Take 1 capsule (200 mg total) by mouth 2 (two) times daily as needed for cough. 20 capsule 0  . fluticasone (FLONASE) 50 MCG/ACT nasal spray Place 2 sprays into both nostrils daily. (Patient taking differently: Place 2 sprays into both nostrils as needed. ) 16 g 6  . hydrochlorothiazide (HYDRODIURIL) 25 MG tablet Take 1 tablet (25 mg total) by mouth daily. 90 tablet 3  . ibuprofen (ADVIL,MOTRIN) 800 MG tablet ibuprofen 800 mg tablet  TAKE 2 TABLETS TWICE A DAY    . meloxicam (MOBIC) 15 MG tablet Take 1 tablet (15 mg total) by mouth daily. (Patient not taking: Reported on 06/24/2020) 30 tablet 0  . methocarbamol (ROBAXIN) 500 MG tablet Take 1 tablet (500 mg total) by mouth 4 (four) times daily. (Patient not taking: Reported on 06/24/2020) 60 tablet 0  . predniSONE (STERAPRED UNI-PAK 21 TAB) 10 MG (21) TBPK tablet Take per package instructions. Do not skip doses. Finish entire supply. 1 each 0  . traZODone (DESYREL) 50 MG tablet Take 0.5-1 tablets (25-50 mg total) by mouth at bedtime as needed for sleep. 90 tablet 3   No facility-administered medications prior to visit.    Allergies  Allergen Reactions  . No Known Allergies     ROS Review of Systems  Constitutional: Negative.   HENT: Negative.   Eyes: Negative.   Respiratory: Negative.   Cardiovascular: Negative.   Gastrointestinal: Negative.   Genitourinary: Negative.   Musculoskeletal: Positive for arthralgias. Negative for back pain, gait problem, joint swelling, myalgias, neck pain and neck stiffness.  Skin: Negative.   Neurological: Positive for weakness. Negative for tremors, numbness and headaches.  Psychiatric/Behavioral: Negative.       Objective:    Physical Exam Constitutional:       General: He is not in acute distress.    Appearance: Normal appearance. He is normal weight. He is not ill-appearing, toxic-appearing or diaphoretic.  Cardiovascular:     Rate and Rhythm: Normal rate and regular rhythm.     Heart sounds: Normal heart sounds. No murmur heard.  No friction rub. No gallop.   Pulmonary:     Effort: Pulmonary effort is normal. No respiratory distress.     Breath sounds: Normal breath sounds. No stridor. No wheezing, rhonchi or rales.  Chest:  Chest wall: No tenderness.  Musculoskeletal:        General: No tenderness or signs of injury.     Right lower leg: No edema.     Left lower leg: No edema.  Skin:    General: Skin is warm and dry.     Capillary Refill: Capillary refill takes less than 2 seconds.  Neurological:     General: No focal deficit present.     Mental Status: He is alert and oriented to person, place, and time. Mental status is at baseline.     Motor: Weakness (L hand 5th digit suspect ulnar involvement) present.  Psychiatric:        Mood and Affect: Mood normal.        Behavior: Behavior normal.        Thought Content: Thought content normal.        Judgment: Judgment normal.     BP (!) 144/75   Pulse 75   Temp 98.4 F (36.9 C) (Temporal)   Resp 18   Ht 6\' 2"  (1.88 m)   Wt (!) 340 lb (154.2 kg)   SpO2 96%   BMI 43.65 kg/m  Wt Readings from Last 3 Encounters:  09/23/20 (!) 340 lb (154.2 kg)  08/28/20 (!) 335 lb 6.4 oz (152.1 kg)  06/24/20 (!) 346 lb 9.6 oz (157.2 kg)     Health Maintenance Due  Topic Date Due  . COLONOSCOPY  07/19/2020    There are no preventive care reminders to display for this patient.  Lab Results  Component Value Date   TSH 0.852 01/07/2020   Lab Results  Component Value Date   WBC 9.3 05/23/2019   HGB 14.2 05/23/2019   HCT 42.9 05/23/2019   MCV 99 (H) 05/23/2019   PLT 331 05/23/2019   Lab Results  Component Value Date   NA 141 06/24/2020   K 3.9 06/24/2020   CO2 23 06/24/2020    GLUCOSE 99 06/24/2020   BUN 10 06/24/2020   CREATININE 0.76 06/24/2020   BILITOT 0.5 02/19/2020   ALKPHOS 78 02/19/2020   AST 15 02/19/2020   ALT 28 02/19/2020   PROT 7.2 02/19/2020   ALBUMIN 4.6 02/19/2020   CALCIUM 9.6 06/24/2020   ANIONGAP 11 08/05/2018   GFR 89.54 01/07/2019   Lab Results  Component Value Date   CHOL 132 02/19/2020   Lab Results  Component Value Date   HDL 41 02/19/2020   Lab Results  Component Value Date   LDLCALC 77 02/19/2020   Lab Results  Component Value Date   TRIG 66 02/19/2020   Lab Results  Component Value Date   CHOLHDL 3.2 02/19/2020   Lab Results  Component Value Date   HGBA1C 5.0 11/05/2018      Assessment & Plan:   Problem List Items Addressed This Visit      Nervous and Auditory   Saturday night paralysis of right upper extremity   Relevant Orders   Ambulatory referral to Neurosurgery     Musculoskeletal and Integument   Osteoarthritis of knee - Primary   Relevant Orders   For home use only DME Other see comment    Other Visit Diagnoses    Flu vaccine need       Relevant Orders   Flu Vaccine QUAD 6+ mos PF IM (Fluarix Quad PF) (Completed)   Special screening for malignant neoplasms, colon       Relevant Orders   Ambulatory referral to Gastroenterology  No orders of the defined types were placed in this encounter.   Follow-up: No follow-ups on file.   PLAN  Refer to neurosurg for further workup of RUE paralysis  DME order written for R knee brace  Reviewed labs  Referred for colonoscopy  Flu vaccine given  Follow up in 6 mo  Patient encouraged to call clinic with any questions, comments, or concerns.  Maximiano Coss, NP

## 2020-10-05 DIAGNOSIS — R29898 Other symptoms and signs involving the musculoskeletal system: Secondary | ICD-10-CM | POA: Diagnosis not present

## 2020-10-05 DIAGNOSIS — I10 Essential (primary) hypertension: Secondary | ICD-10-CM | POA: Diagnosis not present

## 2020-10-05 DIAGNOSIS — M542 Cervicalgia: Secondary | ICD-10-CM | POA: Diagnosis not present

## 2020-10-05 DIAGNOSIS — M5410 Radiculopathy, site unspecified: Secondary | ICD-10-CM | POA: Diagnosis not present

## 2020-10-15 DIAGNOSIS — G4733 Obstructive sleep apnea (adult) (pediatric): Secondary | ICD-10-CM | POA: Diagnosis not present

## 2020-10-23 ENCOUNTER — Other Ambulatory Visit: Payer: Self-pay | Admitting: Registered Nurse

## 2020-10-23 DIAGNOSIS — J988 Other specified respiratory disorders: Secondary | ICD-10-CM

## 2020-10-23 DIAGNOSIS — J019 Acute sinusitis, unspecified: Secondary | ICD-10-CM

## 2020-10-29 DIAGNOSIS — G5621 Lesion of ulnar nerve, right upper limb: Secondary | ICD-10-CM | POA: Diagnosis not present

## 2020-10-29 DIAGNOSIS — M5412 Radiculopathy, cervical region: Secondary | ICD-10-CM | POA: Diagnosis not present

## 2020-10-29 DIAGNOSIS — G5620 Lesion of ulnar nerve, unspecified upper limb: Secondary | ICD-10-CM | POA: Insufficient documentation

## 2020-10-30 ENCOUNTER — Encounter: Payer: Self-pay | Admitting: Orthopaedic Surgery

## 2020-10-30 ENCOUNTER — Ambulatory Visit (INDEPENDENT_AMBULATORY_CARE_PROVIDER_SITE_OTHER): Payer: BC Managed Care – PPO | Admitting: Orthopaedic Surgery

## 2020-10-30 DIAGNOSIS — M170 Bilateral primary osteoarthritis of knee: Secondary | ICD-10-CM | POA: Diagnosis not present

## 2020-10-30 MED ORDER — BUPIVACAINE HCL 0.5 % IJ SOLN
2.0000 mL | INTRAMUSCULAR | Status: AC | PRN
  Administered 2020-10-30: 2 mL via INTRA_ARTICULAR

## 2020-10-30 MED ORDER — METHYLPREDNISOLONE ACETATE 40 MG/ML IJ SUSP
80.0000 mg | INTRAMUSCULAR | Status: AC | PRN
  Administered 2020-10-30: 80 mg via INTRA_ARTICULAR

## 2020-10-30 MED ORDER — LIDOCAINE HCL 1 % IJ SOLN
2.0000 mL | INTRAMUSCULAR | Status: AC | PRN
  Administered 2020-10-30: 2 mL

## 2020-10-30 NOTE — Progress Notes (Signed)
Office Visit Note   Patient: Bobby Mcguire           Date of Birth: [birth year]           MRN: [id number] Visit Date: 10/30/2020              Requested by: Maximiano Coss, NP [place],  [place] [id number] PCP: Maximiano Coss, NP   Assessment & Plan: Visit Diagnoses:  1. Primary osteoarthritis of both knees     Plan: Mr. Bobby Mcguire is experiencing recurrent symptoms of osteoarthritis of both knees. He was last seen about a year ago for cortisone injections. Prior films demonstrated advanced degenerative changes of both knees. He like to have repeat cortisone injections bilaterally  Follow-Up Instructions: Return if symptoms worsen or fail to improve.   Orders:  No orders of the defined types were placed in this encounter.  No orders of the defined types were placed in this encounter.     Procedures: Large Joint Inj: bilateral knee on 10/30/2020 11:25 AM Indications: diagnostic evaluation Details: 25 G 1.5 in needle, anteromedial approach  Arthrogram: No  Medications (Right): 2 mL lidocaine 1 %; 2 mL bupivacaine 0.5 %; 80 mg methylPREDNISolone acetate 40 MG/ML Medications (Left): 2 mL lidocaine 1 %; 2 mL bupivacaine 0.5 %; 80 mg methylPREDNISolone acetate 40 MG/ML Consent was given by the patient. Immediately prior to procedure a time out was called to verify the correct patient, procedure, equipment, support staff and site/side marked as required. Patient was prepped and draped in the usual sterile fashion.       Clinical Data: No additional findings.   Subjective: Chief Complaint  Patient presents with  . Left Knee - Pain  . Right Knee - Pain  Has an established diagnosis of advanced osteoarthritis of both knees by virtue of prior films about a year ago. Has done well with cortisone in the past. Wants to have repeat cortisone injections of both knees today  HPI  Review of Systems   Objective: Vital Signs: There were no vitals taken for this  visit.  Physical Exam Constitutional:      Appearance: He is well-developed.  Eyes:     Pupils: Pupils are equal, round, and reactive to light.  Pulmonary:     Effort: Pulmonary effort is normal.  Skin:    General: Skin is warm and dry.  Neurological:     Mental Status: He is alert and oriented to person, place, and time.  Psychiatric:        Behavior: Behavior normal.     Ortho Exam awake alert and oriented x3. Comfortable sitting. Full extension both knees flexed probably 100 degrees without instability small effusions. Mostly medial joint pain. Some patella crepitation. Walks without a limp  Specialty Comments:  No specialty comments available.  Imaging: No results found.   PMFS History: Patient Active Problem List   Diagnosis Date Noted  . Cervical radiculopathy at C8 07/09/2020  . Ganglion of flexor tendon sheath of right thumb 05/21/2020  . Saturday night paralysis of right upper extremity 05/21/2020  . Wrist arthritis 05/21/2020  . Pain in right wrist 04/29/2020  . Alcoholic peripheral neuropathy ([place]) 04/08/2020  . Laryngopharyngeal reflux (LPR) 01/14/2020  . Pharyngoesophageal dysphagia 01/14/2020  . Malignant neoplasm of prostate ([place]) 08/06/2019  . Morbid (severe) obesity due to excess calories ([place]) 02/13/2019  . Osteoarthritis of knee 01/09/2018  . Erectile dysfunction 09/19/2017  . Essential hypertension 06/22/2017  . Severe obstructive sleep apnea-hypopnea syndrome  05/07/2010   Past Medical History:  Diagnosis Date  . Hypertension   . Prostate cancer ([place])   . Prostate cancer ([place])   . Sleep apnea     Family History  Problem Relation Age of Onset  . Colon cancer Mother   . Breast cancer Neg Hx   . Prostate cancer Neg Hx     Past Surgical History:  Procedure Laterality Date  . PROSTATE BIOPSY     Social History   Occupational History  . Not on file  Tobacco Use  . Smoking status: Never Smoker  . Smokeless tobacco: Never Used  Vaping  Use  . Vaping Use: Never used  Substance and Sexual Activity  . Alcohol use: Yes    Alcohol/week: 3.0 standard drinks    Types: 3 Glasses of wine per week    Comment: reports he drinks a beer on occasion  . Drug use: No  . Sexual activity: Yes     Garald Balding, MD   Note - This record has been created using [employer].  Chart creation errors have been sought, but may not always  have been located. Such creation errors do not reflect on  the standard of medical care.

## 2020-11-16 ENCOUNTER — Encounter: Payer: Self-pay | Admitting: Registered Nurse

## 2020-11-16 NOTE — Telephone Encounter (Signed)
How much Zinc is too much?  Pt questioning if taking Multi Vit as well as Zinc supplement is too much can you advise?  My own thinking: I would think this would be okay given levels in multi vits are low? But obviously would like confirmation so I do not misinform thank you

## 2020-11-19 ENCOUNTER — Other Ambulatory Visit: Payer: Self-pay | Admitting: Registered Nurse

## 2020-11-19 ENCOUNTER — Encounter: Payer: Self-pay | Admitting: Emergency Medicine

## 2020-11-19 ENCOUNTER — Telehealth: Payer: Self-pay | Admitting: Registered Nurse

## 2020-11-19 ENCOUNTER — Telehealth (INDEPENDENT_AMBULATORY_CARE_PROVIDER_SITE_OTHER): Payer: BC Managed Care – PPO | Admitting: Emergency Medicine

## 2020-11-19 ENCOUNTER — Other Ambulatory Visit: Payer: Self-pay

## 2020-11-19 VITALS — Ht 75.0 in | Wt 340.0 lb

## 2020-11-19 DIAGNOSIS — G4733 Obstructive sleep apnea (adult) (pediatric): Secondary | ICD-10-CM

## 2020-11-19 DIAGNOSIS — R0981 Nasal congestion: Secondary | ICD-10-CM

## 2020-11-19 DIAGNOSIS — Z9989 Dependence on other enabling machines and devices: Secondary | ICD-10-CM | POA: Diagnosis not present

## 2020-11-19 DIAGNOSIS — R6889 Other general symptoms and signs: Secondary | ICD-10-CM

## 2020-11-19 DIAGNOSIS — J019 Acute sinusitis, unspecified: Secondary | ICD-10-CM

## 2020-11-19 DIAGNOSIS — J988 Other specified respiratory disorders: Secondary | ICD-10-CM

## 2020-11-19 MED ORDER — PSEUDOEPHEDRINE-GUAIFENESIN ER 60-600 MG PO TB12: 1.0000 | ORAL_TABLET | Freq: Two times a day (BID) | ORAL | 1 refills | Status: AC

## 2020-11-19 MED ORDER — PREDNISONE 20 MG PO TABS: 40.0000 mg | ORAL_TABLET | Freq: Every day | ORAL | 3 refills | Status: AC

## 2020-11-19 NOTE — Telephone Encounter (Signed)
Pt called and stated after his virtual appt with provider Dr. Mitchel Honour he is needing a note for his work with the date being out from 12/1 and will return Monday 12/6. Pt would like this sent to him threw MyChart if possible. Please advise.

## 2020-11-19 NOTE — Progress Notes (Addendum)
Telemedicine Encounter- SOAP NOTE Established Patient Patient: Home  Provider: Office     This telephone encounter was conducted with the patient's (or proxy's) verbal consent via audio telecommunications: yes/no: Yes Patient was instructed to have this encounter in a suitably private space; and to only have persons present to whom they give permission to participate. In addition, patient identity was confirmed by use of name plus two identifiers (DOB and address).  I discussed the limitations, risks, security and privacy concerns of performing an evaluation and management service by telephone and the availability of in person appointments. I also discussed with the patient that there may be a patient responsible charge related to this service. The patient expressed understanding and agreed to proceed.  I spent a total of TIME; 0 MIN TO 60 MIN: 20 minutes talking with the patient or their proxy.  Chief Complaint  Patient presents with  . Cough    started last Friday nonproductive, per patient when he coughs the right side of his head hurts  . Nasal Congestion    and sneezing, patient has not been tested for Covid 19; vaccinated for Covid plus the booster  . Sore Throat    per patient from coughing    Subjective   Bobby Mcguire is a 54 y.o. male established patient. Telephone visit today complaining of flulike symptoms that started several days ago.  Slowly getting better but still has some residual nasal congestion making it hard to breathe with CPAP machine at night.  Denies any other significant symptoms.  HPI   Patient Active Problem List   Diagnosis Date Noted  . Cervical radiculopathy at C8 07/09/2020  . Ganglion of flexor tendon sheath of right thumb 05/21/2020  . Saturday night paralysis of right upper extremity 05/21/2020  . Wrist arthritis 05/21/2020  . Pain in right wrist 04/29/2020  . Alcoholic peripheral neuropathy ([place]) 04/08/2020  . Laryngopharyngeal reflux  (LPR) 01/14/2020  . Pharyngoesophageal dysphagia 01/14/2020  . Malignant neoplasm of prostate ([place]) 08/06/2019  . Morbid (severe) obesity due to excess calories ([place]) 02/13/2019  . Osteoarthritis of knee 01/09/2018  . Erectile dysfunction 09/19/2017  . Essential hypertension 06/22/2017  . Severe obstructive sleep apnea-hypopnea syndrome 05/07/2010    Past Medical History:  Diagnosis Date  . Hypertension   . Prostate cancer ([place])   . Prostate cancer ([place])   . Sleep apnea     Current Outpatient Medications  Medication Sig Dispense Refill  . amLODipine (NORVASC) 10 MG tablet TAKE 1 TABLET DAILY 90 tablet 1  . losartan (COZAAR) 100 MG tablet TAKE 1 TABLET DAILY 90 tablet 1  . MULTIPLE VITAMINS PO Take by mouth daily.    . rosuvastatin (CRESTOR) 20 MG tablet TAKE 1 TABLET DAILY 90 tablet 3  . sildenafil (VIAGRA) 100 MG tablet TAKE ONE-HALF (1/2) TO ONE TABLET DAILY AS NEEDED FOR ERECTILE DYSFUNCTION 30 tablet 5  . Zinc 50 MG TABS Take by mouth daily.    . benzonatate (TESSALON) 200 MG capsule Take 1 capsule (200 mg total) by mouth 2 (two) times daily as needed for cough. 20 capsule 0  . fluticasone (FLONASE) 50 MCG/ACT nasal spray USE 2 SPRAYS IN EACH NOSTRIL DAILY 16 g 2  . hydrochlorothiazide (HYDRODIURIL) 25 MG tablet Take 1 tablet (25 mg total) by mouth daily. 90 tablet 3  . ibuprofen (ADVIL,MOTRIN) 800 MG tablet ibuprofen 800 mg tablet  TAKE 2 TABLETS TWICE A DAY    . meloxicam (MOBIC) 15 MG tablet Take  1 tablet (15 mg total) by mouth daily. (Patient not taking: Reported on 06/24/2020) 30 tablet 0  . methocarbamol (ROBAXIN) 500 MG tablet Take 1 tablet (500 mg total) by mouth 4 (four) times daily. (Patient not taking: Reported on 06/24/2020) 60 tablet 0  . Olopatadine HCl 0.6 % SOLN Place 1 spray into the nose in the morning and at bedtime. (Patient not taking: Reported on 11/19/2020) 30.5 g 5  . predniSONE (STERAPRED UNI-PAK 21 TAB) 10 MG (21) TBPK tablet Take per package instructions.  Do not skip doses. Finish entire supply. 1 each 0  . traZODone (DESYREL) 50 MG tablet Take 0.5-1 tablets (25-50 mg total) by mouth at bedtime as needed for sleep. 90 tablet 3   No current facility-administered medications for this visit.    Allergies  Allergen Reactions  . No Known Allergies     Social History   Socioeconomic History  . Marital status: Married    Spouse name: Not on file  . Number of children: 4  . Years of education: Not on file  . Highest education level: Not on file  Occupational History  . Not on file  Tobacco Use  . Smoking status: Never Smoker  . Smokeless tobacco: Never Used  Vaping Use  . Vaping Use: Never used  Substance and Sexual Activity  . Alcohol use: Yes    Alcohol/week: 3.0 standard drinks    Types: 3 Glasses of wine per week    Comment: reports he drinks a beer on occasion  . Drug use: No  . Sexual activity: Yes  Other Topics Concern  . Not on file  Social History Narrative   3 biological and 1 stepchild   Social Determinants of Health   Financial Resource Strain:   . Difficulty of Paying Living Expenses: Not on file  Food Insecurity:   . Worried About Charity fundraiser in the Last Year: Not on file  . Ran Out of Food in the Last Year: Not on file  Transportation Needs:   . Lack of Transportation (Medical): Not on file  . Lack of Transportation (Non-Medical): Not on file  Physical Activity:   . Days of Exercise per Week: Not on file  . Minutes of Exercise per Session: Not on file  Stress:   . Feeling of Stress : Not on file  Social Connections:   . Frequency of Communication with Friends and Family: Not on file  . Frequency of Social Gatherings with Friends and Family: Not on file  . Attends Religious Services: Not on file  . Active Member of Clubs or Organizations: Not on file  . Attends Archivist Meetings: Not on file  . Marital Status: Not on file  Intimate Partner Violence:   . Fear of Current or  Ex-Partner: Not on file  . Emotionally Abused: Not on file  . Physically Abused: Not on file  . Sexually Abused: Not on file    Review of Systems  Constitutional: Negative.  Negative for chills and fever.  HENT: Positive for congestion and sore throat.   Respiratory: Positive for cough. Negative for sputum production, shortness of breath and wheezing.   Cardiovascular: Negative.  Negative for chest pain and palpitations.  Gastrointestinal: Negative.  Negative for abdominal pain, diarrhea, nausea and vomiting.  Genitourinary: Negative for dysuria and hematuria.  Musculoskeletal: Negative for myalgias.  Skin: Negative.  Negative for rash.  Neurological: Negative.  Negative for dizziness and headaches.  All other systems reviewed and are  negative.   Objective  Alert and oriented x3 in no apparent respiratory distress Vitals as reported by the patient: Today's Vitals   11/19/20 1329  Weight: (!) 340 lb (154.2 kg)  Height: 6\' 3"  (1.905 m)    There are no diagnoses linked to this encounter. Rodrick was seen today for cough, nasal congestion and sore throat.  Diagnoses and all orders for this visit:  Nasal congestion  Sinus congestion -     pseudoephedrine-guaifenesin (MUCINEX D) 60-600 MG 12 hr tablet; Take 1 tablet by mouth every 12 (twelve) hours for 5 days. -     predniSONE (DELTASONE) 20 MG tablet; Take 2 tablets (40 mg total) by mouth daily with breakfast for 5 days. Then take as needed.  Flu-like symptoms  OSA on CPAP     I discussed the assessment and treatment plan with the patient. The patient was provided an opportunity to ask questions and all were answered. The patient agreed with the plan and demonstrated an understanding of the instructions.   The patient was advised to call back or seek an in-person evaluation if the symptoms worsen or if the condition fails to improve as anticipated.  I provided 20 minutes of non-face-to-face time during this  encounter.  [name], MD  Primary Care at [facility]

## 2020-11-20 NOTE — Telephone Encounter (Signed)
Completed and sent Via MyChart

## 2020-11-25 ENCOUNTER — Ambulatory Visit (INDEPENDENT_AMBULATORY_CARE_PROVIDER_SITE_OTHER): Payer: BC Managed Care – PPO | Admitting: Otolaryngology

## 2020-12-24 DIAGNOSIS — R300 Dysuria: Secondary | ICD-10-CM | POA: Diagnosis not present

## 2020-12-24 DIAGNOSIS — R351 Nocturia: Secondary | ICD-10-CM | POA: Diagnosis not present

## 2020-12-24 DIAGNOSIS — R8271 Bacteriuria: Secondary | ICD-10-CM | POA: Diagnosis not present

## 2020-12-24 DIAGNOSIS — R3915 Urgency of urination: Secondary | ICD-10-CM | POA: Diagnosis not present

## 2020-12-25 DIAGNOSIS — D124 Benign neoplasm of descending colon: Secondary | ICD-10-CM | POA: Diagnosis not present

## 2020-12-25 DIAGNOSIS — Z1211 Encounter for screening for malignant neoplasm of colon: Secondary | ICD-10-CM | POA: Diagnosis not present

## 2020-12-25 DIAGNOSIS — Z86010 Personal history of colonic polyps: Secondary | ICD-10-CM | POA: Diagnosis not present

## 2020-12-28 ENCOUNTER — Other Ambulatory Visit: Payer: Self-pay | Admitting: Registered Nurse

## 2021-01-05 ENCOUNTER — Other Ambulatory Visit: Payer: Self-pay

## 2021-01-05 ENCOUNTER — Other Ambulatory Visit: Payer: Self-pay | Admitting: Registered Nurse

## 2021-01-05 ENCOUNTER — Ambulatory Visit (INDEPENDENT_AMBULATORY_CARE_PROVIDER_SITE_OTHER): Payer: BC Managed Care – PPO | Admitting: Orthopaedic Surgery

## 2021-01-05 ENCOUNTER — Encounter: Payer: Self-pay | Admitting: Orthopaedic Surgery

## 2021-01-05 DIAGNOSIS — M19031 Primary osteoarthritis, right wrist: Secondary | ICD-10-CM

## 2021-01-05 DIAGNOSIS — M19039 Primary osteoarthritis, unspecified wrist: Secondary | ICD-10-CM

## 2021-01-05 DIAGNOSIS — I10 Essential (primary) hypertension: Secondary | ICD-10-CM

## 2021-01-05 MED ORDER — BUPIVACAINE HCL 0.5 % IJ SOLN
1.0000 mL | INTRAMUSCULAR | Status: AC | PRN
  Administered 2021-01-05: 1 mL via INTRA_ARTICULAR

## 2021-01-05 NOTE — Progress Notes (Signed)
Office Visit Note   Patient: Bobby Mcguire           Date of Birth: 05-12-1966           MRN: [id number] Visit Date: 01/05/2021              Requested by: Bobby Coss, NP [place],   [id number] PCP: Bobby Coss, NP   Assessment & Plan: Visit Diagnoses:  1. Wrist arthritis     Plan: Bobby Mcguire has evidence of osteoarthritis of the right wrist particular in the radiocarpal joint by prior films.  He has had prior dorsal injection and wishes to have another as he has had some discomfort.  No recent injury or trauma.  Follow-Up Instructions: Return if symptoms worsen or fail to improve.   Orders:  No orders of the defined types were placed in this encounter.  No orders of the defined types were placed in this encounter.     Procedures: Medium Joint Inj: R radiocarpal on 01/05/2021 4:23 PM Details: 27 G needle, dorsal approach Medications: 1 mL bupivacaine 0.5 %  1 mL betamethasone injected with Marcaine in the dorsum of right wrist radiocarpal joint      Clinical Data: No additional findings.   Subjective: Chief Complaint  Patient presents with  . Right Wrist - Follow-up    Would like an injection  Experiencing recurrent symptoms of arthritis right wrist with pain along the radiocarpal joint.  No numbness or tingling.  No recent injury or trauma.  Prior films demonstrated arthritic changes diffusely about the wrist especially at the scapholunate and radial scaphoid joints  HPI  Review of Systems   Objective: Vital Signs: There were no vitals taken for this visit.  Physical Exam Constitutional:      Appearance: He is well-developed and well-nourished.  HENT:     Mouth/Throat:     Mouth: Oropharynx is clear and moist.  Eyes:     Extraocular Movements: EOM normal.     Pupils: Pupils are equal, round, and reactive to light.  Pulmonary:     Effort: Pulmonary effort is normal.  Skin:    General: Skin is warm and dry.  Neurological:      Mental Status: He is alert and oriented to person, place, and time.  Psychiatric:        Mood and Affect: Mood and affect normal.        Behavior: Behavior normal.     Ortho Exam right wrist with some discomfort at the radiocarpal joint.  Distal little bit of prominence.  Skin intact.  No erythema or ecchymosis.  No pain at the base of the thumb.  No pain with Finkelstein's maneuver.  Good sensation.  Little limitation of motion.  With dorsiflexion and volar flexion of the wrist.  No crepitation.  No masses  Specialty Comments:  No specialty comments available.  Imaging: No results found.   PMFS History: Patient Active Problem List   Diagnosis Date Noted  . Cervical radiculopathy at C8 07/09/2020  . Ganglion of flexor tendon sheath of right thumb 05/21/2020  . Saturday night paralysis of right upper extremity 05/21/2020  . Wrist arthritis 05/21/2020  . Alcoholic peripheral neuropathy ([place]) 04/08/2020  . Laryngopharyngeal reflux (LPR) 01/14/2020  . Pharyngoesophageal dysphagia 01/14/2020  . Malignant neoplasm of prostate ([place]) 08/06/2019  . Morbid (severe) obesity due to excess calories ([place]) 02/13/2019  . Osteoarthritis of knee 01/09/2018  . Erectile dysfunction 09/19/2017  . Essential hypertension 06/22/2017  .  Severe obstructive sleep apnea-hypopnea syndrome 05/07/2010   Past Medical History:  Diagnosis Date  . Hypertension   . Prostate cancer ([place])   . Prostate cancer ([place])   . Sleep apnea     Family History  Problem Relation Age of Onset  . Colon cancer Mother   . Breast cancer Neg Hx   . Prostate cancer Neg Hx     Past Surgical History:  Procedure Laterality Date  . PROSTATE BIOPSY     Social History   Occupational History  . Not on file  Tobacco Use  . Smoking status: Never Smoker  . Smokeless tobacco: Never Used  Vaping Use  . Vaping Use: Never used  Substance and Sexual Activity  . Alcohol use: Yes    Alcohol/week: 3.0 standard drinks     Types: 3 Glasses of wine per week    Comment: reports he drinks a beer on occasion  . Drug use: No  . Sexual activity: Yes     Bobby Balding, MD   Note - This record has been created using [employer].  Chart creation errors have been sought, but may not always  have been located. Such creation errors do not reflect on  the standard of medical care.

## 2021-01-12 DIAGNOSIS — C61 Malignant neoplasm of prostate: Secondary | ICD-10-CM | POA: Diagnosis not present

## 2021-01-14 DIAGNOSIS — G4733 Obstructive sleep apnea (adult) (pediatric): Secondary | ICD-10-CM | POA: Diagnosis not present

## 2021-01-19 DIAGNOSIS — C61 Malignant neoplasm of prostate: Secondary | ICD-10-CM | POA: Diagnosis not present

## 2021-01-19 DIAGNOSIS — R300 Dysuria: Secondary | ICD-10-CM | POA: Diagnosis not present

## 2021-01-19 DIAGNOSIS — R3915 Urgency of urination: Secondary | ICD-10-CM | POA: Diagnosis not present

## 2021-01-19 DIAGNOSIS — R3121 Asymptomatic microscopic hematuria: Secondary | ICD-10-CM | POA: Diagnosis not present

## 2021-01-22 DIAGNOSIS — R310 Gross hematuria: Secondary | ICD-10-CM | POA: Diagnosis not present

## 2021-01-25 DIAGNOSIS — R310 Gross hematuria: Secondary | ICD-10-CM | POA: Diagnosis not present

## 2021-01-25 DIAGNOSIS — N3289 Other specified disorders of bladder: Secondary | ICD-10-CM | POA: Diagnosis not present

## 2021-01-25 DIAGNOSIS — R3129 Other microscopic hematuria: Secondary | ICD-10-CM | POA: Diagnosis not present

## 2021-01-25 DIAGNOSIS — R3121 Asymptomatic microscopic hematuria: Secondary | ICD-10-CM | POA: Diagnosis not present

## 2021-01-25 DIAGNOSIS — Z8546 Personal history of malignant neoplasm of prostate: Secondary | ICD-10-CM | POA: Diagnosis not present

## 2021-02-05 DIAGNOSIS — R310 Gross hematuria: Secondary | ICD-10-CM | POA: Diagnosis not present

## 2021-02-05 DIAGNOSIS — C61 Malignant neoplasm of prostate: Secondary | ICD-10-CM | POA: Diagnosis not present

## 2021-02-05 DIAGNOSIS — N3041 Irradiation cystitis with hematuria: Secondary | ICD-10-CM | POA: Diagnosis not present

## 2021-03-15 ENCOUNTER — Other Ambulatory Visit: Payer: Self-pay

## 2021-03-15 ENCOUNTER — Other Ambulatory Visit: Payer: Self-pay | Admitting: Emergency Medicine

## 2021-03-15 ENCOUNTER — Encounter (HOSPITAL_BASED_OUTPATIENT_CLINIC_OR_DEPARTMENT_OTHER): Payer: BC Managed Care – PPO | Attending: Internal Medicine | Admitting: Internal Medicine

## 2021-03-15 DIAGNOSIS — Z923 Personal history of irradiation: Secondary | ICD-10-CM | POA: Diagnosis not present

## 2021-03-15 DIAGNOSIS — N3041 Irradiation cystitis with hematuria: Secondary | ICD-10-CM | POA: Insufficient documentation

## 2021-03-15 DIAGNOSIS — Z9221 Personal history of antineoplastic chemotherapy: Secondary | ICD-10-CM | POA: Insufficient documentation

## 2021-03-15 DIAGNOSIS — Z8546 Personal history of malignant neoplasm of prostate: Secondary | ICD-10-CM | POA: Insufficient documentation

## 2021-03-15 DIAGNOSIS — Z6841 Body Mass Index (BMI) 40.0 and over, adult: Secondary | ICD-10-CM | POA: Diagnosis not present

## 2021-03-15 DIAGNOSIS — I872 Venous insufficiency (chronic) (peripheral): Secondary | ICD-10-CM | POA: Insufficient documentation

## 2021-03-15 DIAGNOSIS — E785 Hyperlipidemia, unspecified: Secondary | ICD-10-CM

## 2021-03-15 NOTE — Progress Notes (Signed)
ARK, AGRUSA ([id number]) Visit Report for 03/15/2021 Chief Complaint Document Details Patient Name: Date of Service: Bobby Glasgow D. 03/15/2021 9:00 A M Medical Record Number: [id number] Patient Account Number: [id number] Date of Birth/Sex: Treating RN: 22-Jul-1966 (55 y.o. [name] Primary Care Provider: Maximiano Coss Other Clinician: Referring Provider: Treating Provider/Extender: Altamese [place], Anabel Bene in Treatment: 0 Information Obtained from: Patient Chief Complaint 03/15/2021; patient is here for consideration of hyperbaric oxygen treatment for radiation cystitis Electronic Signature(s) Signed: 03/15/2021 5:22:38 PM By: Linton Ham MD Entered By: Linton Ham on 03/15/2021 10:28:44 -------------------------------------------------------------------------------- HPI Details Patient Name: Date of Service: Bobby Glasgow D. 03/15/2021 9:00 A M Medical Record Number: [id number] Patient Account Number: [id number] Date of Birth/Sex: Treating RN: [birth year] (55 y.o. [name] Primary Care Provider: Maximiano Coss Other Clinician: Referring Provider: Treating Provider/Extender: Mallie Snooks in Treatment: 0 History of Present Illness HPI Description: ADMISSION 03/15/2021 This is a 55 year old man who is referred for consideration of hyperbaric oxygen for radiation cystitis. He is followed at [facility] urology by Dr. Gloriann Loan. His prostate cancer was diagnosed in 07/16/2019 [facility] Found he had elevated PC PSA but opted initially for active surveillance. He was ultimately treated treated with neoadjuvant chemotherapy as well as radiation. The time of this dictation I do not have a lot of details. His radiation was completed on 11/29/2019. I am uncertain whether this was done locally or not. By January 2021 he started to complain of urinary frequency and urgency and an intermittent sensation of incomplete bladder emptying. He is up  2-3 times a night. This is been a reasonably constant pattern for him. He has learned that if he has any sensation to void he must void or he might become incontinent. He had some bleeding recently. This was described in his urology notes on 01/23/2020 his gross hematuria but he says that this abated spontaneously. He did feel there were clots as well as some terminal dysuria. Patient underwent a CT scan that showed no evidence of genitourinary abnormality but there was thickening of his bladder wall consistent with cystitis. His urologist felt he had radiation cystitis. The patient had a cystoscopy I believe on 02/05/2021. They felt his prostate was borderline obstructing. The bladder wall showed hypervascularity consistent with radiation cystitis there were no tumors. Past medical history includes osteoarthritis, obstructive sleep apnea on CPAP, morbid obesity. Venous insufficiency. He does not have significant cardiopulmonary abnormalities he is a non-smoker Engineer, maintenance) Signed: 03/15/2021 5:22:38 PM By: Linton Ham MD Entered By: Linton Ham on 03/15/2021 10:36:29 -------------------------------------------------------------------------------- Physical Exam Details Patient Name: Date of Service: Bobby Glasgow D. 03/15/2021 9:00 A M Medical Record Number: [id number] Patient Account Number: [id number] Date of Birth/Sex: Treating RN: 1966-09-21 (55 y.o. [name] Primary Care Provider: Maximiano Coss Other Clinician: Referring Provider: Treating Provider/Extender: Mallie Snooks in Treatment: 0 Constitutional Sitting or standing Blood Pressure is within target range for patient.. Pulse regular and within target range for patient.Marland Kitchen Respirations regular, non-labored and within target range.. Temperature is normal and within the target range for the patient.Marland Kitchen Appears in no distress. Respiratory work of breathing is normal. Bilateral breath  sounds are clear and equal in all lobes with no wheezes, rales or rhonchi.. Cardiovascular Heart rhythm and rate regular, without murmur or gallop.. Looks like some degree of chronic venous insufficiency and has lower legs. Electronic Signature(s) Signed: 03/15/2021 5:22:38 PM By: Linton Ham MD Entered By: Linton Ham on 03/15/2021  10:35:51 -------------------------------------------------------------------------------- Physician Orders Details Patient Name: Date of Service: Bobby Glasgow D. 03/15/2021 9:00 A M Medical Record Number: [id number] Patient Account Number: [id number] Date of Birth/Sex: Treating RN: [birth year] (55 y.o. [name] Primary Care Provider: Maximiano Coss Other Clinician: Referring Provider: Treating Provider/Extender: Mallie Snooks in Treatment: 0 Verbal / Phone Orders: No Diagnosis Coding Follow-up Appointments Other: - We will call you to schedule start date for Hyperbarics once we receive approval from insurance Hyperbaric Oxygen Therapy Evaluate for HBO Therapy Indication: - Radiation Cystitis If appropriate for treatment, begin HBOT per protocol: 2.5 ATA for 90 Minutes with 2 Five (5) Minute A Breaks ir Total Number of Treatments: - 40 One treatments per day (delivered Monday through Friday unless otherwise specified in Special Instructions below): A frin (Oxymetazoline HCL) 0.05% nasal spray - 1 spray in both nostrils daily as needed prior to HBO treatment for difficulty clearing ears Radiology X-ray, Chest, PA and Lateral - Evaluation for Hyperbaric Oxygen Therapy Electronic Signature(s) Signed: 03/15/2021 5:22:38 PM By: Linton Ham MD Signed: 03/15/2021 5:28:50 PM By: Levan Hurst RN, BSN Entered By: Levan Hurst on 03/15/2021 10:24:07 Prescription 03/15/2021 -------------------------------------------------------------------------------- Catarina Hartshorn D. Linton Ham MD Patient  Name: Provider: 1966-05-11 [id number] Date of Birth: NPI#: Jerilynn Mages [id number] Sex: DEA #: [id number] Phone #: License #: [place] Patient Address: [address], [place] [address] [place], [place] [address] [phone] Allergies No Known Allergies Provider's Orders X-ray, Chest, PA and Lateral - Evaluation for Hyperbaric Oxygen Therapy Hand Signature: Date(s): Electronic Signature(s) Signed: 03/15/2021 5:22:38 PM By: Linton Ham MD Signed: 03/15/2021 5:28:50 PM By: Levan Hurst RN, BSN Entered By: Levan Hurst on 03/15/2021 10:24:07 -------------------------------------------------------------------------------- Problem List Details Patient Name: Date of Service: Bobby Glasgow D. 03/15/2021 9:00 A M Medical Record Number: [id number] Patient Account Number: [id number] Date of Birth/Sex: Treating RN: 1966/10/22 (55 y.o. [name] Primary Care Provider: Maximiano Coss Other Clinician: Referring Provider: Treating Provider/Extender: Altamese [place], Anabel Bene in Treatment: 0 Active Problems ICD-10 Encounter Code Description Active Date MDM Diagnosis N30.41 Irradiation cystitis with hematuria 03/15/2021 No Yes Z85.46 Personal history of malignant neoplasm of prostate 03/15/2021 No Yes R35.0 Frequency of micturition 03/15/2021 No Yes Inactive Problems Resolved Problems Electronic Signature(s) Signed: 03/15/2021 5:22:38 PM By: Linton Ham MD Entered By: Linton Ham on 03/15/2021 10:28:21 -------------------------------------------------------------------------------- Progress Note Details Patient Name: Date of Service: Bobby Glasgow D. 03/15/2021 9:00 A M Medical Record Number: [id number] Patient Account Number: [id number] Date of Birth/Sex: Treating RN: April 05, 1966 (55 y.o. [name] Primary Care Provider: Maximiano Coss Other Clinician: Referring  Provider: Treating Provider/Extender: Altamese [place], Anabel Bene in Treatment: 0 Subjective Chief Complaint Information obtained from Patient 03/15/2021; patient is here for consideration of hyperbaric oxygen treatment for radiation cystitis History of Present Illness (HPI) ADMISSION 03/15/2021 This is a 55 year old man who is referred for consideration of hyperbaric oxygen for radiation cystitis. He is followed at [facility] urology by Dr. Gloriann Loan. His prostate cancer was diagnosed in 07/16/2019 Gwenlyn Found he had elevated PC PSA but opted initially for active surveillance. He was ultimately treated treated with neoadjuvant chemotherapy as well as radiation. The time of this dictation I do not have a lot of details. His radiation was completed on 11/29/2019. I am uncertain whether this was done locally or not. By January 2021 he started to complain of urinary frequency and urgency and an intermittent sensation  of incomplete bladder emptying. He is up 2-3 times a night. This is been a reasonably constant pattern for him. He has learned that if he has any sensation to void he must void or he might become incontinent. He had some bleeding recently. This was described in his urology notes on 01/23/2020 his gross hematuria but he says that this abated spontaneously. He did feel there were clots as well as some terminal dysuria. Patient underwent a CT scan that showed no evidence of genitourinary abnormality but there was thickening of his bladder wall consistent with cystitis. His urologist felt he had radiation cystitis. The patient had a cystoscopy I believe on 02/05/2021. They felt his prostate was borderline obstructing. The bladder wall showed hypervascularity consistent with radiation cystitis there were no tumors. Past medical history includes osteoarthritis, obstructive sleep apnea on CPAP, morbid obesity. Venous insufficiency. He does not have significant cardiopulmonary abnormalities he is a  non-smoker Patient History Information obtained from Patient. Allergies No Known Allergies Family History Cancer - Mother, No family history of Diabetes, Heart Disease, Hereditary Spherocytosis, Hypertension, Kidney Disease, Lung Disease, Seizures, Stroke, Thyroid Problems, Tuberculosis. Social History Never smoker, Marital Status - Married, Alcohol Use - Moderate, Drug Use - No History, Caffeine Use - Rarely. Medical History Eyes Denies history of Cataracts, Glaucoma, Optic Neuritis Ear/Nose/Mouth/Throat Denies history of Chronic sinus problems/congestion, Middle ear problems Hematologic/Lymphatic Denies history of Anemia, Hemophilia, Human Immunodeficiency Virus, Lymphedema, Sickle Cell Disease Respiratory Patient has history of Sleep Apnea - CPAP Denies history of Aspiration, Asthma, Chronic Obstructive Pulmonary Disease (COPD), Pneumothorax, Tuberculosis Cardiovascular Patient has history of Hypertension Denies history of Angina, Arrhythmia, Congestive Heart Failure, Coronary Artery Disease, Deep Vein Thrombosis, Hypotension, Myocardial Infarction, Peripheral Arterial Disease, Peripheral Venous Disease, Phlebitis, Vasculitis Gastrointestinal Denies history of Cirrhosis , Colitis, Crohnoos, Hepatitis A, Hepatitis B, Hepatitis C Endocrine Denies history of Type I Diabetes, Type II Diabetes Genitourinary Denies history of End Stage Renal Disease Immunological Denies history of Lupus Erythematosus, Raynaudoos, Scleroderma Integumentary (Skin) Denies history of History of Burn Musculoskeletal Patient has history of Osteoarthritis Denies history of Gout, Rheumatoid Arthritis, Osteomyelitis Neurologic Denies history of Dementia, Neuropathy, Quadriplegia, Paraplegia, Seizure Disorder Oncologic Denies history of Received Chemotherapy, Received Radiation Medical A Surgical History Notes nd Respiratory laryngopharyngeal reflux Oncologic hx of prostate cancer in 2019, hx of  radiation and chemo Review of Systems (ROS) Constitutional Symptoms (General Health) Denies complaints or symptoms of Fatigue, Fever, Chills, Marked Weight Change. Eyes Denies complaints or symptoms of Dry Eyes, Vision Changes, Glasses / Contacts. Ear/Nose/Mouth/Throat Denies complaints or symptoms of Chronic sinus problems or rhinitis. Respiratory Denies complaints or symptoms of Chronic or frequent coughs, Shortness of Breath. Cardiovascular Denies complaints or symptoms of Chest pain. Gastrointestinal Denies complaints or symptoms of Frequent diarrhea, Nausea, Vomiting. Endocrine Denies complaints or symptoms of Heat/cold intolerance. Genitourinary Denies complaints or symptoms of Frequent urination. Integumentary (Skin) Denies complaints or symptoms of Wounds. Musculoskeletal Denies complaints or symptoms of Muscle Pain, Muscle Weakness. Neurologic Denies complaints or symptoms of Numbness/parasthesias. Psychiatric Denies complaints or symptoms of Claustrophobia, Suicidal. Objective Constitutional Sitting or standing Blood Pressure is within target range for patient.. Pulse regular and within target range for patient.Marland Kitchen Respirations regular, non-labored and within target range.. Temperature is normal and within the target range for the patient.Marland Kitchen Appears in no distress. Vitals Time Taken: 9:32 AM, Height: 75 in, Source: Stated, Weight: 330 lbs, Source: Stated, BMI: 41.2, Temperature: 97.7 F, Pulse: 74 bpm, Respiratory Rate: 17 breaths/min, Blood Pressure: 124/64 mmHg. Respiratory work  of breathing is normal. Bilateral breath sounds are clear and equal in all lobes with no wheezes, rales or rhonchi.. Cardiovascular Heart rhythm and rate regular, without murmur or gallop.. Looks like some degree of chronic venous insufficiency and has lower legs. Assessment Active Problems ICD-10 Irradiation cystitis with hematuria Personal history of malignant neoplasm of  prostate Frequency of micturition Plan Follow-up Appointments: Other: - We will call you to schedule start date for Hyperbarics once we receive approval from insurance Hyperbaric Oxygen Therapy: Evaluate for HBO Therapy Indication: - Radiation Cystitis If appropriate for treatment, begin HBOT per protocol: 2.5 ATA for 90 Minutes with 2 Five (5) Minute Air Breaks T Number of Treatments: - 40 otal One treatments per day (delivered Monday through Friday unless otherwise specified in Special Instructions below): Afrin (Oxymetazoline HCL) 0.05% nasal spray - 1 spray in both nostrils daily as needed prior to HBO treatment for difficulty clearing ears Radiology ordered were: X-ray, Chest, PA and Lateral - Evaluation for Hyperbaric Oxygen Therapy 1. This is a patient with radiation cystitis evaluated by his urologist with CT scanning and the recent cystoscopy in February. 2. He says his bleeding abated some weeks ago but he still has marked lower urinary tract symptoms including frequency, hesitancy, some terminal pain. The pain part of this can come from bladder spasms. 3. He has no medical contraindications to hyperbaric oxygen although we will do a chest x-ray 4. The patient works at [employer]. 5. I went over hyperbaric oxygen benefits and risks. The patient seems somewhat hesitant. We will see if he wants to go forward with this after looking at the chambers with our HBO nurse Electronic Signature(s) Signed: 03/15/2021 5:22:38 PM By: Linton Ham MD Entered By: Linton Ham on 03/15/2021 10:37:44 -------------------------------------------------------------------------------- HxROS Details Patient Name: Date of Service: Bobby Glasgow D. 03/15/2021 9:00 A M Medical Record Number: [id number] Patient Account Number: [id number] Date of Birth/Sex: Treating RN: [birth year] (55 y.o. Bobby Mcguire Primary Care Provider: Maximiano Coss Other Clinician: Referring Provider: Treating  Provider/Extender: Mallie Snooks in Treatment: 0 Information Obtained From Patient Constitutional Symptoms (General Health) Complaints and Symptoms: Negative for: Fatigue; Fever; Chills; Marked Weight Change Eyes Complaints and Symptoms: Negative for: Dry Eyes; Vision Changes; Glasses / Contacts Medical History: Negative for: Cataracts; Glaucoma; Optic Neuritis Ear/Nose/Mouth/Throat Complaints and Symptoms: Negative for: Chronic sinus problems or rhinitis Medical History: Negative for: Chronic sinus problems/congestion; Middle ear problems Respiratory Complaints and Symptoms: Negative for: Chronic or frequent coughs; Shortness of Breath Medical History: Positive for: Sleep Apnea - CPAP Negative for: Aspiration; Asthma; Chronic Obstructive Pulmonary Disease (COPD); Pneumothorax; Tuberculosis Past Medical History Notes: laryngopharyngeal reflux Cardiovascular Complaints and Symptoms: Negative for: Chest pain Medical History: Positive for: Hypertension Negative for: Angina; Arrhythmia; Congestive Heart Failure; Coronary Artery Disease; Deep Vein Thrombosis; Hypotension; Myocardial Infarction; Peripheral Arterial Disease; Peripheral Venous Disease; Phlebitis; Vasculitis Gastrointestinal Complaints and Symptoms: Negative for: Frequent diarrhea; Nausea; Vomiting Medical History: Negative for: Cirrhosis ; Colitis; Crohns; Hepatitis A; Hepatitis B; Hepatitis C Endocrine Complaints and Symptoms: Negative for: Heat/cold intolerance Medical History: Negative for: Type I Diabetes; Type II Diabetes Genitourinary Complaints and Symptoms: Negative for: Frequent urination Medical History: Negative for: End Stage Renal Disease Integumentary (Skin) Complaints and Symptoms: Negative for: Wounds Medical History: Negative for: History of Burn Musculoskeletal Complaints and Symptoms: Negative for: Muscle Pain; Muscle Weakness Medical History: Positive for:  Osteoarthritis Negative for: Gout; Rheumatoid Arthritis; Osteomyelitis Neurologic Complaints and Symptoms: Negative for: Numbness/parasthesias Medical History: Negative for: Dementia; Neuropathy;  Quadriplegia; Paraplegia; Seizure Disorder Psychiatric Complaints and Symptoms: Negative for: Claustrophobia; Suicidal Hematologic/Lymphatic Medical History: Negative for: Anemia; Hemophilia; Human Immunodeficiency Virus; Lymphedema; Sickle Cell Disease Immunological Medical History: Negative for: Lupus Erythematosus; Raynauds; Scleroderma Oncologic Medical History: Negative for: Received Chemotherapy; Received Radiation Past Medical History Notes: hx of prostate cancer in 2019, hx of radiation and chemo Immunizations Implantable Devices None Family and Social History Cancer: Yes - Mother; Diabetes: No; Heart Disease: No; Hereditary Spherocytosis: No; Hypertension: No; Kidney Disease: No; Lung Disease: No; Seizures: No; Stroke: No; Thyroid Problems: No; Tuberculosis: No; Never smoker; Marital Status - Married; Alcohol Use: Moderate; Drug Use: No History; Caffeine Use: Rarely; Financial Concerns: No; Food, Clothing or Shelter Needs: No; Support System Lacking: No; Transportation Concerns: No Electronic Signature(s) Signed: 03/15/2021 5:14:32 PM By: Rhae Hammock RN Signed: 03/15/2021 5:22:38 PM By: Linton Ham MD Entered By: Rhae Hammock on 03/15/2021 09:48:31 -------------------------------------------------------------------------------- SuperBill Details Patient Name: Date of Service: Bobby Glasgow D. 03/15/2021 Medical Record Number: [id number] Patient Account Number: [id number] Date of Birth/Sex: Treating RN: 10-21-1966 (55 y.o. [name] Primary Care Provider: Maximiano Coss Other Clinician: Referring Provider: Treating Provider/Extender: Mallie Snooks in Treatment: 0 Diagnosis Coding ICD-10 Codes Code Description N30.41  Irradiation cystitis with hematuria Z85.46 Personal history of malignant neoplasm of prostate R35.0 Frequency of micturition Facility Procedures CPT4 Code: [id number] Description: [phone] - WOUND CARE VISIT-LEV 3 EST PT Modifier: Quantity: 1 Physician Procedures : CPT4 Code Description Modifier [id number] [place] PHYS LEVEL 3 NEW PT ICD-10 Diagnosis Description N30.41 Irradiation cystitis with hematuria Z85.46 Personal history of malignant neoplasm of prostate R35.0 Frequency of micturition Quantity: 1 Electronic Signature(s) Signed: 03/15/2021 5:22:38 PM By: Linton Ham MD Signed: 03/15/2021 5:28:50 PM By: Levan Hurst RN, BSN Entered By: Levan Hurst on 03/15/2021 13:55:00

## 2021-03-15 NOTE — Progress Notes (Signed)
Bobby Mcguire, Bobby Mcguire ([id number]) Visit Report for 03/15/2021 Abuse/Suicide Risk Screen Details Patient Name: Date of Service: Bobby Glasgow D. 03/15/2021 9:00 A M Medical Record Number: [id number] Patient Account Number: [id number] Date of Birth/Sex: Treating RN: 05-Nov-1966 (55 y.o. Bobby Mcguire, Bobby Mcguire Primary Care Bobby Mcguire: Bobby Mcguire Other Clinician: Referring Bobby Mcguire: Treating Bobby Mcguire/Extender: Bobby Mcguire in Treatment: 0 Abuse/Suicide Risk Screen Items Answer ABUSE RISK SCREEN: Has anyone close to you tried to hurt or harm you recentlyo No Do you feel uncomfortable with anyone in your familyo No Has anyone forced you do things that you didnt want to doo No Electronic Signature(s) Signed: 03/15/2021 5:14:32 PM By: Bobby Hammock RN Entered By: Bobby Mcguire on 03/15/2021 09:35:52 -------------------------------------------------------------------------------- Activities of Daily Living Details Patient Name: Date of Service: Bobby Glasgow D. 03/15/2021 9:00 A M Medical Record Number: [id number] Patient Account Number: [id number] Date of Birth/Sex: Treating RN: [birth year] (55 y.o. Bobby Mcguire, Bobby Mcguire Primary Care Bobby Mcguire: Bobby Mcguire Other Clinician: Referring Kamaree Berkel: Treating Bobby Mcguire/Extender: Bobby Mcguire in Treatment: 0 Activities of Daily Living Items Answer Activities of Daily Living (Please select one for each item) Drive Automobile Completely Able T Medications ake Completely Able Use T elephone Completely Able Care for Appearance Completely Able Use T oilet Completely Able Bath / Shower Completely Able Dress Self Completely Able Feed Self Completely Able Walk Completely Able Get In / Out Bed Completely Able Housework Completely Able Prepare Meals Completely [place] Completely Able Shop for Self Completely Able Electronic Signature(s) Signed: 03/15/2021 5:14:32 PM By: Bobby Hammock RN Entered By: Bobby Mcguire on 03/15/2021 09:36:39 -------------------------------------------------------------------------------- Education Screening Details Patient Name: Date of Service: Bobby Glasgow D. 03/15/2021 9:00 A M Medical Record Number: [id number] Patient Account Number: [id number] Date of Birth/Sex: Treating RN: 29-Nov-1966 (55 y.o. Bobby Mcguire, Bobby Mcguire Primary Care Bobby Mcguire: Bobby Mcguire Other Clinician: Referring Bobby Mcguire: Treating Bobby Mcguire/Extender: Bobby Mcguire in Treatment: 0 Primary Learner Assessed: Patient Learning Preferences/Education Level/Primary Language Learning Preference: Explanation, Communication Board, Printed Material Highest Education Level: College or Above Preferred Language: English Cognitive Barrier Language Barrier: No Translator Needed: No Memory Deficit: No Emotional Barrier: No Cultural/Religious Beliefs Affecting Medical Care: No Physical Barrier Impaired Vision: No Impaired Hearing: No Knowledge/Comprehension Knowledge Level: High Comprehension Level: High Ability to understand written instructions: High Ability to understand verbal instructions: High Motivation Anxiety Level: Calm Cooperation: Cooperative Education Importance: Denies Need Interest in Health Problems: Asks Questions Perception: Coherent Willingness to Engage in Self-Management High Activities: Readiness to Engage in Self-Management High Activities: Electronic Signature(s) Signed: 03/15/2021 5:14:32 PM By: Bobby Hammock RN Entered By: Bobby Mcguire on 03/15/2021 09:48:47 -------------------------------------------------------------------------------- Fall Risk Assessment Details Patient Name: Date of Service: Bobby Glasgow D. 03/15/2021 9:00 A M Medical Record Number: [id number] Patient Account Number: [id number] Date of Birth/Sex: Treating RN: March 26, 1966 (55 y.o. Bobby Mcguire, Bobby Mcguire Primary Care  Judah Mcguire: Bobby Mcguire Other Clinician: Referring Bobby Mcguire: Treating Bobby Mcguire/Extender: Bobby Mcguire in Treatment: 0 Fall Risk Assessment Items Have you had 2 or more falls in the last 12 monthso 0 No Have you had any fall that resulted in injury in the last 12 monthso 0 No FALLS RISK SCREEN History of falling - immediate or within 3 months 0 No Secondary diagnosis (Do you have 2 or more medical diagnoseso) 0 No Ambulatory aid None/bed rest/wheelchair/nurse 0 No Crutches/cane/walker 0 No Furniture 0 No Intravenous therapy Access/Saline/Heparin Lock 0 No Gait/Transferring Normal/ bed rest/ wheelchair 0 No Weak (  short steps with or without shuffle, stooped but able to lift head while walking, may seek 0 No support from furniture) Impaired (short steps with shuffle, may have difficulty arising from chair, head down, impaired 0 No balance) Mental Status Oriented to own ability 0 No Electronic Signature(s) Signed: 03/15/2021 5:14:32 PM By: Bobby Hammock RN Entered By: Bobby Mcguire on 03/15/2021 09:38:51 -------------------------------------------------------------------------------- Foot Assessment Details Patient Name: Date of Service: Bobby Glasgow D. 03/15/2021 9:00 A M Medical Record Number: [id number] Patient Account Number: [id number] Date of Birth/Sex: Treating RN: 12/20/1965 (55 y.o. Bobby Mcguire, Bobby Mcguire Primary Care Bobby Mcguire: Bobby Mcguire Other Clinician: Referring Bobby Mcguire: Treating Bobby Mcguire/Extender: Bobby Mcguire in Treatment: 0 Foot Assessment Items Site Locations + = Sensation present, - = Sensation absent, C = Callus, U = Ulcer R = Redness, W = Warmth, M = Maceration, PU = Pre-ulcerative lesion F = Fissure, S = Swelling, D = Dryness Assessment Right: Left: Other Deformity: No No Prior Foot Ulcer: No No Prior Amputation: No No Charcot Joint: No No Ambulatory Status: Gait: Electronic  Signature(s) Signed: 03/15/2021 5:14:32 PM By: Bobby Hammock RN Entered By: Bobby Mcguire on 03/15/2021 09:49:05 -------------------------------------------------------------------------------- Nutrition Risk Screening Details Patient Name: Date of Service: Bobby Glasgow D. 03/15/2021 9:00 A M Medical Record Number: [id number] Patient Account Number: [id number] Date of Birth/Sex: Treating RN: [birth year] (55 y.o. Bobby Mcguire, Bobby Mcguire Primary Care Cyler Kappes: Bobby Mcguire Other Clinician: Referring Rajat Staver: Treating Hazael Olveda/Extender: Altamese La Dolores, Anabel Bene in Treatment: 0 Height (in): 75 Weight (lbs): 330 Body Mass Index (BMI): 41.2 Nutrition Risk Screening Items Score Screening NUTRITION RISK SCREEN: I have an illness or condition that made me change the kind and/or amount of food I eat 0 No I eat fewer than two meals per day 0 No I eat few fruits and vegetables, or milk products 0 No I have three or more drinks of beer, liquor or wine almost every day 0 No I have tooth or mouth problems that make it hard for me to eat 0 No I don't always have enough money to buy the food I need 0 No I eat alone most of the time 0 No I take three or more different prescribed or over-the-counter drugs a day 0 No Without wanting to, I have lost or gained 10 pounds in the last six months 0 No I am not always physically able to shop, cook and/or feed myself 0 No Nutrition Protocols Good Risk Protocol 0 No interventions needed Moderate Risk Protocol High Risk Proctocol Risk Level: Good Risk Score: 0 Electronic Signature(s) Signed: 03/15/2021 5:14:32 PM By: Bobby Hammock RN Entered By: Bobby Mcguire on 03/15/2021 09:39:01

## 2021-03-15 NOTE — Progress Notes (Signed)
[name], [name] ([id number]) Visit Report for 03/15/2021 Allergy List Details Patient Name: Date of Service: Bobby Glasgow D. 03/15/2021 9:00 A M Medical Record Number: [id number] Patient Account Number: [id number] Date of Birth/Sex: Treating RN: Apr 06, 1966 (55 y.o. Bobby Mcguire, Bobby Mcguire Primary Care Josette Shimabukuro: Maximiano Coss Other Clinician: Referring Latasha Buczkowski: Treating Flint Hakeem/Extender: Mallie Snooks in Treatment: 0 Allergies Active Allergies No Known Allergies Allergy Notes Electronic Signature(s) Signed: 03/15/2021 5:14:32 PM By: Rhae Hammock RN Entered By: Rhae Hammock on 03/15/2021 09:40:36 -------------------------------------------------------------------------------- Arrival Information Details Patient Name: Date of Service: Bobby Glasgow D. 03/15/2021 9:00 A M Medical Record Number: [id number] Patient Account Number: [id number] Date of Birth/Sex: Treating RN: October 20, 1966 (55 y.o. Bobby Mcguire, Bobby Mcguire Primary Care Stephenie Navejas: Maximiano Coss Other Clinician: Referring Breniya Goertzen: Treating Page Pucciarelli/Extender: Mallie Snooks in Treatment: 0 Visit Information Patient Arrived: Ambulatory Arrival Time: 09:32 Accompanied By: self Transfer Assistance: None Patient Identification Verified: Yes Secondary Verification Process Completed: Yes Patient Requires Transmission-Based Precautions: No Patient Has Alerts: No Electronic Signature(s) Signed: 03/15/2021 5:14:32 PM By: Rhae Hammock RN Entered By: Rhae Hammock on 03/15/2021 09:32:16 -------------------------------------------------------------------------------- Clinic Level of Care Assessment Details Patient Name: Date of Service: Bobby Glasgow D. 03/15/2021 9:00 A M Medical Record Number: [id number] Patient Account Number: [id number] Date of Birth/Sex: Treating RN: 1966/09/15 (55 y.o. Bobby Mcguire Primary Care Jamespaul Secrist: Maximiano Coss Other  Clinician: Referring Kameelah Minish: Treating Barbi Kumagai/Extender: Mallie Snooks in Treatment: 0 Clinic Level of Care Assessment Items TOOL 2 Quantity Score X- 1 0 Use when only an EandM is performed on the INITIAL visit ASSESSMENTS - Nursing Assessment / Reassessment X- 1 20 General Physical Exam (combine w/ comprehensive assessment (listed just below) when performed on new pt. evals) X- 1 25 Comprehensive Assessment (HX, ROS, Risk Assessments, Wounds Hx, etc.) ASSESSMENTS - Wound and Skin A ssessment / Reassessment []  - 0 Simple Wound Assessment / Reassessment - one wound []  - 0 Complex Wound Assessment / Reassessment - multiple wounds []  - 0 Dermatologic / Skin Assessment (not related to wound area) ASSESSMENTS - Ostomy and/or Continence Assessment and Care []  - 0 Incontinence Assessment and Management []  - 0 Ostomy Care Assessment and Management (repouching, etc.) PROCESS - Coordination of Care X - Simple Patient / Family Education for ongoing care 1 15 []  - 0 Complex (extensive) Patient / Family Education for ongoing care X- 1 10 Staff obtains Programmer, systems, Records, T Results / Process Orders est []  - 0 Staff telephones HHA, Nursing Homes / Clarify orders / etc []  - 0 Routine Transfer to another Facility (non-emergent condition) []  - 0 Routine Hospital Admission (non-emergent condition) X- 1 15 New Admissions / Biomedical engineer / Ordering NPWT Apligraf, etc. , []  - 0 Emergency Hospital Admission (emergent condition) X- 1 10 Simple Discharge Coordination []  - 0 Complex (extensive) Discharge Coordination PROCESS - Special Needs []  - 0 Pediatric / Minor Patient Management []  - 0 Isolation Patient Management []  - 0 Hearing / Language / Visual special needs []  - 0 Assessment of Community assistance (transportation, D/C planning, etc.) []  - 0 Additional assistance / Altered mentation []  - 0 Support Surface(s) Assessment (bed, cushion,  seat, etc.) INTERVENTIONS - Wound Cleansing / Measurement []  - 0 Wound Imaging (photographs - any number of wounds) []  - 0 Wound Tracing (instead of photographs) []  - 0 Simple Wound Measurement - one wound []  - 0 Complex Wound Measurement - multiple wounds []  - 0 Simple Wound Cleansing - one wound []  -  0 Complex Wound Cleansing - multiple wounds INTERVENTIONS - Wound Dressings []  - 0 Small Wound Dressing one or multiple wounds []  - 0 Medium Wound Dressing one or multiple wounds []  - 0 Large Wound Dressing one or multiple wounds []  - 0 Application of Medications - injection INTERVENTIONS - Miscellaneous []  - 0 External ear exam []  - 0 Specimen Collection (cultures, biopsies, blood, body fluids, etc.) []  - 0 Specimen(s) / Culture(s) sent or taken to Lab for analysis []  - 0 Patient Transfer (multiple staff / Harrel Lemon Lift / Similar devices) []  - 0 Simple Staple / Suture removal (25 or less) []  - 0 Complex Staple / Suture removal (26 or more) []  - 0 Hypo / Hyperglycemic Management (close monitor of Blood Glucose) []  - 0 Ankle / Brachial Index (ABI) - do not check if billed separately Has the patient been seen at the hospital within the last three years: Yes Total Score: 95 Level Of Care: New/Established - Level 3 Electronic Signature(s) Signed: 03/15/2021 5:28:50 PM By: Levan Hurst RN, BSN Entered By: Levan Hurst on 03/15/2021 10:11:01 -------------------------------------------------------------------------------- Lower Extremity Assessment Details Patient Name: Date of Service: Bobby Glasgow D. 03/15/2021 9:00 A M Medical Record Number: [id number] Patient Account Number: [id number] Date of Birth/Sex: Treating RN: 1966-12-14 (55 y.o. Bobby Mcguire Primary Care Jones Viviani: Maximiano Coss Other Clinician: Referring Bhavesh Vazquez: Treating Chesney Suares/Extender: Mallie Snooks in Treatment: 0 Electronic Signature(s) Signed: 03/15/2021 5:14:32  PM By: Rhae Hammock RN Entered By: Rhae Hammock on 03/15/2021 09:39:37 -------------------------------------------------------------------------------- [place] Details Patient Name: Date of Service: Bobby Glasgow D. 03/15/2021 9:00 A M Medical Record Number: [id number] Patient Account Number: [id number] Date of Birth/Sex: Treating RN: 10/03/1966 (55 y.o. Bobby Mcguire Primary Care Kaivon Livesey: Maximiano Coss Other Clinician: Referring Tyshell Ramberg: Treating Lianni Kanaan/Extender: Mallie Snooks in Treatment: 0 Multidisciplinary Care Plan reviewed with physician Active Inactive HBO Nursing Diagnoses: Anxiety related to feelings of confinement associated with the hyperbaric oxygen chamber Anxiety related to knowledge deficit of hyperbaric oxygen therapy and treatment procedures Discomfort related to temperature and humidity changes inside hyperbaric chamber Potential for barotraumas to ears, sinuses, teeth, and lungs or cerebral gas embolism related to changes in atmospheric pressure inside hyperbaric oxygen chamber Potential for oxygen toxicity seizures related to delivery of 100% oxygen at an increased atmospheric pressure Potential for pulmonary oxygen toxicity related to delivery of 100% oxygen at an increased atmospheric pressure Goals: Barotrauma will be prevented during HBO2 Date Initiated: 03/15/2021 Target Resolution Date: 05/14/2021 Goal Status: Active Patient and/or family will be able to state/discuss factors appropriate to the management of their disease process during treatment Date Initiated: 03/15/2021 Target Resolution Date: 05/14/2021 Goal Status: Active Patient will tolerate the hyperbaric oxygen therapy treatment Date Initiated: 03/15/2021 Target Resolution Date: 05/14/2021 Goal Status: Active Patient will tolerate the internal climate of the chamber Date Initiated: 03/15/2021 Target Resolution Date: 05/14/2021 Goal  Status: Active Patient/caregiver will verbalize understanding of HBO goals, rationale, procedures and potential hazards Date Initiated: 03/15/2021 Target Resolution Date: 05/14/2021 Goal Status: Active Signs and symptoms of pulmonary oxygen toxicity will be recognized and promptly addressed Date Initiated: 03/15/2021 Target Resolution Date: 05/14/2021 Goal Status: Active Signs and symptoms of seizure will be recognized and promptly addressed ; seizing patients will suffer no harm Date Initiated: 03/15/2021 Target Resolution Date: 05/14/2021 Goal Status: Active Interventions: Administer a five (5) minute air break for patient if signs and symptoms of seizure appear and notify the hyperbaric physician Administer decongestants, per  physician orders, prior to HBO2 Administer the correct therapeutic gas delivery based on the patients needs and limitations, per physician order Assess and provide for patients comfort related to the hyperbaric environment and equalization of middle ear Assess for signs and symptoms related to adverse events, including but not limited to confinement anxiety, pneumothorax, oxygen toxicity and baurotrauma Assess patient for any history of confinement anxiety Assess patient's knowledge and expectations regarding hyperbaric medicine and provide education related to the hyperbaric environment, goals of treatment and prevention of adverse events Implement protocols to decrease risk of pneumothorax in high risk patients Notes: Electronic Signature(s) Signed: 03/15/2021 5:28:50 PM By: Levan Hurst RN, BSN Entered By: Levan Hurst on 03/15/2021 10:09:52 -------------------------------------------------------------------------------- Pain Assessment Details Patient Name: Date of Service: Bobby Glasgow D. 03/15/2021 9:00 A M Medical Record Number: [id number] Patient Account Number: [id number] Date of Birth/Sex: Treating RN: Jul 30, 1966 (55 y.o. Bobby Mcguire,  Bobby Mcguire Primary Care Ashlynne Shetterly: Maximiano Coss Other Clinician: Referring Kyrel Leighton: Treating Rondia Higginbotham/Extender: Mallie Snooks in Treatment: 0 Active Problems Location of Pain Severity and Description of Pain Patient Has Paino No Site Locations Pain Management and Medication Current Pain Management: Electronic Signature(s) Signed: 03/15/2021 5:14:32 PM By: Rhae Hammock RN Entered By: Rhae Hammock on 03/15/2021 09:39:27 -------------------------------------------------------------------------------- Patient/Caregiver Education Details Patient Name: Date of Service: Bobby Glasgow D. 3/28/2022andnbsp9:00 A M Medical Record Number: [id number] Patient Account Number: [id number] Date of Birth/Gender: Treating RN: [birth year] (55 y.o. Bobby Mcguire Primary Care Physician: Maximiano Coss Other Clinician: Referring Physician: Treating Physician/Extender: Mallie Snooks in Treatment: 0 Education Assessment Education Provided To: Patient Education Topics Provided Wound/Skin Impairment: Methods: Explain/Verbal Responses: State content correctly Electronic Signature(s) Signed: 03/15/2021 5:28:50 PM By: Levan Hurst RN, BSN Entered By: Levan Hurst on 03/15/2021 10:10:06 -------------------------------------------------------------------------------- [place] Details Patient Name: Date of Service: Bobby Glasgow D. 03/15/2021 9:00 A M Medical Record Number: [id number] Patient Account Number: [id number] Date of Birth/Sex: Treating RN: February 11, 1966 (55 y.o. Bobby Mcguire, Bobby Mcguire Primary Care Brenisha Tsui: Maximiano Coss Other Clinician: Referring Brycin Kille: Treating Fredrica Capano/Extender: Mallie Snooks in Treatment: 0 Vital Signs Time Taken: 09:32 Temperature (F): 97.7 Height (in): 75 Pulse (bpm): 74 Source: Stated Respiratory Rate (breaths/min): 17 Weight (lbs): 330 Blood Pressure (mmHg):  124/64 Source: Stated Reference Range: 80 - 120 mg / dl Body Mass Index (BMI): 41.2 Electronic Signature(s) Signed: 03/15/2021 5:14:32 PM By: Rhae Hammock RN Entered By: Rhae Hammock on 03/15/2021 09:35:41

## 2021-03-18 ENCOUNTER — Telehealth: Payer: Self-pay | Admitting: Registered Nurse

## 2021-03-18 NOTE — Telephone Encounter (Signed)
   Patient calling to request refill for rosuvastatin (CRESTOR) 20 MG tablet Bobby, Mcguire   Patient also requesting to become patient of Dr Bobby Mcguire Bobby Mcguire patient)  Patient declined to follow Bobby Mcguire to new location. Patient states "he knows for a fact Dr Bobby Mcguire will take him as a new patient".  Please call patient

## 2021-03-22 NOTE — Telephone Encounter (Signed)
Follow up message  [facility] calling to request call regarding Crestor  Phone [phone] Ref # [id number]

## 2021-03-24 NOTE — Telephone Encounter (Signed)
Hi check the in basket about the Crestor. Thanks

## 2021-03-25 ENCOUNTER — Encounter: Payer: Self-pay | Admitting: Family Medicine

## 2021-03-25 ENCOUNTER — Telehealth (INDEPENDENT_AMBULATORY_CARE_PROVIDER_SITE_OTHER): Payer: BC Managed Care – PPO | Admitting: Family Medicine

## 2021-03-25 DIAGNOSIS — J069 Acute upper respiratory infection, unspecified: Secondary | ICD-10-CM

## 2021-03-25 DIAGNOSIS — M542 Cervicalgia: Secondary | ICD-10-CM | POA: Insufficient documentation

## 2021-03-25 DIAGNOSIS — R29898 Other symptoms and signs involving the musculoskeletal system: Secondary | ICD-10-CM | POA: Insufficient documentation

## 2021-03-25 MED ORDER — PREDNISONE 10 MG PO TABS: ORAL_TABLET | ORAL | 0 refills | Status: DC

## 2021-03-25 NOTE — Telephone Encounter (Signed)
Patient authorization request is being faxed to [facility]

## 2021-03-25 NOTE — Progress Notes (Signed)
I connected with  Bobby Mcguire on 03/25/21 by a video enabled telemedicine application and verified that I am speaking with the correct person using two identifiers.   I discussed the limitations of evaluation and management by telemedicine. The patient expressed understanding and agreed to proceed.

## 2021-03-25 NOTE — Progress Notes (Signed)
Virtual Visit via Video   I connected with patient on 03/25/21 at  9:30 AM EDT by a video enabled telemedicine application and verified that I am speaking with the correct person using two identifiers.  Location patient: Home Location provider: [facility], Office Persons participating in the virtual visit: Patient, Provider, [place] (Sabrina M)  I discussed the limitations of evaluation and management by telemedicine and the availability of in person appointments. The patient expressed understanding and agreed to proceed.  Subjective:   HPI:   Cough- 'I think it's just a regular old cold'.  Pt took home COVID test 3 days ago- negative.  Feels he is 'on the tail end of it'.  Continues to have 'unproductive dry cough'.  Took some leftover Prednisone last night (40mg ) to help w/ head congestion so he could wear CPAP at night.  No relief w/ OTC Mucinex.  Pt wants a work note b/c he hasn't been able to wear the CPAP which means he hasn't been able to sleep.  sxs started ~1 week ago.  Pt missed work yesterday, today, and isn't going tomorrow or Saturday.  Next shift is scheduled for 4/13.  ROS:   See pertinent positives and negatives per HPI.  Patient Active Problem List   Diagnosis Date Noted  . Arm weakness 03/25/2021  . Neck pain 03/25/2021  . Ulnar neuropathy 10/29/2020  . Cervical radiculopathy at C8 07/09/2020  . Ganglion of flexor tendon sheath of right thumb 05/21/2020  . Saturday night paralysis of right upper extremity 05/21/2020  . Wrist arthritis 05/21/2020  . Alcoholic peripheral neuropathy ([place]) 04/08/2020  . Laryngopharyngeal reflux (LPR) 01/14/2020  . Pharyngoesophageal dysphagia 01/14/2020  . Malignant neoplasm of prostate ([place]) 08/06/2019  . Morbid (severe) obesity due to excess calories ([place]) 02/13/2019  . Osteoarthritis of knee 01/09/2018  . Erectile dysfunction 09/19/2017  . Essential hypertension 06/22/2017  . Severe obstructive sleep apnea-hypopnea  syndrome 05/07/2010    Social History   Tobacco Use  . Smoking status: Never Smoker  . Smokeless tobacco: Never Used  Substance Use Topics  . Alcohol use: Yes    Alcohol/week: 3.0 standard drinks    Types: 3 Glasses of wine per week    Comment: reports he drinks a beer on occasion    Current Outpatient Medications:  .  amLODipine (NORVASC) 10 MG tablet, TAKE 1 TABLET DAILY, Disp: 90 tablet, Rfl: 0 .  losartan (COZAAR) 100 MG tablet, TAKE 1 TABLET DAILY, Disp: 90 tablet, Rfl: 1 .  MULTIPLE VITAMINS PO, Take by mouth daily., Disp: , Rfl:  .  predniSONE (DELTASONE) 20 MG tablet, Take 20 mg by mouth 2 (two) times daily., Disp: , Rfl:  .  rosuvastatin (CRESTOR) 20 MG tablet, TAKE 1 TABLET DAILY, Disp: 90 tablet, Rfl: 3 .  sildenafil (VIAGRA) 100 MG tablet, TAKE ONE-HALF (1/2) TO ONE TABLET DAILY AS NEEDED FOR ERECTILE DYSFUNCTION, Disp: 30 tablet, Rfl: 5 .  Zinc 50 MG TABS, Take by mouth daily., Disp: , Rfl:  .  benzonatate (TESSALON) 200 MG capsule, Take 1 capsule (200 mg total) by mouth 2 (two) times daily as needed for cough., Disp: 20 capsule, Rfl: 0 .  fluticasone (FLONASE) 50 MCG/ACT nasal spray, USE 2 SPRAYS IN EACH NOSTRIL DAILY, Disp: 16 g, Rfl: 2 .  hydrochlorothiazide (HYDRODIURIL) 25 MG tablet, Take 1 tablet (25 mg total) by mouth daily., Disp: 90 tablet, Rfl: 3 .  ibuprofen (ADVIL,MOTRIN) 800 MG tablet, ibuprofen 800 mg tablet  TAKE 2 TABLETS TWICE  A DAY, Disp: , Rfl:  .  meloxicam (MOBIC) 15 MG tablet, Take 1 tablet (15 mg total) by mouth daily. (Patient not taking: Reported on 06/24/2020), Disp: 30 tablet, Rfl: 0 .  methocarbamol (ROBAXIN) 500 MG tablet, Take 1 tablet (500 mg total) by mouth 4 (four) times daily. (Patient not taking: Reported on 06/24/2020), Disp: 60 tablet, Rfl: 0 .  Olopatadine HCl 0.6 % SOLN, Place 1 spray into the nose in the morning and at bedtime. (Patient not taking: No sig reported), Disp: 30.5 g, Rfl: 5 .  traZODone (DESYREL) 50 MG tablet, Take 0.5-1  tablets (25-50 mg total) by mouth at bedtime as needed for sleep., Disp: 90 tablet, Rfl: 3  Allergies  Allergen Reactions  . No Known Allergies     Objective:   There were no vitals taken for this visit.  AAOx3, NAD NCAT, EOMI No obvious CN deficits Coloring WNL Pt is able to speak clearly, coherently without shortness of breath or increased work of breathing.  Thought process is linear.  Mood is appropriate.   Assessment and Plan:   Viral URI- new.  Already improving but pt has persistent cough.  Will do short course prednisone taper to improve airway inflammation and quiet cough.  Note provided for work.  Reviewed supportive care and red flags that should prompt return.  Pt expressed understanding and is in agreement w/ plan.   Annye Asa, MD 03/25/2021

## 2021-03-29 ENCOUNTER — Encounter: Payer: Self-pay | Admitting: Registered Nurse

## 2021-03-29 ENCOUNTER — Other Ambulatory Visit: Payer: Self-pay

## 2021-03-29 DIAGNOSIS — E785 Hyperlipidemia, unspecified: Secondary | ICD-10-CM

## 2021-03-29 MED ORDER — ROSUVASTATIN CALCIUM 20 MG PO TABS: 20.0000 mg | ORAL_TABLET | Freq: Every day | ORAL | 1 refills | Status: DC

## 2021-03-30 ENCOUNTER — Telehealth: Payer: Self-pay | Admitting: Registered Nurse

## 2021-03-30 NOTE — Telephone Encounter (Signed)
Disability form placed in Richard's bin up front.

## 2021-03-31 NOTE — Telephone Encounter (Signed)
Will write patient work note and send it to patients my chart.

## 2021-03-31 NOTE — Telephone Encounter (Signed)
Called patient to see what dates his note needed to say and he stated he lost hope on the mychart message to just fill out the disability form. I have filled out partial information waiting for Orland Mustard to complete and I will fax to his HR.

## 2021-04-05 ENCOUNTER — Other Ambulatory Visit: Payer: Self-pay | Admitting: Registered Nurse

## 2021-04-05 DIAGNOSIS — I10 Essential (primary) hypertension: Secondary | ICD-10-CM

## 2021-04-09 ENCOUNTER — Other Ambulatory Visit: Payer: BC Managed Care – PPO

## 2021-04-09 ENCOUNTER — Other Ambulatory Visit: Payer: Self-pay

## 2021-04-09 ENCOUNTER — Encounter: Payer: Self-pay | Admitting: Family Medicine

## 2021-04-09 DIAGNOSIS — Z0184 Encounter for antibody response examination: Secondary | ICD-10-CM | POA: Diagnosis not present

## 2021-04-09 NOTE — Telephone Encounter (Signed)
Patient is coming in for a lab visit for Covid-19 antibody.

## 2021-04-09 NOTE — Addendum Note (Signed)
Addended by: Veneda Melter on: 04/09/2021 10:20 AM   Modules accepted: Orders

## 2021-04-10 ENCOUNTER — Encounter: Payer: Self-pay | Admitting: Registered Nurse

## 2021-04-10 LAB — SARS-COV-2 ANTIBODIES: SARS-CoV-2 Antibodies: POSITIVE

## 2021-04-12 ENCOUNTER — Encounter: Payer: Self-pay | Admitting: Registered Nurse

## 2021-04-12 ENCOUNTER — Telehealth: Payer: Self-pay

## 2021-04-12 NOTE — Telephone Encounter (Signed)
Patient was covid positive while he was out and would like a note.

## 2021-04-12 NOTE — Telephone Encounter (Signed)
Patient is requesting a call back to review recent COVID test result.

## 2021-04-12 NOTE — Telephone Encounter (Signed)
Bobby Mcguire is currently writing his letter for Covid right now.

## 2021-04-15 DIAGNOSIS — G4733 Obstructive sleep apnea (adult) (pediatric): Secondary | ICD-10-CM | POA: Diagnosis not present

## 2021-05-19 IMAGING — DX CHEST - 2 VIEW
3 series · 3 of 3 positions shown · non-contrast
Comparison: Chest radiographs 08/05/2018.

CLINICAL DATA: Upper back pain.

EXAM:
CHEST - 2 VIEW

[chest pa (1 of 2)]
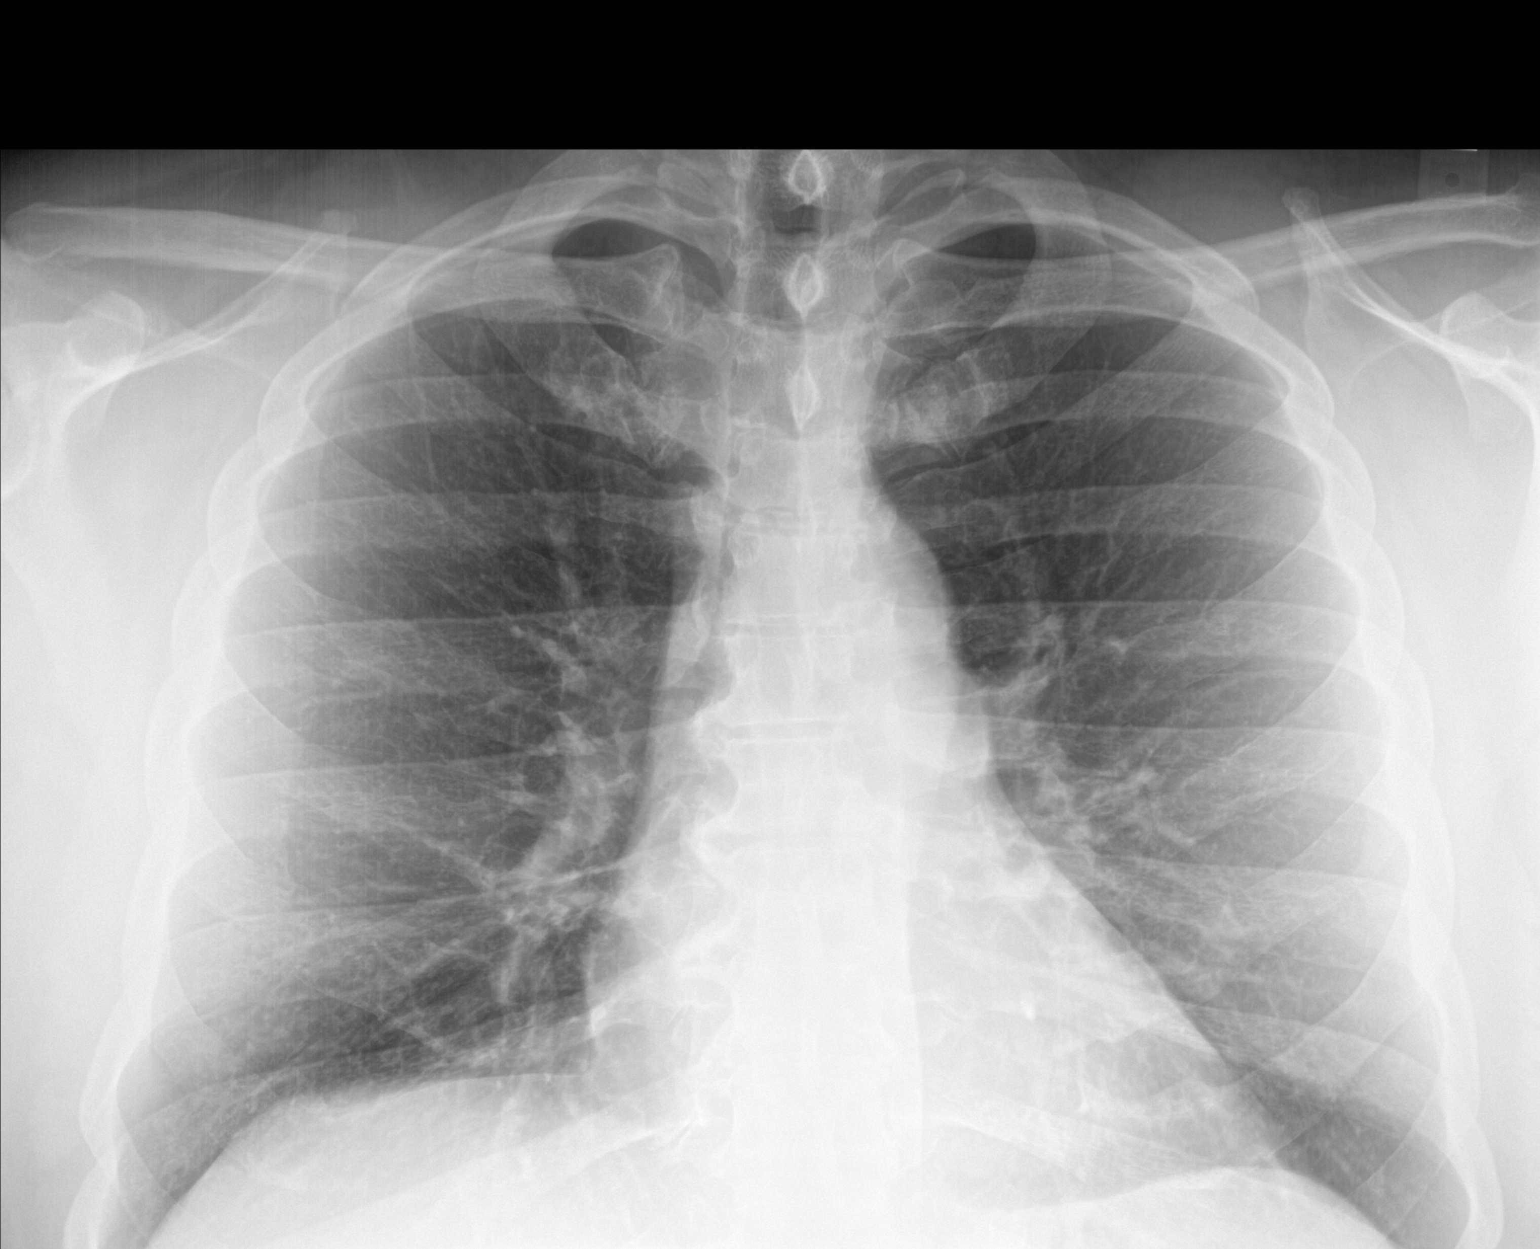

[chest pa (2 of 2)]
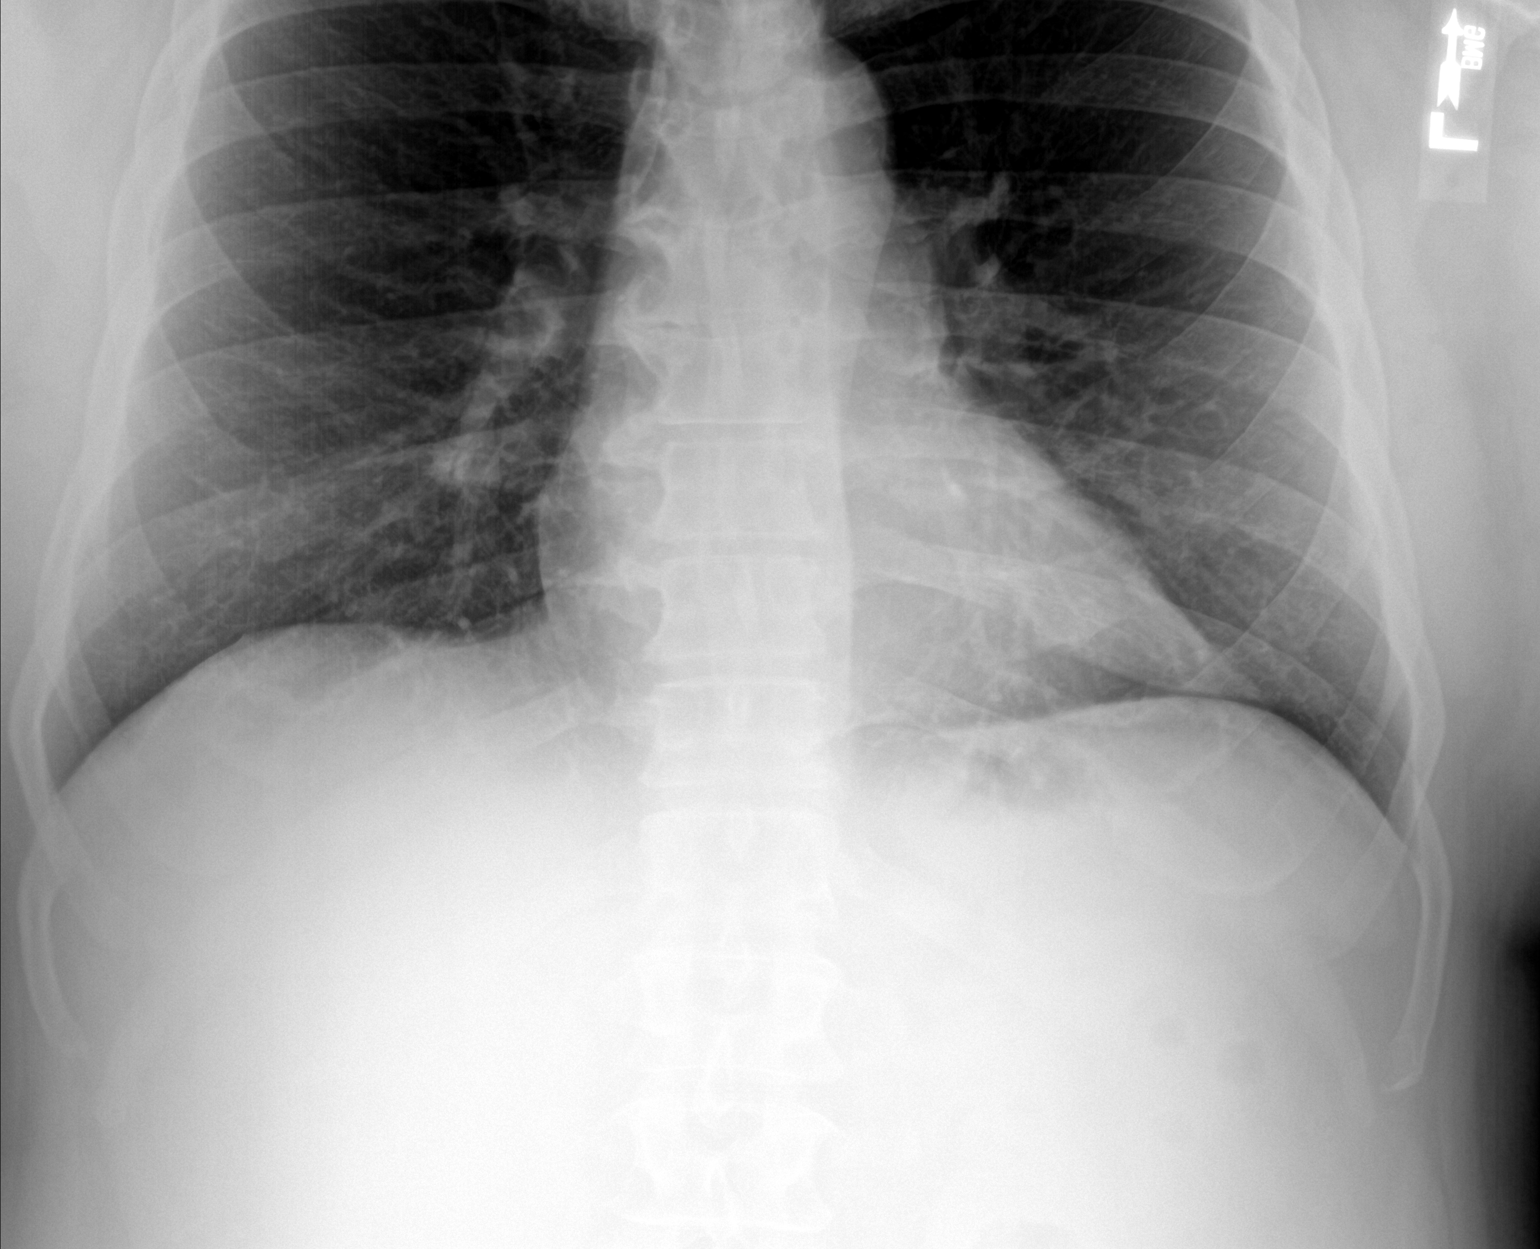

[chest lat]
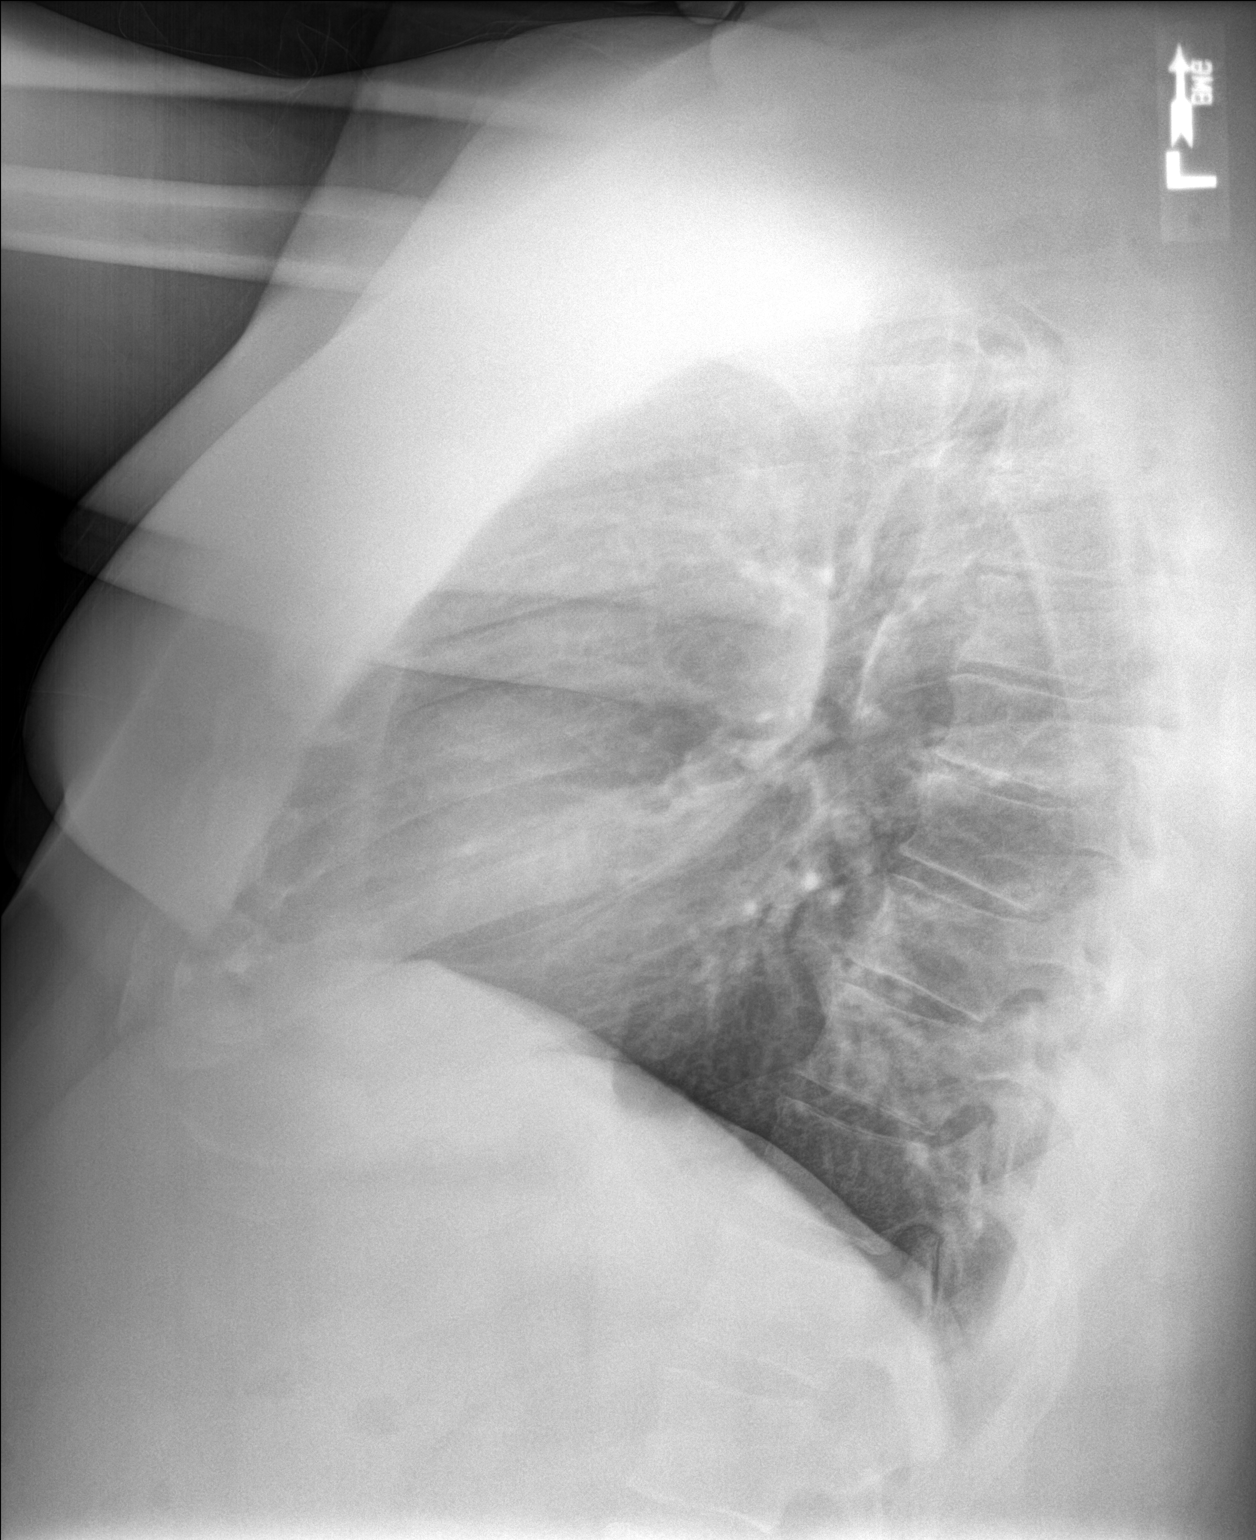

[3 of 3 positions shown; findings below may reference images not displayed]

FINDINGS: Three views obtained. The heart size and mediastinal contours are
normal. The lungs are clear. There is no pleural effusion or
pneumothorax. No acute osseous findings are identified. Stable
thoracic spine degenerative changes.
IMPRESSION: Stable chest.  No evidence of active cardiopulmonary process.

## 2021-06-01 ENCOUNTER — Encounter: Payer: Self-pay | Admitting: Registered Nurse

## 2021-06-04 ENCOUNTER — Ambulatory Visit: Payer: BC Managed Care – PPO | Admitting: Registered Nurse

## 2021-06-04 ENCOUNTER — Other Ambulatory Visit: Payer: Self-pay

## 2021-06-04 ENCOUNTER — Encounter: Payer: Self-pay | Admitting: Registered Nurse

## 2021-06-04 VITALS — BP 132/76 | HR 89 | Temp 98.2°F | Ht 75.0 in | Wt 349.2 lb

## 2021-06-04 DIAGNOSIS — N644 Mastodynia: Secondary | ICD-10-CM | POA: Diagnosis not present

## 2021-06-04 NOTE — Patient Instructions (Signed)
Mr. Deal -   Low suspicion for anything beyond some dense tissue, maybe inflammation in a muscle, maybe a cyst.  Will determine this with ultrasound - hoping to get this done Monday or Tuesday, if not next week then the week after.  Red flags for urgent concerns can be found at [email or web address] - this reviews common red flags for breast cancer.  I'll be in touch with results  Thank you  Rich

## 2021-06-04 NOTE — Progress Notes (Signed)
Acute Office Visit  Subjective:    Patient ID: Bobby Mcguire, male    DOB: 1966/07/30, 55 y.o.   MRN: [id number]  No chief complaint on file.   HPI Patient is in today for superficial chest pain  R side Ongoing around 1 mo Superficial. Lateral nipple/areola area No discernable mass No crusting or drainage Notes he has had cysts/lesions in the past that would produce purulent drainage - but it has been years No local adenopathy No systemic symptoms  Past Medical History:  Diagnosis Date   Hypertension    Prostate cancer ([place])    Prostate cancer ([place])    Sleep apnea     Past Surgical History:  Procedure Laterality Date   PROSTATE BIOPSY      Family History  Problem Relation Age of Onset   Colon cancer Mother    Breast cancer Neg Hx    Prostate cancer Neg Hx     Social History   Socioeconomic History   Marital status: Married    Spouse name: Not on file   Number of children: 4   Years of education: Not on file   Highest education level: Not on file  Occupational History   Not on file  Tobacco Use   Smoking status: Never   Smokeless tobacco: Never  Vaping Use   Vaping Use: Never used  Substance and Sexual Activity   Alcohol use: Yes    Alcohol/week: 3.0 standard drinks    Types: 3 Glasses of wine per week    Comment: reports he drinks a beer on occasion   Drug use: No   Sexual activity: Yes  Other Topics Concern   Not on file  Social History Narrative   3 biological and 1 stepchild   Social Determinants of Health   Financial Resource Strain: Not on file  Food Insecurity: Not on file  Transportation Needs: Not on file  Physical Activity: Not on file  Stress: Not on file  Social Connections: Not on file  Intimate Partner Violence: Not on file    Outpatient Medications Prior to Visit  Medication Sig Dispense Refill   amLODipine (NORVASC) 10 MG tablet TAKE 1 TABLET DAILY 90 tablet 3   losartan (COZAAR) 100 MG tablet TAKE 1 TABLET DAILY 90  tablet 1   MULTIPLE VITAMINS PO Take by mouth daily.     Olopatadine HCl 0.6 % SOLN Place 1 spray into the nose in the morning and at bedtime. 30.5 g 5   predniSONE (DELTASONE) 10 MG tablet 3 tabs x3 days and then 2 tabs x3 days and then 1 tab x3 days.  Take w/ food. 18 tablet 0   rosuvastatin (CRESTOR) 20 MG tablet Take 1 tablet (20 mg total) by mouth daily. 90 tablet 1   sildenafil (VIAGRA) 100 MG tablet TAKE ONE-HALF (1/2) TO ONE TABLET DAILY AS NEEDED FOR ERECTILE DYSFUNCTION 30 tablet 5   traZODone (DESYREL) 50 MG tablet Take 0.5-1 tablets (25-50 mg total) by mouth at bedtime as needed for sleep. 90 tablet 3   Zinc 50 MG TABS Take by mouth daily.     benzonatate (TESSALON) 200 MG capsule Take 1 capsule (200 mg total) by mouth 2 (two) times daily as needed for cough. 20 capsule 0   fluticasone (FLONASE) 50 MCG/ACT nasal spray USE 2 SPRAYS IN EACH NOSTRIL DAILY 16 g 2   hydrochlorothiazide (HYDRODIURIL) 25 MG tablet Take 1 tablet (25 mg total) by mouth daily. 90 tablet 3  ibuprofen (ADVIL,MOTRIN) 800 MG tablet ibuprofen 800 mg tablet  TAKE 2 TABLETS TWICE A DAY     meloxicam (MOBIC) 15 MG tablet Take 1 tablet (15 mg total) by mouth daily. (Patient not taking: Reported on 06/24/2020) 30 tablet 0   methocarbamol (ROBAXIN) 500 MG tablet Take 1 tablet (500 mg total) by mouth 4 (four) times daily. (Patient not taking: Reported on 06/24/2020) 60 tablet 0   No facility-administered medications prior to visit.    Allergies  Allergen Reactions   No Known Allergies     Review of Systems  Constitutional: Negative.   HENT: Negative.    Eyes: Negative.   Respiratory: Negative.    Cardiovascular: Negative.   Gastrointestinal: Negative.   Genitourinary: Negative.   Musculoskeletal: Negative.   Skin: Negative.   Neurological: Negative.   Psychiatric/Behavioral: Negative.    All other systems reviewed and are negative.     Objective:    Physical Exam Vitals and nursing note reviewed.   Constitutional:      Appearance: Normal appearance.  Cardiovascular:     Rate and Rhythm: Normal rate and regular rhythm.     Pulses: Normal pulses.     Heart sounds: Normal heart sounds. No murmur heard.   No friction rub. No gallop.  Pulmonary:     Effort: Pulmonary effort is normal. No respiratory distress.     Breath sounds: Normal breath sounds. No stridor. No wheezing, rhonchi or rales.  Chest:     Chest wall: Tenderness present. No swelling.  Breasts:    Breasts are symmetrical.    Neurological:     General: No focal deficit present.     Mental Status: He is alert. Mental status is at baseline.  Psychiatric:        Mood and Affect: Mood normal.        Behavior: Behavior normal.        Thought Content: Thought content normal.        Judgment: Judgment normal.    BP 132/76   Pulse 89   Temp 98.2 F (36.8 C)   Ht 6\' 3"  (1.905 m)   Wt (!) 349 lb 3.2 oz (158.4 kg)   SpO2 97%   BMI 43.65 kg/m  Wt Readings from Last 3 Encounters:  06/04/21 (!) 349 lb 3.2 oz (158.4 kg)  11/19/20 (!) 340 lb (154.2 kg)  09/23/20 (!) 340 lb (154.2 kg)    Health Maintenance Due  Topic Date Due   Pneumococcal Vaccine 4-36 Years old (1 - PCV) Never done   COVID-19 Vaccine (4 - Booster for Pfizer series) 01/06/2021    There are no preventive care reminders to display for this patient.   Lab Results  Component Value Date   TSH 0.852 01/07/2020   Lab Results  Component Value Date   WBC 9.3 05/23/2019   HGB 14.2 05/23/2019   HCT 42.9 05/23/2019   MCV 99 (H) 05/23/2019   PLT 331 05/23/2019   Lab Results  Component Value Date   NA 141 06/24/2020   K 3.9 06/24/2020   CO2 23 06/24/2020   GLUCOSE 99 06/24/2020   BUN 10 06/24/2020   CREATININE 0.76 06/24/2020   BILITOT 0.5 02/19/2020   ALKPHOS 78 02/19/2020   AST 15 02/19/2020   ALT 28 02/19/2020   PROT 7.2 02/19/2020   ALBUMIN 4.6 02/19/2020   CALCIUM 9.6 06/24/2020   ANIONGAP 11 08/05/2018   GFR 89.54 01/07/2019    Lab Results  Component Value Date  CHOL 132 02/19/2020   Lab Results  Component Value Date   HDL 41 02/19/2020   Lab Results  Component Value Date   LDLCALC 77 02/19/2020   Lab Results  Component Value Date   TRIG 66 02/19/2020   Lab Results  Component Value Date   CHOLHDL 3.2 02/19/2020   Lab Results  Component Value Date   HGBA1C 5.0 11/05/2018       Assessment & Plan:   Problem List Items Addressed This Visit   None Visit Diagnoses     Breast pain, right    -  Primary   Relevant Orders   US BREAST COMPLETE UNI RIGHT INC AXILLA        No orders of the defined types were placed in this encounter.  PLAN Low suspicion for breast ca but would prefer to get [place] to rule out OTC analgesics as needed for pain Return if worsening or failing to improve Patient encouraged to call clinic with any questions, comments, or concerns.  Maximiano Coss, NP

## 2021-06-09 ENCOUNTER — Other Ambulatory Visit: Payer: Self-pay | Admitting: Registered Nurse

## 2021-06-09 DIAGNOSIS — N644 Mastodynia: Secondary | ICD-10-CM

## 2021-06-13 ENCOUNTER — Encounter: Payer: Self-pay | Admitting: Registered Nurse

## 2021-06-16 ENCOUNTER — Other Ambulatory Visit: Payer: Self-pay | Admitting: Emergency Medicine

## 2021-06-16 DIAGNOSIS — N529 Male erectile dysfunction, unspecified: Secondary | ICD-10-CM

## 2021-06-25 ENCOUNTER — Other Ambulatory Visit: Payer: Self-pay | Admitting: Registered Nurse

## 2021-07-15 DIAGNOSIS — C61 Malignant neoplasm of prostate: Secondary | ICD-10-CM | POA: Diagnosis not present

## 2021-07-15 DIAGNOSIS — G4733 Obstructive sleep apnea (adult) (pediatric): Secondary | ICD-10-CM | POA: Diagnosis not present

## 2021-07-15 DIAGNOSIS — N5201 Erectile dysfunction due to arterial insufficiency: Secondary | ICD-10-CM | POA: Diagnosis not present

## 2021-07-22 DIAGNOSIS — N3041 Irradiation cystitis with hematuria: Secondary | ICD-10-CM | POA: Diagnosis not present

## 2021-07-22 DIAGNOSIS — N5201 Erectile dysfunction due to arterial insufficiency: Secondary | ICD-10-CM | POA: Diagnosis not present

## 2021-07-22 DIAGNOSIS — C61 Malignant neoplasm of prostate: Secondary | ICD-10-CM | POA: Diagnosis not present

## 2021-07-27 ENCOUNTER — Ambulatory Visit
Admission: RE | Admit: 2021-07-27 | Discharge: 2021-07-27 | Disposition: A | Discharge status: Home / Self Care | Payer: BC Managed Care – PPO | Source: Ambulatory Visit | Attending: Registered Nurse | Admitting: Registered Nurse

## 2021-07-27 ENCOUNTER — Other Ambulatory Visit: Payer: Self-pay

## 2021-07-27 DIAGNOSIS — N62 Hypertrophy of breast: Secondary | ICD-10-CM | POA: Diagnosis not present

## 2021-07-27 DIAGNOSIS — N644 Mastodynia: Secondary | ICD-10-CM

## 2021-07-27 DIAGNOSIS — R922 Inconclusive mammogram: Secondary | ICD-10-CM | POA: Diagnosis not present

## 2021-07-29 ENCOUNTER — Other Ambulatory Visit: Payer: Self-pay | Admitting: Registered Nurse

## 2021-07-29 DIAGNOSIS — R0981 Nasal congestion: Secondary | ICD-10-CM

## 2021-08-18 ENCOUNTER — Ambulatory Visit: Payer: BC Managed Care – PPO | Admitting: Orthopaedic Surgery

## 2021-08-22 ENCOUNTER — Encounter: Payer: Self-pay | Admitting: Registered Nurse

## 2021-09-13 ENCOUNTER — Other Ambulatory Visit: Payer: Self-pay | Admitting: Registered Nurse

## 2021-09-13 DIAGNOSIS — E785 Hyperlipidemia, unspecified: Secondary | ICD-10-CM

## 2021-09-20 ENCOUNTER — Encounter: Payer: Self-pay | Admitting: Orthopaedic Surgery

## 2021-09-20 NOTE — Telephone Encounter (Signed)
Please call pt and have him come in at your convenience to be fitted for a brace-given his BMI .I suspect he might be better being evaluated at [facility] me to give him a prescription tomorrow

## 2021-09-21 NOTE — Telephone Encounter (Signed)
completed

## 2021-10-01 DIAGNOSIS — M150 Primary generalized (osteo)arthritis: Secondary | ICD-10-CM | POA: Diagnosis not present

## 2021-10-14 DIAGNOSIS — G4733 Obstructive sleep apnea (adult) (pediatric): Secondary | ICD-10-CM | POA: Diagnosis not present

## 2021-10-19 ENCOUNTER — Encounter: Payer: Self-pay | Admitting: Orthopaedic Surgery

## 2021-10-19 ENCOUNTER — Ambulatory Visit (INDEPENDENT_AMBULATORY_CARE_PROVIDER_SITE_OTHER): Payer: Self-pay | Admitting: Orthopaedic Surgery

## 2021-10-19 ENCOUNTER — Ambulatory Visit: Payer: Self-pay

## 2021-10-19 DIAGNOSIS — M25561 Pain in right knee: Secondary | ICD-10-CM

## 2021-10-19 DIAGNOSIS — G8929 Other chronic pain: Secondary | ICD-10-CM

## 2021-10-19 DIAGNOSIS — M1711 Unilateral primary osteoarthritis, right knee: Secondary | ICD-10-CM

## 2021-10-19 MED ORDER — METHYLPREDNISOLONE ACETATE 40 MG/ML IJ SUSP
80.0000 mg | INTRAMUSCULAR | Status: AC | PRN
Start: 2021-10-19 — End: 2021-10-19
  Administered 2021-10-19: 80 mg via INTRA_ARTICULAR

## 2021-10-19 MED ORDER — BUPIVACAINE HCL 0.25 % IJ SOLN
2.0000 mL | INTRAMUSCULAR | Status: AC | PRN
  Administered 2021-10-19: 2 mL via INTRA_ARTICULAR

## 2021-10-19 MED ORDER — LIDOCAINE HCL 1 % IJ SOLN
2.0000 mL | INTRAMUSCULAR | Status: AC | PRN
Start: 2021-10-19 — End: 2021-10-19
  Administered 2021-10-19: 2 mL

## 2021-10-19 NOTE — Progress Notes (Signed)
Office Visit Note   Patient: Bobby Mcguire           Date of Birth: 1966-05-29           MRN: [id number] Visit Date: 10/19/2021              Requested by: Maximiano Coss, NP [address] [place],  [place] [address] PCP: Maximiano Coss, NP   Assessment & Plan: Visit Diagnoses:  1. Chronic pain of right knee   2. Primary osteoarthritis of right knee     Plan: Bobby Mcguire has been seen on a number of occasions in the past for evaluation of bilateral knee osteoarthritis.  Presently he is having trouble with the right knee with a large effusion.  Repeat x-rays demonstrated end-stage osteoarthritis predominantly in the medial compartment where there was bone-on-bone with large peripheral osteophytes, subchondral sclerosis and subchondral cysts.  I aspirated 150 cc of clear fluid and injected cortisone.  He did feel better.  Does wear a hinged brace which gives him a lot of support.  We have again discussed weight loss and total knee replacement over time.  Follow-Up Instructions: Return if symptoms worsen or fail to improve.   Orders:  Orders Placed This Encounter  Procedures   XR KNEE 3 VIEW RIGHT   No orders of the defined types were placed in this encounter.     Procedures: Large Joint Inj: R knee on 10/19/2021 1:46 PM Indications: pain and diagnostic evaluation Details: 25 G 1.5 in needle, anteromedial approach  Arthrogram: No  Medications: 2 mL lidocaine 1 %; 80 mg methylPREDNISolone acetate 40 MG/ML; 2 mL bupivacaine 0.25 % Aspirate: 150 mL clear Outcome: tolerated well, no immediate complications Procedure, treatment alternatives, risks and benefits explained, specific risks discussed. Consent was given by the patient. Immediately prior to procedure a time out was called to verify the correct patient, procedure, equipment, support staff and site/side marked as required. Patient was prepped and draped in the usual sterile fashion.      Clinical Data: No  additional findings.   Subjective: Chief Complaint  Patient presents with   Right Knee - Pain  Patient presents today for right knee pain. He said that he has had problems with this knee in the past and was told it was bone on bone. He does not want to talk about surgery. His knee flared up again 5 days ago. No known injury. He said that he is having "extreme pain". Most of his his pain is lateral and swollen. He is not taking anything for pain because over the counter medicine does not help.   HPI  Review of Systems   Objective: Vital Signs: There were no vitals taken for this visit.  Physical Exam Constitutional:      Appearance: He is well-developed.  Eyes:     Pupils: Pupils are equal, round, and reactive to light.  Pulmonary:     Effort: Pulmonary effort is normal.  Skin:    General: Skin is warm and dry.  Neurological:     Mental Status: He is alert and oriented to person, place, and time.  Psychiatric:        Behavior: Behavior normal.    Ortho Exam awake alert and oriented x3.  Comfortable sitting.  Did walk with a limp referable to the right knee.  Large effusion.  Having both medial and lateral joint pain.  Limited flexion extension based on the size of the effusion.  No popliteal  pain.  Might have a small popliteal cyst.  No calf pain.  Straight leg raise negative.  No pain with range of motion of right hip  Specialty Comments:  No specialty comments available.  Imaging: XR KNEE 3 VIEW RIGHT  Result Date: 10/19/2021 Films of the right knee obtained in 3 projections standing.  End-stage osteoarthritis is identified predominantly in the medial compartment where there is subchondral cyst, subchondral sclerosis and peripheral osteophytes.  Approximately 9 to 10 degrees of varus.  Little if any joint space remaining.  Laterally there were large peripheral osteophytes.  Also noted significant changes of the patellofemoral joint with narrowing both medially and laterally  with peripheral osteophytes.  No acute changes or ectopic calcification    PMFS History: Patient Active Problem List   Diagnosis Date Noted   Arm weakness 03/25/2021   Neck pain 03/25/2021   Ulnar neuropathy 10/29/2020   Cervical radiculopathy at C8 07/09/2020   Ganglion of flexor tendon sheath of right thumb 05/21/2020   Saturday night paralysis of right upper extremity 05/21/2020   Wrist arthritis 09/10/3006   Alcoholic peripheral neuropathy ([place]) 04/08/2020   Laryngopharyngeal reflux (LPR) 01/14/2020   Pharyngoesophageal dysphagia 01/14/2020   Malignant neoplasm of prostate ([place]) 08/06/2019   Morbid (severe) obesity due to excess calories ([place]) 02/13/2019   Osteoarthritis of knee 01/09/2018   Erectile dysfunction 09/19/2017   Essential hypertension 06/22/2017   Severe obstructive sleep apnea-hypopnea syndrome 05/07/2010   Past Medical History:  Diagnosis Date   Hypertension    Prostate cancer ([place])    Prostate cancer ([place])    Sleep apnea     Family History  Problem Relation Age of Onset   Colon cancer Mother    Breast cancer Neg Hx    Prostate cancer Neg Hx     Past Surgical History:  Procedure Laterality Date   PROSTATE BIOPSY     Social History   Occupational History   Not on file  Tobacco Use   Smoking status: Never   Smokeless tobacco: Never  Vaping Use   Vaping Use: Never used  Substance and Sexual Activity   Alcohol use: Yes    Alcohol/week: 3.0 standard drinks    Types: 3 Glasses of wine per week    Comment: reports he drinks a beer on occasion   Drug use: No   Sexual activity: Yes

## 2021-11-01 ENCOUNTER — Encounter: Payer: Self-pay | Admitting: Orthopaedic Surgery

## 2021-11-01 ENCOUNTER — Encounter: Payer: Self-pay | Admitting: Emergency Medicine

## 2021-11-01 NOTE — Telephone Encounter (Signed)
Responded to questions

## 2021-11-02 ENCOUNTER — Ambulatory Visit (INDEPENDENT_AMBULATORY_CARE_PROVIDER_SITE_OTHER): Payer: 59 | Admitting: Registered Nurse

## 2021-11-02 DIAGNOSIS — Z23 Encounter for immunization: Secondary | ICD-10-CM | POA: Diagnosis not present

## 2021-11-02 NOTE — Progress Notes (Signed)
Bobby Mcguire is a 55 y.o. male presents to the office today for pneumococcal 20 (prevnar20) injections, per physician's orders.   [name]

## 2021-11-02 NOTE — Telephone Encounter (Signed)
noted 

## 2021-11-04 ENCOUNTER — Ambulatory Visit: Payer: 59 | Admitting: Orthopaedic Surgery

## 2021-11-09 ENCOUNTER — Other Ambulatory Visit: Payer: Self-pay

## 2021-11-09 ENCOUNTER — Encounter: Payer: Self-pay | Admitting: Orthopaedic Surgery

## 2021-11-09 ENCOUNTER — Ambulatory Visit (INDEPENDENT_AMBULATORY_CARE_PROVIDER_SITE_OTHER): Payer: 59 | Admitting: Orthopaedic Surgery

## 2021-11-09 VITALS — Ht 75.0 in | Wt 356.0 lb

## 2021-11-09 DIAGNOSIS — M170 Bilateral primary osteoarthritis of knee: Secondary | ICD-10-CM

## 2021-11-09 NOTE — Progress Notes (Signed)
Office Visit Note   Patient: Bobby Mcguire           Date of Birth: August 21, 1966           MRN: [id number] Visit Date: 11/09/2021              Requested by: Maximiano Coss, NP [address] [place],  [place] [address] PCP: Maximiano Coss, NP   Assessment & Plan: Visit Diagnoses: No diagnosis found.  Plan: Patient presents in follow-up today to discuss his right knee.  He has a history of right knee arthritis.  He did have aspiration of clear yellow fluid a couple weeks ago.  It did help him a little bit.  He has had the return of some of the fluid but not nearly as much. He has been using CBD oil which he finds helpful  Also here to talk about timing of knee replacement.  His current weight is 356 pounds and he is 6 foot 3.  That places his BMI at 44.5. Discussed that he would need to have a BMI of 38 which equates a weight of 300 lbs. He understands there is a much greater risk of complications blood clots and infection at a higher. He does not have a large effusion today and elected not to aspirate at this time.  Follow-Up Instructions: No follow-ups on file.   Orders:  No orders of the defined types were placed in this encounter.  No orders of the defined types were placed in this encounter.     Procedures: No procedures performed   Clinical Data: No additional findings.   Subjective: Chief Complaint  Patient presents with   Right Knee - Pain  Patient is here today to discuss options for his chronic right knee pain.    Review of Systems   Objective: Vital Signs: There were no vitals taken for this visit.  Physical Exam  Ortho Exam Right Knee mild effusion no cellulitis crepitus with range of motion Specialty Comments:  No specialty comments available.  Imaging: No results found.   PMFS History: Patient Active Problem List   Diagnosis Date Noted   Arm weakness 03/25/2021   Neck pain 03/25/2021   Ulnar neuropathy 10/29/2020   Cervical  radiculopathy at C8 07/09/2020   Ganglion of flexor tendon sheath of right thumb 05/21/2020   Saturday night paralysis of right upper extremity 05/21/2020   Wrist arthritis 89/21/1941   Alcoholic peripheral neuropathy ([place]) 04/08/2020   Laryngopharyngeal reflux (LPR) 01/14/2020   Pharyngoesophageal dysphagia 01/14/2020   Malignant neoplasm of prostate ([place]) 08/06/2019   Morbid (severe) obesity due to excess calories ([place]) 02/13/2019   Osteoarthritis of knee 01/09/2018   Erectile dysfunction 09/19/2017   Essential hypertension 06/22/2017   Severe obstructive sleep apnea-hypopnea syndrome 05/07/2010   Past Medical History:  Diagnosis Date   Hypertension    Prostate cancer ([place])    Prostate cancer ([place])    Sleep apnea     Family History  Problem Relation Age of Onset   Colon cancer Mother    Breast cancer Neg Hx    Prostate cancer Neg Hx     Past Surgical History:  Procedure Laterality Date   PROSTATE BIOPSY     Social History   Occupational History   Not on file  Tobacco Use   Smoking status: Never   Smokeless tobacco: Never  Vaping Use   Vaping Use: Never used  Substance and Sexual Activity   Alcohol use: Yes  Alcohol/week: 3.0 standard drinks    Types: 3 Glasses of wine per week    Comment: reports he drinks a beer on occasion   Drug use: No   Sexual activity: Yes

## 2021-11-14 DIAGNOSIS — G4733 Obstructive sleep apnea (adult) (pediatric): Secondary | ICD-10-CM | POA: Diagnosis not present

## 2021-12-07 ENCOUNTER — Other Ambulatory Visit: Payer: Self-pay | Admitting: Registered Nurse

## 2021-12-07 ENCOUNTER — Telehealth: Payer: Self-pay | Admitting: Registered Nurse

## 2021-12-07 DIAGNOSIS — N529 Male erectile dysfunction, unspecified: Secondary | ICD-10-CM

## 2021-12-07 DIAGNOSIS — I10 Essential (primary) hypertension: Secondary | ICD-10-CM

## 2021-12-07 DIAGNOSIS — E785 Hyperlipidemia, unspecified: Secondary | ICD-10-CM

## 2021-12-07 MED ORDER — LOSARTAN POTASSIUM 100 MG PO TABS: 100.0000 mg | ORAL_TABLET | Freq: Every day | ORAL | 1 refills | Status: DC

## 2021-12-07 MED ORDER — SILDENAFIL CITRATE 100 MG PO TABS: 50.0000 mg | ORAL_TABLET | Freq: Every day | ORAL | 5 refills | Status: DC | PRN

## 2021-12-07 MED ORDER — ROSUVASTATIN CALCIUM 20 MG PO TABS
20.0000 mg | ORAL_TABLET | Freq: Every day | ORAL | 1 refills | Status: DC
Start: 2021-12-07 — End: 2022-01-07

## 2021-12-07 MED ORDER — AMLODIPINE BESYLATE 10 MG PO TABS: 10.0000 mg | ORAL_TABLET | Freq: Every day | ORAL | 1 refills | Status: DC

## 2021-12-07 NOTE — Telephone Encounter (Signed)
Refill sent, due for visit in 6 mo  Thanks,  Denice Paradise

## 2021-12-07 NOTE — Telephone Encounter (Signed)
Pt called in asking for new scripts to  be sent to Losartan, Rosuvastatin, Amlodipine, and prednisone 20 mg. Pt no longer uses [facility] and is going to go back to using [facility] on [address]. He states that he isn't out of the medication but doesn't want to run out.   Please advise

## 2021-12-13 ENCOUNTER — Other Ambulatory Visit: Payer: Self-pay | Admitting: Registered Nurse

## 2021-12-13 DIAGNOSIS — E785 Hyperlipidemia, unspecified: Secondary | ICD-10-CM

## 2021-12-14 DIAGNOSIS — G4733 Obstructive sleep apnea (adult) (pediatric): Secondary | ICD-10-CM | POA: Diagnosis not present

## 2022-01-05 ENCOUNTER — Encounter: Payer: 59 | Admitting: Registered Nurse

## 2022-01-07 ENCOUNTER — Ambulatory Visit (INDEPENDENT_AMBULATORY_CARE_PROVIDER_SITE_OTHER): Payer: 59 | Admitting: Registered Nurse

## 2022-01-07 ENCOUNTER — Encounter: Payer: Self-pay | Admitting: Registered Nurse

## 2022-01-07 VITALS — BP 130/70 | HR 84 | Temp 98.3°F | Ht 75.0 in | Wt 340.4 lb

## 2022-01-07 DIAGNOSIS — Z0000 Encounter for general adult medical examination without abnormal findings: Secondary | ICD-10-CM | POA: Diagnosis not present

## 2022-01-07 DIAGNOSIS — E785 Hyperlipidemia, unspecified: Secondary | ICD-10-CM

## 2022-01-07 DIAGNOSIS — Z1329 Encounter for screening for other suspected endocrine disorder: Secondary | ICD-10-CM | POA: Diagnosis not present

## 2022-01-07 DIAGNOSIS — Z125 Encounter for screening for malignant neoplasm of prostate: Secondary | ICD-10-CM

## 2022-01-07 DIAGNOSIS — Z130 Encounter for screening for diseases of the blood and blood-forming organs and certain disorders involving the immune mechanism: Secondary | ICD-10-CM | POA: Diagnosis not present

## 2022-01-07 DIAGNOSIS — R040 Epistaxis: Secondary | ICD-10-CM | POA: Diagnosis not present

## 2022-01-07 DIAGNOSIS — I10 Essential (primary) hypertension: Secondary | ICD-10-CM

## 2022-01-07 DIAGNOSIS — Z13228 Encounter for screening for other metabolic disorders: Secondary | ICD-10-CM

## 2022-01-07 DIAGNOSIS — Z13220 Encounter for screening for lipoid disorders: Secondary | ICD-10-CM

## 2022-01-07 DIAGNOSIS — N529 Male erectile dysfunction, unspecified: Secondary | ICD-10-CM

## 2022-01-07 LAB — LIPID PANEL
Cholesterol: 114 mg/dL (ref 0–200)
HDL: 39.7 mg/dL (ref >39.00)
NonHDL: 74.79
Total CHOL/HDL Ratio: 3
Triglycerides: 304 mg/dL — ABNORMAL HIGH (ref 0.0–149.0)
VLDL: 60.8 mg/dL — ABNORMAL HIGH (ref 0.0–40.0)

## 2022-01-07 LAB — HEMOGLOBIN A1C: Hgb A1c MFr Bld: 5.3 % (ref 4.6–6.5)

## 2022-01-07 LAB — CBC WITH DIFFERENTIAL/PLATELET
Basophils Absolute: 0.1 10*3/uL (ref 0.0–0.1)
Basophils Relative: 0.7 % (ref 0.0–3.0)
Eosinophils Absolute: 0 10*3/uL (ref 0.0–0.7)
Eosinophils Relative: 0.7 % (ref 0.0–5.0)
HCT: 41.3 % (ref 39.0–52.0)
Hemoglobin: 13.4 g/dL (ref 13.0–17.0)
Lymphocytes Relative: 24.3 % (ref 12.0–46.0)
Lymphs Abs: 1.7 10*3/uL (ref 0.7–4.0)
MCHC: 32.5 g/dL (ref 30.0–36.0)
MCV: 100.4 fl — ABNORMAL HIGH (ref 78.0–100.0)
Monocytes Absolute: 0.5 10*3/uL (ref 0.1–1.0)
Monocytes Relative: 6.6 % (ref 3.0–12.0)
Neutro Abs: 4.7 10*3/uL (ref 1.4–7.7)
Neutrophils Relative %: 67.7 % (ref 43.0–77.0)
Platelets: 306 10*3/uL (ref 150.0–400.0)
RBC: 4.12 Mil/uL — ABNORMAL LOW (ref 4.22–5.81)
RDW: 12.9 % (ref 11.5–15.5)
WBC: 7 10*3/uL (ref 4.0–10.5)

## 2022-01-07 LAB — COMPREHENSIVE METABOLIC PANEL
ALT: 47 U/L (ref 0–53)
AST: 43 U/L — ABNORMAL HIGH (ref 0–37)
Albumin: 4.4 g/dL (ref 3.5–5.2)
Alkaline Phosphatase: 69 U/L (ref 39–117)
BUN: 10 mg/dL (ref 6–23)
CO2: 27 mEq/L (ref 19–32)
Calcium: 9.7 mg/dL (ref 8.4–10.5)
Chloride: 103 mEq/L (ref 96–112)
Creatinine, Ser: 0.92 mg/dL (ref 0.40–1.50)
GFR: 93.58 mL/min (ref >60.00)
Glucose, Bld: 152 mg/dL — ABNORMAL HIGH (ref 70–99)
Potassium: 3.8 mEq/L (ref 3.5–5.1)
Sodium: 139 mEq/L (ref 135–145)
Total Bilirubin: 0.6 mg/dL (ref 0.2–1.2)
Total Protein: 7.5 g/dL (ref 6.0–8.3)

## 2022-01-07 LAB — PSA: PSA: 0.66 ng/mL (ref 0.10–4.00)

## 2022-01-07 LAB — TSH: TSH: 0.93 u[IU]/mL (ref 0.35–5.50)

## 2022-01-07 LAB — LDL CHOLESTEROL, DIRECT: Direct LDL: 43 mg/dL

## 2022-01-07 MED ORDER — SILDENAFIL CITRATE 100 MG PO TABS: 50.0000 mg | ORAL_TABLET | Freq: Every day | ORAL | 5 refills | Status: AC | PRN

## 2022-01-07 MED ORDER — LOSARTAN POTASSIUM 100 MG PO TABS: 100.0000 mg | ORAL_TABLET | Freq: Every day | ORAL | 3 refills | Status: DC

## 2022-01-07 MED ORDER — OXYMETAZOLINE HCL 0.05 % NA SOLN: 1.0000 | Freq: Two times a day (BID) | NASAL | 0 refills | Status: DC

## 2022-01-07 MED ORDER — PREDNISONE 10 MG PO TABS: ORAL_TABLET | ORAL | 0 refills | Status: AC

## 2022-01-07 MED ORDER — ROSUVASTATIN CALCIUM 20 MG PO TABS: 20.0000 mg | ORAL_TABLET | Freq: Every day | ORAL | 3 refills | Status: DC

## 2022-01-07 MED ORDER — AMLODIPINE BESYLATE 10 MG PO TABS: 10.0000 mg | ORAL_TABLET | Freq: Every day | ORAL | 3 refills | Status: DC

## 2022-01-07 NOTE — Progress Notes (Signed)
Established Patient Office Visit  Subjective:  Patient ID: Bobby Mcguire, male    DOB: Nov 24, 1966  Age: 56 y.o. MRN: [id number]  CC:  Chief Complaint  Patient presents with   Annual Exam    HPI Vega D Merrow presents for CPE  Notes epistaxis Had been cleaning his nose and thinks he had cut himself Occ trickles of blood on and off x 3 weeks No agg/allev factors noted. Thinks prednisone has helped this in the past.  Hypertension: Patient Currently taking: amlodipine 10mg  losartan 100 Good effect. No AEs. Denies CV symptoms including: chest pain, shob, doe, headache, visual changes, fatigue, claudication, and dependent edema.   Previous readings and labs: BP Readings from Last 3 Encounters:  01/07/22 130/70  06/04/21 132/76  09/23/20 (!) 144/75   Lab Results  Component Value Date   CREATININE 0.76 06/24/2020     Otherwise no acute concerns. Feeling well  Colonoscopy about 2 years ago. Had 5 year recall in place.   Past Medical History:  Diagnosis Date   Hypertension    Prostate cancer ([place])    Prostate cancer ([place])    Sleep apnea     Past Surgical History:  Procedure Laterality Date   PROSTATE BIOPSY      Family History  Problem Relation Age of Onset   Colon cancer Mother    Breast cancer Neg Hx    Prostate cancer Neg Hx     Social History   Socioeconomic History   Marital status: Married    Spouse name: Not on file   Number of children: 4   Years of education: Not on file   Highest education level: Not on file  Occupational History   Not on file  Tobacco Use   Smoking status: Never   Smokeless tobacco: Never  Vaping Use   Vaping Use: Never used  Substance and Sexual Activity   Alcohol use: Yes    Alcohol/week: 3.0 standard drinks    Types: 3 Glasses of wine per week    Comment: reports he drinks a beer on occasion   Drug use: No   Sexual activity: Yes  Other Topics Concern   Not on file  Social History  Narrative   3 biological and 1 stepchild   Social Determinants of Health   Financial Resource Strain: Not on file  Food Insecurity: Not on file  Transportation Needs: Not on file  Physical Activity: Not on file  Stress: Not on file  Social Connections: Not on file  Intimate Partner Violence: Not on file    Outpatient Medications Prior to Visit  Medication Sig Dispense Refill   MULTIPLE VITAMINS PO Take by mouth daily.     Olopatadine HCl 0.6 % SOLN USE 1 SPRAY IN EACH NOSTRIL IN THE MORNING AND AT BEDTIME 30.5 g 5   amLODipine (NORVASC) 10 MG tablet Take 1 tablet (10 mg total) by mouth daily. 90 tablet 1   losartan (COZAAR) 100 MG tablet Take 1 tablet (100 mg total) by mouth daily. 90 tablet 1   predniSONE (DELTASONE) 10 MG tablet 3 tabs x3 days and then 2 tabs x3 days and then 1 tab x3 days.  Take w/ food. 18 tablet 0   rosuvastatin (CRESTOR) 20 MG tablet Take 1 tablet (20 mg total) by mouth daily. 90 tablet 1   sildenafil (VIAGRA) 100 MG tablet Take 0.5-1 tablets (50-100 mg total) by mouth daily as needed for erectile dysfunction. 30 tablet 5   No facility-administered  medications prior to visit.    Allergies  Allergen Reactions   No Known Allergies     ROS Review of Systems  Constitutional: Negative.   HENT: Negative.    Eyes: Negative.   Respiratory: Negative.    Cardiovascular: Negative.   Gastrointestinal: Negative.   Genitourinary: Negative.   Musculoskeletal: Negative.   Skin: Negative.   Neurological: Negative.   Psychiatric/Behavioral: Negative.    All other systems reviewed and are negative.    Objective:    Physical Exam Vitals and nursing note reviewed.  Constitutional:      General: He is not in acute distress.    Appearance: Normal appearance. He is obese. He is not ill-appearing, toxic-appearing or diaphoretic.  HENT:     Head: Normocephalic and atraumatic.     Right Ear: Tympanic membrane, ear canal and external ear normal. There is no  impacted cerumen.     Left Ear: Tympanic membrane, ear canal and external ear normal. There is no impacted cerumen.     Nose: Nose normal. No congestion or rhinorrhea.     Mouth/Throat:     Mouth: Mucous membranes are moist.     Pharynx: Oropharynx is clear. No oropharyngeal exudate or posterior oropharyngeal erythema.  Eyes:     General: No scleral icterus.       Right eye: No discharge.        Left eye: No discharge.     Extraocular Movements: Extraocular movements intact.     Conjunctiva/sclera: Conjunctivae normal.     Pupils: Pupils are equal, round, and reactive to light.  Neck:     Vascular: No carotid bruit.  Cardiovascular:     Rate and Rhythm: Normal rate and regular rhythm.     Pulses: Normal pulses.     Heart sounds: Normal heart sounds. No murmur heard.   No friction rub. No gallop.  Pulmonary:     Effort: Pulmonary effort is normal. No respiratory distress.     Breath sounds: Normal breath sounds. No stridor. No wheezing, rhonchi or rales.  Chest:     Chest wall: No tenderness.  Abdominal:     General: Abdomen is flat. Bowel sounds are normal. There is no distension.     Palpations: Abdomen is soft. There is no mass.     Tenderness: There is no abdominal tenderness. There is no right CVA tenderness, left CVA tenderness, guarding or rebound.     Hernia: No hernia is present.  Musculoskeletal:        General: No swelling, tenderness, deformity or signs of injury. Normal range of motion.     Cervical back: Normal range of motion and neck supple. No rigidity or tenderness.     Right lower leg: No edema.     Left lower leg: No edema.  Lymphadenopathy:     Cervical: No cervical adenopathy.  Skin:    General: Skin is warm and dry.     Capillary Refill: Capillary refill takes less than 2 seconds.     Coloration: Skin is not jaundiced or pale.     Findings: No bruising, erythema, lesion or rash.  Neurological:     General: No focal deficit present.     Mental Status:  He is alert and oriented to person, place, and time. Mental status is at baseline.     Cranial Nerves: No cranial nerve deficit.     Motor: No weakness.     Gait: Gait normal.  Psychiatric:  Mood and Affect: Mood normal.        Behavior: Behavior normal.        Thought Content: Thought content normal.        Judgment: Judgment normal.    BP 130/70 (BP Location: Right Arm, Patient Position: Sitting, Cuff Size: Large)    Pulse 84    Temp 98.3 F (36.8 C) (Temporal)    Ht 6\' 3"  (1.905 m)    Wt (!) 340 lb 6.4 oz (154.4 kg)    SpO2 99%    BMI 42.55 kg/m  Wt Readings from Last 3 Encounters:  01/07/22 (!) 340 lb 6.4 oz (154.4 kg)  11/09/21 (!) 356 lb (161.5 kg)  06/04/21 (!) 349 lb 3.2 oz (158.4 kg)     Health Maintenance Due  Topic Date Due   COLONOSCOPY (Pts 45-19yrs Insurance coverage will need to be confirmed)  07/19/2020   COVID-19 Vaccine (4 - Booster for [place] series) 12/01/2020    There are no preventive care reminders to display for this patient.  Lab Results  Component Value Date   TSH 0.852 01/07/2020   Lab Results  Component Value Date   WBC 9.3 05/23/2019   HGB 14.2 05/23/2019   HCT 42.9 05/23/2019   MCV 99 (H) 05/23/2019   PLT 331 05/23/2019   Lab Results  Component Value Date   NA 141 06/24/2020   K 3.9 06/24/2020   CO2 23 06/24/2020   GLUCOSE 99 06/24/2020   BUN 10 06/24/2020   CREATININE 0.76 06/24/2020   BILITOT 0.5 02/19/2020   ALKPHOS 78 02/19/2020   AST 15 02/19/2020   ALT 28 02/19/2020   PROT 7.2 02/19/2020   ALBUMIN 4.6 02/19/2020   CALCIUM 9.6 06/24/2020   ANIONGAP 11 08/05/2018   GFR 89.54 01/07/2019   Lab Results  Component Value Date   CHOL 132 02/19/2020   Lab Results  Component Value Date   HDL 41 02/19/2020   Lab Results  Component Value Date   LDLCALC 77 02/19/2020   Lab Results  Component Value Date   TRIG 66 02/19/2020   Lab Results  Component Value Date   CHOLHDL 3.2 02/19/2020   Lab Results  Component  Value Date   HGBA1C 5.0 11/05/2018      Assessment & Plan:   Problem List Items Addressed This Visit       Cardiovascular and Mediastinum   Essential hypertension   Relevant Medications   sildenafil (VIAGRA) 100 MG tablet   losartan (COZAAR) 100 MG tablet   amLODipine (NORVASC) 10 MG tablet   rosuvastatin (CRESTOR) 20 MG tablet     Other   Erectile dysfunction   Relevant Medications   sildenafil (VIAGRA) 100 MG tablet   Other Visit Diagnoses     Annual physical exam    -  Primary   Screening for endocrine, metabolic and immunity disorder       Relevant Orders   CBC with Differential/Platelet   Comprehensive metabolic panel   Hemoglobin A1c   TSH   Lipid screening       Relevant Orders   Lipid panel   Epistaxis       Relevant Medications   predniSONE (DELTASONE) 10 MG tablet   oxymetazoline (AFRIN) 0.05 % nasal spray   Screening PSA (prostate specific antigen)       Relevant Orders   PSA   Dyslipidemia       Relevant Medications   rosuvastatin (CRESTOR) 20 MG tablet  Meds ordered this encounter  Medications   predniSONE (DELTASONE) 10 MG tablet    Sig: 3 tabs x3 days and then 2 tabs x3 days and then 1 tab x3 days.  Take w/ food.    Dispense:  18 tablet    Refill:  0    Order Specific Question:   Supervising Provider    Answer:   Carlota Raspberry, JEFFREY R [[id number]]   oxymetazoline (AFRIN) 0.05 % nasal spray    Sig: Place 1 spray into both nostrils 2 (two) times daily.    Dispense:  30 mL    Refill:  0    Order Specific Question:   Supervising Provider    Answer:   Carlota Raspberry, JEFFREY R [[id number]]   sildenafil (VIAGRA) 100 MG tablet    Sig: Take 0.5-1 tablets (50-100 mg total) by mouth daily as needed for erectile dysfunction.    Dispense:  30 tablet    Refill:  5    Order Specific Question:   Supervising Provider    Answer:   Carlota Raspberry, JEFFREY R [[id number]]   losartan (COZAAR) 100 MG tablet    Sig: Take 1 tablet (100 mg total) by mouth daily.    Dispense:  90  tablet    Refill:  3    Order Specific Question:   Supervising Provider    Answer:   Carlota Raspberry, JEFFREY R [[id number]]   amLODipine (NORVASC) 10 MG tablet    Sig: Take 1 tablet (10 mg total) by mouth daily.    Dispense:  90 tablet    Refill:  3    Order Specific Question:   Supervising Provider    Answer:   Carlota Raspberry, JEFFREY R [[id number]]   rosuvastatin (CRESTOR) 20 MG tablet    Sig: Take 1 tablet (20 mg total) by mouth daily.    Dispense:  90 tablet    Refill:  3    Order Specific Question:   Supervising Provider    Answer:   Carlota Raspberry, JEFFREY R [[id number]]    Follow-up: Return in about 1 year (around 01/07/2023) for CPE and labs.   PLAN Exam unremarkable Labs collected. Will follow up with the patient as warranted. Prednisone and afrin for nosebleeds Return in 1 year for CPE and labs if today's results wnl Patient encouraged to call clinic with any questions, comments, or concerns.   Maximiano Coss, NP

## 2022-01-07 NOTE — Patient Instructions (Signed)
Mr. Coburn -  Always a pleasure.  No concerns on exam  If labs are good, see you in a year. If not, I'll let you know, and I'll see you sooner.  Call if you need anything,  Thanks,  Denice Paradise

## 2022-02-03 ENCOUNTER — Encounter: Payer: Self-pay | Admitting: Registered Nurse

## 2022-02-03 DIAGNOSIS — R0981 Nasal congestion: Secondary | ICD-10-CM

## 2022-02-03 MED ORDER — OLOPATADINE HCL 0.6 % NA SOLN: NASAL | 2 refills | Status: DC

## 2022-02-09 ENCOUNTER — Encounter: Payer: Self-pay | Admitting: Registered Nurse

## 2022-02-10 NOTE — Telephone Encounter (Signed)
Patient would like to know if you can refill his Ibuprofen 800mg  that he received from the orthopedic for the pain and swelling in the knees. Is this refill appropriate or will patient need an appointment

## 2022-03-16 ENCOUNTER — Ambulatory Visit: Payer: Self-pay | Admitting: Neurology

## 2022-03-16 ENCOUNTER — Encounter: Payer: Self-pay | Admitting: Neurology

## 2022-03-25 ENCOUNTER — Encounter: Payer: Self-pay | Admitting: Registered Nurse

## 2022-04-06 ENCOUNTER — Encounter: Payer: Self-pay | Admitting: Orthopaedic Surgery

## 2022-04-07 ENCOUNTER — Other Ambulatory Visit: Payer: Self-pay

## 2022-04-07 MED ORDER — IBUPROFEN 800 MG PO TABS: 800.0000 mg | ORAL_TABLET | Freq: Three times a day (TID) | ORAL | 3 refills | Status: AC | PRN

## 2022-04-07 NOTE — Telephone Encounter (Signed)
Patient medication was sent to the pharmacy 

## 2022-05-29 ENCOUNTER — Encounter: Payer: Self-pay | Admitting: Registered Nurse

## 2022-09-01 DIAGNOSIS — C61 Malignant neoplasm of prostate: Secondary | ICD-10-CM | POA: Diagnosis not present

## 2022-09-08 DIAGNOSIS — N3041 Irradiation cystitis with hematuria: Secondary | ICD-10-CM | POA: Diagnosis not present

## 2022-09-08 DIAGNOSIS — R351 Nocturia: Secondary | ICD-10-CM | POA: Diagnosis not present

## 2022-09-08 DIAGNOSIS — C61 Malignant neoplasm of prostate: Secondary | ICD-10-CM | POA: Diagnosis not present

## 2022-09-09 ENCOUNTER — Telehealth: Payer: Self-pay

## 2022-09-09 NOTE — Telephone Encounter (Signed)
-----   Message from Wendie Agreste, MD sent at 09/08/2022 10:22 PM EDT ----- Regarding: RE: TOC I saw him in 2017. I will agree to see him as new PCP. Thanks.  Lavone Nian ----- Message ----- From: [name], CMA Sent: 09/08/2022  11:50 AM EDT To: Wendie Agreste, MD Subject: TOC                                            Good morning! This patient came in to update his insurance information and I attempted to help him set up an appt with a new PCP but he wanted me to ask if you would be willing to take him on first.

## 2022-09-09 NOTE — Telephone Encounter (Signed)
Left vm for patient to let him know that Dr Carlota Raspberry agreed to be his new PCP and please call back so that we can get him scheduled. Changed PCP in chart.

## 2022-09-28 ENCOUNTER — Encounter: Payer: Self-pay | Admitting: Family Medicine

## 2022-09-28 ENCOUNTER — Ambulatory Visit: Payer: BC Managed Care – PPO | Admitting: Family Medicine

## 2022-09-28 VITALS — BP 152/76 | HR 82 | Temp 98.8°F | Ht 75.0 in | Wt 302.4 lb

## 2022-09-28 DIAGNOSIS — Z6837 Body mass index (BMI) 37.0-37.9, adult: Secondary | ICD-10-CM

## 2022-09-28 DIAGNOSIS — G4733 Obstructive sleep apnea (adult) (pediatric): Secondary | ICD-10-CM

## 2022-09-28 DIAGNOSIS — I10 Essential (primary) hypertension: Secondary | ICD-10-CM

## 2022-09-28 DIAGNOSIS — N529 Male erectile dysfunction, unspecified: Secondary | ICD-10-CM | POA: Diagnosis not present

## 2022-09-28 DIAGNOSIS — R739 Hyperglycemia, unspecified: Secondary | ICD-10-CM | POA: Diagnosis not present

## 2022-09-28 DIAGNOSIS — E781 Pure hyperglyceridemia: Secondary | ICD-10-CM

## 2022-09-28 DIAGNOSIS — Z789 Other specified health status: Secondary | ICD-10-CM

## 2022-09-28 LAB — HEMOGLOBIN A1C: Hgb A1c MFr Bld: 4.9 % (ref 4.6–6.5)

## 2022-09-28 NOTE — Patient Instructions (Addendum)
No change in meds today. I will check some labs.  Cut back on beer intake - cut back to 1/2 for now with ultimate goal of no more than 2 per day. If you have any difficulty cutting back, please let me know. Beer  can also elevate blood pressure.  Keep a record of your blood pressures outside of the office and bring them to the next office visit. If any return of face swelling, be seen right away. If any concerns on labs, I will let you know. Take care!

## 2022-09-28 NOTE — Progress Notes (Signed)
Subjective:  Patient ID: Bobby Mcguire, male    DOB: 07-23-1966  Age: 56 y.o. MRN: [id number]  CC:  Chief Complaint  Patient presents with   Transitions Of Care    Pt wants to see if he still needs to be on the blood pressure meds and cholesterol  Meds     HPI Kostas D Fettig presents for  Transfer of care, previous primary care provider Maximiano Coss who has left.  I last saw patient in August 2017.  Last physical in January of this year.  Hypertension: With underlying obstructive sleep apnea on CPAP nightly.  Hypertension treated with amlodipine '10mg'$  daily, losartan 100 mg daily, has not yet taken meds today.  Home readings: none. BP 127/72 at urology recently (followed with hx of prostate CA in 2020 - s/p radiation tx).  Beer intake - 6-8 beers per day. 8% beer. Denies addiction or difficulty cutting back if needed.  R face swelling in past resolved with sour candy. Doing ok now - will return if recurs.  BP Readings from Last 3 Encounters:  09/28/22 (!) 144/78  01/07/22 130/70  06/04/21 132/76   Lab Results  Component Value Date   CREATININE 0.92 01/07/2022   Hyperlipidemia: Treated with Crestor 20 mg daily. No new myalgias/side effects.  Last ate 5 hrs ago.  Lab Results  Component Value Date   CHOL 114 01/07/2022   HDL 39.70 01/07/2022   LDLCALC 77 02/19/2020   LDLDIRECT 43.0 01/07/2022   TRIG 304.0 (H) 01/07/2022   CHOLHDL 3 01/07/2022   Lab Results  Component Value Date   ALT 47 01/07/2022   AST 43 (H) 01/07/2022   ALKPHOS 69 01/07/2022   BILITOT 0.6 01/07/2022   Erectile dysfunction: '100mg'$  effective No vision/hearing changes, CP, dyspnea or HA/flushing with med or exertion.    Hyperglycemia on January labs but A1c 5.3 at that time.glucose 153 at that time. Weight has improved. 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  No night sweats, fevers. Good appetite.  Wt Readings from Last 3 Encounters:  09/28/22 (!) 302 lb 6.4 oz  (137.2 kg)  01/07/22 (!) 340 lb 6.4 oz (154.4 kg)  11/09/21 (!) 356 lb (161.5 kg)   Body mass index is 37.8 kg/m.   History Patient Active Problem List   Diagnosis Date Noted   Arm weakness 03/25/2021   Neck pain 03/25/2021   Ulnar neuropathy 10/29/2020   Cervical radiculopathy at C8 07/09/2020   Ganglion of flexor tendon sheath of right thumb 05/21/2020   Saturday night paralysis of right upper extremity 05/21/2020   Wrist arthritis 18/84/1660   Alcoholic peripheral neuropathy ([place]) 04/08/2020   Laryngopharyngeal reflux (LPR) 01/14/2020   Pharyngoesophageal dysphagia 01/14/2020   Malignant neoplasm of prostate ([place]) 08/06/2019   Morbid (severe) obesity due to excess calories ([place]) 02/13/2019   Osteoarthritis of knee 01/09/2018   Erectile dysfunction 09/19/2017   Essential hypertension 06/22/2017   Severe obstructive sleep apnea-hypopnea syndrome 05/07/2010   Past Medical History:  Diagnosis Date   Hypertension    Prostate cancer ([place])    Prostate cancer ([place])    Sleep apnea    Past Surgical History:  Procedure Laterality Date   PROSTATE BIOPSY     Allergies  Allergen Reactions   No Known Allergies    Prior to Admission medications   Medication Sig Start Date End Date Taking? Authorizing Provider  amLODipine (NORVASC) 10 MG tablet Take 1 tablet (10 mg total) by mouth daily.  01/07/22  Yes Maximiano Coss, NP  ibuprofen (ADVIL) 800 MG tablet Take 1 tablet (800 mg total) by mouth 3 (three) times daily as needed. 04/07/22  Yes Maximiano Coss, NP  losartan (COZAAR) 100 MG tablet Take 1 tablet (100 mg total) by mouth daily. 01/07/22  Yes Maximiano Coss, NP  MULTIPLE VITAMINS PO Take by mouth daily.   Yes [provider]  predniSONE (DELTASONE) 10 MG tablet 3 tabs x3 days and then 2 tabs x3 days and then 1 tab x3 days.  Take w/ food. 01/07/22  Yes Maximiano Coss, NP  rosuvastatin (CRESTOR) 20 MG tablet Take 1 tablet (20 mg total) by mouth daily. 01/07/22  Yes Maximiano Coss, NP  sildenafil (VIAGRA) 100 MG tablet Take 0.5-1 tablets (50-100 mg total) by mouth daily as needed for erectile dysfunction. 01/07/22  Yes Maximiano Coss, NP  amLODipine (NORVASC) 5 MG tablet     [provider]  Olopatadine HCl 0.6 % SOLN USE 1 SPRAY IN EACH NOSTRIL IN THE MORNING AND AT BEDTIME Patient not taking: Reported on 09/28/2022 02/03/22   Maximiano Coss, NP  oxymetazoline (AFRIN) 0.05 % nasal spray Place 1 spray into both nostrils 2 (two) times daily. Patient not taking: Reported on 09/28/2022 01/07/22   Maximiano Coss, NP  rosuvastatin (CRESTOR) 10 MG tablet     [provider]   Social History   Socioeconomic History   Marital status: Married    Spouse name: Not on file   Number of children: 4   Years of education: Not on file   Highest education level: Not on file  Occupational History   Not on file  Tobacco Use   Smoking status: Never   Smokeless tobacco: Never  Vaping Use   Vaping Use: Never used  Substance and Sexual Activity   Alcohol use: Yes    Alcohol/week: 3.0 standard drinks of alcohol    Types: 3 Glasses of wine per week    Comment: reports he drinks a beer on occasion   Drug use: No   Sexual activity: Yes  Other Topics Concern   Not on file  Social History Narrative   3 biological and 1 stepchild   Social Determinants of Health   Financial Resource Strain: Not on file  Food Insecurity: Not on file  Transportation Needs: Not on file  Physical Activity: Not on file  Stress: Not on file  Social Connections: Not on file  Intimate Partner Violence: Not on file    Review of Systems  Constitutional:  Negative for fatigue and unexpected weight change.  Eyes:  Negative for visual disturbance.  Respiratory:  Negative for cough, chest tightness and shortness of breath.   Cardiovascular:  Negative for chest pain, palpitations and leg swelling.  Gastrointestinal:  Negative for abdominal pain and blood in stool.   Neurological:  Negative for dizziness, light-headedness and headaches.     Objective:   Vitals:   09/28/22 1357  BP: (!) 144/78  Pulse: 82  Temp: 98.8 F (37.1 C)  SpO2: 98%  Weight: (!) 302 lb 6.4 oz (137.2 kg)  Height: '6\' 3"'$  (1.905 m)     Physical Exam Vitals reviewed.  Constitutional:      Appearance: He is well-developed.  HENT:     Head: Normocephalic and atraumatic.  Neck:     Vascular: No carotid bruit or JVD.  Cardiovascular:     Rate and Rhythm: Normal rate and regular rhythm.     Heart sounds: Normal heart sounds.  No murmur heard. Pulmonary:     Effort: Pulmonary effort is normal.     Breath sounds: Normal breath sounds. No rales.  Musculoskeletal:     Right lower leg: No edema.     Left lower leg: No edema.  Skin:    General: Skin is warm and dry.  Neurological:     Mental Status: He is alert and oriented to person, place, and time.  Psychiatric:        Mood and Affect: Mood normal.      Assessment & Plan:  TAEGEN DELKER is a 56 y.o. male . Essential hypertension  -  Stable, tolerating current regimen. Medications refilled. Labs pending as above.  Decreased alcohol intake discussed.  Resources available if difficulty cutting back.  Home readings for next visit.  Follow-up if any further facial swelling, none seen on exam.  Possible sialadenitis based on history.  Erectile dysfunction, unspecified erectile dysfunction type  - viagra - use lowest effective dose. Side effects discussed (including but not limited to headache/flushing, blue discoloration of vision, possible vascular steal and risk of cardiac effects if underlying unknown coronary artery disease, and permanent sensorineural hearing loss). Understanding expressed.  Hyperglycemia - Plan: Hemoglobin A1c BMI 37.0-37.9, adult -  -Check A1c.  Watch diet, physical activity.  Consistent diet discussed, avoid skipping meals.  OSA on CPAP Continue CPAP.  Hypertriglyceridemia - Plan:  Comprehensive metabolic panel, Lipid panel  -Check labs, medication adjustments accordingly.  Alcohol use  -Decreased use discussed as above with resources if needed if difficulty cutting back.  No orders of the defined types were placed in this encounter.  Patient Instructions  No change in meds today. I will check some labs.  Cut back on beer intake - cut back to 1/2 for now with ultimate goal of no more than 2 per day. If you have any difficulty cutting back, please let me know. Beer  can also elevate blood pressure.  Keep a record of your blood pressures outside of the office and bring them to the next office visit. If any return of face swelling, be seen right away. If any concerns on labs, I will let you know. Take care!       Signed,   [name], MD [place] 09/28/22 2:27 PM

## 2022-09-29 LAB — COMPREHENSIVE METABOLIC PANEL
ALT: 59 U/L — ABNORMAL HIGH (ref 0–53)
AST: 40 U/L — ABNORMAL HIGH (ref 0–37)
Albumin: 4.3 g/dL (ref 3.5–5.2)
Alkaline Phosphatase: 64 U/L (ref 39–117)
BUN: 14 mg/dL (ref 6–23)
CO2: 26 mEq/L (ref 19–32)
Calcium: 9.7 mg/dL (ref 8.4–10.5)
Chloride: 106 mEq/L (ref 96–112)
Creatinine, Ser: 0.91 mg/dL (ref 0.40–1.50)
GFR: 94.34 mL/min (ref >60.00)
Glucose, Bld: 86 mg/dL (ref 70–99)
Potassium: 4.4 mEq/L (ref 3.5–5.1)
Sodium: 142 mEq/L (ref 135–145)
Total Bilirubin: 0.7 mg/dL (ref 0.2–1.2)
Total Protein: 7.7 g/dL (ref 6.0–8.3)

## 2022-09-29 LAB — LIPID PANEL
Cholesterol: 139 mg/dL (ref 0–200)
HDL: 53.6 mg/dL (ref >39.00)
LDL Cholesterol: 55 mg/dL (ref 0–99)
NonHDL: 85.7
Total CHOL/HDL Ratio: 3
Triglycerides: 155 mg/dL — ABNORMAL HIGH (ref 0.0–149.0)
VLDL: 31 mg/dL (ref 0.0–40.0)

## 2022-10-01 ENCOUNTER — Encounter: Payer: Self-pay | Admitting: Family Medicine

## 2022-10-03 ENCOUNTER — Encounter: Payer: Self-pay | Admitting: Family Medicine

## 2022-10-04 NOTE — Telephone Encounter (Signed)
Needs OV to discuss prednisone if refill needed as this is for acute issue typically. Thanks.

## 2022-10-06 ENCOUNTER — Ambulatory Visit: Payer: Self-pay | Admitting: Neurology

## 2022-10-07 ENCOUNTER — Encounter: Payer: Self-pay | Admitting: Orthopaedic Surgery

## 2022-10-12 ENCOUNTER — Encounter: Payer: BC Managed Care – PPO | Admitting: Family Medicine

## 2022-10-24 ENCOUNTER — Encounter: Payer: BC Managed Care – PPO | Admitting: Family Medicine

## 2022-10-31 DIAGNOSIS — G4733 Obstructive sleep apnea (adult) (pediatric): Secondary | ICD-10-CM | POA: Diagnosis not present

## 2022-11-03 ENCOUNTER — Encounter: Payer: BC Managed Care – PPO | Admitting: Family Medicine

## 2023-01-10 ENCOUNTER — Encounter: Payer: 59 | Admitting: Registered Nurse

## 2023-01-19 NOTE — Progress Notes (Signed)
This encounter was created in error - please disregard.

## 2023-01-25 ENCOUNTER — Encounter: Payer: Self-pay | Admitting: Family Medicine

## 2023-01-25 DIAGNOSIS — I10 Essential (primary) hypertension: Secondary | ICD-10-CM

## 2023-01-25 DIAGNOSIS — E785 Hyperlipidemia, unspecified: Secondary | ICD-10-CM

## 2023-01-26 ENCOUNTER — Telehealth: Payer: Self-pay

## 2023-01-26 MED ORDER — ROSUVASTATIN CALCIUM 20 MG PO TABS: 20.0000 mg | ORAL_TABLET | Freq: Every day | ORAL | 0 refills | Status: DC

## 2023-01-26 MED ORDER — AMLODIPINE BESYLATE 10 MG PO TABS: 10.0000 mg | ORAL_TABLET | Freq: Every day | ORAL | 0 refills | Status: AC

## 2023-01-26 MED ORDER — LOSARTAN POTASSIUM 100 MG PO TABS: 100.0000 mg | ORAL_TABLET | Freq: Every day | ORAL | 0 refills | Status: DC

## 2023-01-26 NOTE — Telephone Encounter (Signed)
Yes, I agreed to see him as PCP as his October visit was a transfer of care visit and planned 42-monthfollow-up for physical. Thanks.

## 2023-01-26 NOTE — Telephone Encounter (Signed)
Pt saw you for hypertension and is under the impression you have taken him on as a patient, I do not see mention of this okay in recent phone messages or in last AVS please advise if you are going to be able to take on this patients care.

## 2023-01-26 NOTE — Telephone Encounter (Signed)
Added to patient chart as PCP

## 2023-02-01 ENCOUNTER — Encounter: Payer: BC Managed Care – PPO | Admitting: Family Medicine

## 2023-05-31 ENCOUNTER — Other Ambulatory Visit: Payer: Self-pay | Admitting: Family Medicine

## 2023-05-31 DIAGNOSIS — E785 Hyperlipidemia, unspecified: Secondary | ICD-10-CM

## 2023-06-02 ENCOUNTER — Other Ambulatory Visit: Payer: Self-pay | Admitting: Family Medicine

## 2023-06-02 DIAGNOSIS — I10 Essential (primary) hypertension: Secondary | ICD-10-CM

## 2023-07-19 ENCOUNTER — Other Ambulatory Visit: Payer: Self-pay | Admitting: Family Medicine

## 2023-07-19 DIAGNOSIS — E785 Hyperlipidemia, unspecified: Secondary | ICD-10-CM

## 2023-12-09 ENCOUNTER — Other Ambulatory Visit: Payer: Self-pay | Admitting: Family Medicine

## 2023-12-09 DIAGNOSIS — E785 Hyperlipidemia, unspecified: Secondary | ICD-10-CM

## 2024-02-18 ENCOUNTER — Encounter (HOSPITAL_COMMUNITY): Payer: Self-pay

## 2024-02-18 ENCOUNTER — Emergency Department (HOSPITAL_COMMUNITY)
Admission: EM | Admit: 2024-02-18 | Discharge: 2024-02-18 | Disposition: A | Discharge status: Home / Self Care | Attending: Emergency Medicine | Admitting: Emergency Medicine

## 2024-02-18 ENCOUNTER — Other Ambulatory Visit: Payer: Self-pay

## 2024-02-18 DIAGNOSIS — I10 Essential (primary) hypertension: Secondary | ICD-10-CM | POA: Diagnosis not present

## 2024-02-18 DIAGNOSIS — Z79899 Other long term (current) drug therapy: Secondary | ICD-10-CM | POA: Insufficient documentation

## 2024-02-18 DIAGNOSIS — F1210 Cannabis abuse, uncomplicated: Secondary | ICD-10-CM | POA: Insufficient documentation

## 2024-02-18 DIAGNOSIS — F419 Anxiety disorder, unspecified: Secondary | ICD-10-CM | POA: Diagnosis present

## 2024-02-18 DIAGNOSIS — E876 Hypokalemia: Secondary | ICD-10-CM | POA: Diagnosis not present

## 2024-02-18 DIAGNOSIS — R61 Generalized hyperhidrosis: Secondary | ICD-10-CM | POA: Diagnosis not present

## 2024-02-18 LAB — CBC
HCT: 45.7 % (ref 39.0–52.0)
Hemoglobin: 14.3 g/dL (ref 13.0–17.0)
MCH: 31.4 pg (ref 26.0–34.0)
MCHC: 31.3 g/dL (ref 30.0–36.0)
MCV: 100.4 fL — ABNORMAL HIGH (ref 80.0–100.0)
Platelets: 310 10*3/uL (ref 150–400)
RBC: 4.55 MIL/uL (ref 4.22–5.81)
RDW: 12.2 % (ref 11.5–15.5)
WBC: 14.6 10*3/uL — ABNORMAL HIGH (ref 4.0–10.5)
nRBC: 0 % (ref 0.0–0.2)

## 2024-02-18 LAB — COMPREHENSIVE METABOLIC PANEL
ALT: 28 U/L (ref 0–44)
AST: 24 U/L (ref 15–41)
Albumin: 4.8 g/dL (ref 3.5–5.0)
Alkaline Phosphatase: 59 U/L (ref 38–126)
Anion gap: 13 (ref 5–15)
BUN: 13 mg/dL (ref 6–20)
CO2: 21 mmol/L — ABNORMAL LOW (ref 22–32)
Calcium: 9.6 mg/dL (ref 8.9–10.3)
Chloride: 103 mmol/L (ref 98–111)
Creatinine, Ser: 0.86 mg/dL (ref 0.61–1.24)
GFR, Estimated: >60 mL/min (ref >60)
Glucose, Bld: 137 mg/dL — ABNORMAL HIGH (ref 70–99)
Potassium: 3.2 mmol/L — ABNORMAL LOW (ref 3.5–5.1)
Sodium: 137 mmol/L (ref 135–145)
Total Bilirubin: 1.2 mg/dL (ref 0.0–1.2)
Total Protein: 8.4 g/dL — ABNORMAL HIGH (ref 6.5–8.1)

## 2024-02-18 LAB — RAPID URINE DRUG SCREEN, HOSP PERFORMED
Amphetamines: NOT DETECTED
Barbiturates: NOT DETECTED
Benzodiazepines: NOT DETECTED
Cocaine: NOT DETECTED
Opiates: NOT DETECTED
Tetrahydrocannabinol: POSITIVE — AB

## 2024-02-18 MED ORDER — LORAZEPAM 1 MG PO TABS
1.0000 mg | ORAL_TABLET | Freq: Once | ORAL | Status: AC
Start: 2024-02-18 — End: 2024-02-18
  Administered 2024-02-18: 1 mg via ORAL
  Filled 2024-02-18: qty 1

## 2024-02-18 MED ORDER — LORAZEPAM 1 MG PO TABS: 1.0000 mg | ORAL_TABLET | Freq: Two times a day (BID) | ORAL | 0 refills | Status: AC | PRN

## 2024-02-18 MED ORDER — POTASSIUM CHLORIDE ER 10 MEQ PO TBCR: 10.0000 meq | EXTENDED_RELEASE_TABLET | Freq: Every day | ORAL | 0 refills | Status: AC

## 2024-02-18 NOTE — Discharge Instructions (Addendum)
 Please refrain from using any type of marijuana product or CBD/THC product moving forward.  We gave you a dose of Ativan which can help calm and relax you in the ER tonight.  My hope is that you will begin feeling much better and back to normal tomorrow as the CBD leaves your system.  I prescribed you two more days of Ativan to take "as needed" at home to help calm you down.    However, if you continue to have racing thoughts, hearing strange noises, severe anxiety and jitteriness, or unusual behavior tomorrow - I recommend going to the behavioral center at the address above, or calling in for an appointment.  There is a rare possibility of experiencing "mania" or mental health problems from overusing THC.  This would require specific professional evaluation and treatment.  Your potassium level was also mildly low today at 3.2.  I prescribed supplements to take for the next 10 days.  Please drink plenty of water and stay hydrated at home.

## 2024-02-18 NOTE — ED Provider Notes (Addendum)
 [place] [facility] AT [facility] Provider Note   CSN: [id number] Arrival date & time: 02/18/24  2023     History  Chief Complaint  Patient presents with   Hypertension   Anxiety    Bobby Mcguire is a 58 y.o. male with history of hypertension presented to ED with anxiety and rapid speech.  Patient reports that he has been chronically self-medicating with legal THC for joint pain.  He says he took his last dose on Tuesday 5 days ago but was having "ringing in my head and racing thoughts".  He said he went to a store today and bought a CBD product that he helps would counteract these feelings and he took this, approximately 30 mg, but is still feeling extremely anxious, diaphoretic, sweaty.  He is here in the company of his wife and daughter.  They have noted that he has had rapid speech for the past 4 days.  Patient denies any history of psychiatric illness, psychiatric hospitalization, known history of bipolar disorder or manic disease.  HPI     Home Medications Prior to Admission medications   Medication Sig Start Date End Date Taking? Authorizing Provider  LORazepam (ATIVAN) 1 MG tablet Take 1 tablet (1 mg total) by mouth 2 (two) times daily as needed for up to 4 doses for anxiety. 02/18/24  Yes Kiona Blume, Kermit Balo, MD  potassium chloride (KLOR-CON) 10 MEQ tablet Take 1 tablet (10 mEq total) by mouth daily for 10 days. 02/18/24 02/28/24 Yes Alyxandra Tenbrink, Kermit Balo, MD  amLODipine (NORVASC) 10 MG tablet Take 1 tablet (10 mg total) by mouth daily. 01/26/23   Shade Flood, MD  ibuprofen (ADVIL) 800 MG tablet Take 1 tablet (800 mg total) by mouth 3 (three) times daily as needed. 04/07/22   Janeece Agee, NP  losartan (COZAAR) 100 MG tablet TAKE 1 TABLET(100 MG) BY MOUTH DAILY 12/11/23   Shade Flood, MD  MULTIPLE VITAMINS PO Take by mouth daily.    [provider]  predniSONE (DELTASONE) 10 MG tablet 3 tabs x3 days and then 2 tabs x3 days and then 1 tab x3  days.  Take w/ food. 01/07/22   Janeece Agee, NP  rosuvastatin (CRESTOR) 20 MG tablet TAKE 1 TABLET(20 MG) BY MOUTH DAILY 12/11/23   Shade Flood, MD  sildenafil (VIAGRA) 100 MG tablet Take 0.5-1 tablets (50-100 mg total) by mouth daily as needed for erectile dysfunction. 01/07/22   Janeece Agee, NP      Allergies    No known allergies    Review of Systems   Review of Systems  Physical Exam Updated Vital Signs BP (!) 164/93   Pulse 87   Temp 98.5 F (36.9 C) (Oral)   Resp 18   Ht 6\' 3"  (1.905 m)   Wt (!) 137 kg   SpO2 96%   BMI 37.75 kg/m  Physical Exam Constitutional:      General: He is not in acute distress.    Appearance: He is diaphoretic.     Comments: Rapid speech, mildly diaphoretic  HENT:     Head: Normocephalic and atraumatic.  Eyes:     Conjunctiva/sclera: Conjunctivae normal.     Pupils: Pupils are equal, round, and reactive to light.  Cardiovascular:     Rate and Rhythm: Normal rate and regular rhythm.  Pulmonary:     Effort: Pulmonary effort is normal. No respiratory distress.  Abdominal:     General: There is no distension.  Tenderness: There is no abdominal tenderness.  Skin:    General: Skin is warm.  Neurological:     General: No focal deficit present.     Mental Status: He is alert and oriented to person, place, and time. Mental status is at baseline.  Psychiatric:        Behavior: Behavior normal.        Thought Content: Thought content normal.        Judgment: Judgment normal.     Comments: Speech is rapid, patient does not appear to be responding to internal stimuli on exam     ED Results / Procedures / Treatments   Labs (all labs ordered are listed, but only abnormal results are displayed) Labs Reviewed  COMPREHENSIVE METABOLIC PANEL - Abnormal; Notable for the following components:      Result Value   Potassium 3.2 (*)    CO2 21 (*)    Glucose, Bld 137 (*)    Total Protein 8.4 (*)    All other components within normal  limits  CBC - Abnormal; Notable for the following components:   WBC 14.6 (*)    MCV 100.4 (*)    All other components within normal limits  RAPID URINE DRUG SCREEN, HOSP PERFORMED - Abnormal; Notable for the following components:   Tetrahydrocannabinol POSITIVE (*)    All other components within normal limits    EKG None  Radiology No results found.  Procedures Procedures    Medications Ordered in ED Medications  LORazepam (ATIVAN) tablet 1 mg (1 mg Oral Given 02/18/24 2149)    ED Course/ Medical Decision Making/ A&P                                 Medical Decision Making Amount and/or Complexity of Data Reviewed Labs: ordered.  Risk Prescription drug management.   Patient is here complaining of ringing in ears, rapid speech, anxiety sensation.  This is in the setting of frequent ongoing use of high-dose THC and CBD.  I do suspect that this could be consistent with a toxidrome from either marijuana or an additional substance that may be mixed with these over the counter CBD products.    I reviewed the patient's labs and there are no emergent findings here.  He has a minor leukocytosis and mild hypokalemia.  I do not suspect that he has sepsis or meningitis or a stroke/CVA.  He is not complaining of a headache and does not have encephalopathy.  He does not have additional neurological deficits on exam. He is not requiring an LP or neuroimaging at this time.  Supplemental history is provided by the patient's family.  I do feel that the patient had very rapid speech.  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  He says he is a "very high-energy fast talking person".  When I asked his wife and daughter to correlate this, both of them seem to agree that was the case.  However they both implied that he was speaking a little more rapidly than normal the past few  days.  I cannot exclude the possibility of mania with potential auditory hallucinations in the setting of high-dose marijuana usage.  The patient is functional and oriented, demonstrates insight into his situation, does demonstrate obvious paranoid behavior.  I do not believe he is  requiring an involuntary commitment at this time or an emergent psychiatric hospitalization.  I do think it would be reasonable to treat him with Ativan to help calm him down and have him sleep off the effects remaining of the most recent [facility] product he consumed this evening.  I explained to him and his family that if continues to have the same state of mind to 24 hours he would strongly benefit from a behavioral health evaluation and provided them that information in discharge.  They verbalized understanding.        Final Clinical Impression(s) / ED Diagnoses Final diagnoses:  Anxiety  Tetrahydrocannabinol (THC) use disorder, mild, abuse  Hypertension, unspecified type  Hypokalemia    Rx / DC Orders ED Discharge Orders          Ordered    LORazepam (ATIVAN) 1 MG tablet  2 times daily PRN        02/18/24 2217    potassium chloride (KLOR-CON) 10 MEQ tablet  Daily        02/18/24 2221              Terald Sleeper, MD 02/18/24 2223    Terald Sleeper, MD 02/18/24 2234

## 2024-02-18 NOTE — ED Triage Notes (Signed)
 Pt BIB [facility] from home after trying delta 9 which has made him anxious and more hypertensive than normal.

## 2024-09-19 ENCOUNTER — Ambulatory Visit: Admitting: Neurology

## 2024-09-19 NOTE — Progress Notes (Deleted)
 Bobby Mcguire

## 2024-10-01 ENCOUNTER — Ambulatory Visit: Admitting: Neurology

## 2024-10-08 ENCOUNTER — Telehealth: Payer: Self-pay | Admitting: Neurology

## 2024-10-08 NOTE — Telephone Encounter (Signed)
 This patient has seen [facility] in December 2024 and it looks like they ordered new DME supplies and machine for him.  I need him to understand that the physician that ordered the new CPAP is in [facility] and that's were he has to follow up.   He has not been an active patient here and would not be able to make  appointments without a PCP referral.

## 2024-10-09 NOTE — Telephone Encounter (Signed)
 I called the patient and LVM (ok per DPR) kindly informing pt of Dr Dohmeier's message below regarding his appt for tomorrow. I asked the pt to call us  back or reply to [employer]. Our intention is to cancel the appt. Left office number in message.

## 2024-10-10 ENCOUNTER — Ambulatory Visit: Admitting: Neurology

## 2025-01-16 ENCOUNTER — Encounter: Payer: Self-pay | Admitting: Emergency Medicine

## 2025-01-16 NOTE — Telephone Encounter (Signed)
 No

## 2025-01-16 NOTE — Telephone Encounter (Signed)
 Please advise.

## 2025-02-03 ENCOUNTER — Ambulatory Visit: Admitting: Neurology

## 2025-02-27 ENCOUNTER — Ambulatory Visit: Admitting: Neurology
